# Patient Record
Sex: Female | Born: 1970 | Race: White | Hispanic: No | Marital: Single | Smoking: Current some day smoker
Health system: Southern US, Community
[De-identification: ages and names within clinical notes are randomized; demographics above are authoritative.]

## PROBLEM LIST (undated history)

## (undated) DIAGNOSIS — F32A Depression, unspecified: Secondary | ICD-10-CM

## (undated) DIAGNOSIS — K5792 Diverticulitis of intestine, part unspecified, without perforation or abscess without bleeding: Secondary | ICD-10-CM

## (undated) DIAGNOSIS — S46009A Unspecified injury of muscle(s) and tendon(s) of the rotator cuff of unspecified shoulder, initial encounter: Secondary | ICD-10-CM

## (undated) DIAGNOSIS — N200 Calculus of kidney: Secondary | ICD-10-CM

## (undated) DIAGNOSIS — E079 Disorder of thyroid, unspecified: Secondary | ICD-10-CM

## (undated) DIAGNOSIS — G43909 Migraine, unspecified, not intractable, without status migrainosus: Secondary | ICD-10-CM

## (undated) DIAGNOSIS — F319 Bipolar disorder, unspecified: Secondary | ICD-10-CM

## (undated) DIAGNOSIS — M1990 Unspecified osteoarthritis, unspecified site: Secondary | ICD-10-CM

## (undated) DIAGNOSIS — K219 Gastro-esophageal reflux disease without esophagitis: Secondary | ICD-10-CM

## (undated) DIAGNOSIS — F419 Anxiety disorder, unspecified: Secondary | ICD-10-CM

## (undated) DIAGNOSIS — G4730 Sleep apnea, unspecified: Secondary | ICD-10-CM

## (undated) DIAGNOSIS — M7710 Lateral epicondylitis, unspecified elbow: Secondary | ICD-10-CM

## (undated) DIAGNOSIS — I10 Essential (primary) hypertension: Secondary | ICD-10-CM

## (undated) HISTORY — DX: Diverticulitis of intestine, part unspecified, without perforation or abscess without bleeding: K57.92

## (undated) HISTORY — PX: SLEEVE GASTROPLASTY: SHX1101

## (undated) HISTORY — DX: Calculus of kidney: N20.0

## (undated) HISTORY — DX: Gastro-esophageal reflux disease without esophagitis: K21.9

## (undated) HISTORY — DX: Migraine, unspecified, not intractable, without status migrainosus: G43.909

## (undated) HISTORY — DX: Sleep apnea, unspecified: G47.30

## (undated) HISTORY — DX: Lateral epicondylitis, unspecified elbow: M77.10

## (undated) HISTORY — PX: ABDOMINAL HYSTERECTOMY: SHX81

## (undated) HISTORY — DX: Unspecified injury of muscle(s) and tendon(s) of the rotator cuff of unspecified shoulder, initial encounter: S46.009A

## (undated) HISTORY — PX: COLON SURGERY: SHX602

## (undated) HISTORY — PX: LITHOTRIPSY: SUR834

## (undated) HISTORY — DX: Disorder of thyroid, unspecified: E07.9

## (undated) HISTORY — DX: Unspecified osteoarthritis, unspecified site: M19.90

---

## 2016-01-09 DIAGNOSIS — F319 Bipolar disorder, unspecified: Secondary | ICD-10-CM | POA: Insufficient documentation

## 2016-03-28 DIAGNOSIS — Z9049 Acquired absence of other specified parts of digestive tract: Secondary | ICD-10-CM | POA: Insufficient documentation

## 2016-03-28 HISTORY — DX: Acquired absence of other specified parts of digestive tract: Z90.49

## 2016-05-02 DIAGNOSIS — Z860101 Personal history of adenomatous and serrated colon polyps: Secondary | ICD-10-CM

## 2016-05-02 DIAGNOSIS — Z86010 Personal history of colonic polyps: Secondary | ICD-10-CM

## 2016-05-02 HISTORY — DX: Personal history of adenomatous and serrated colon polyps: Z86.0101

## 2016-05-02 HISTORY — DX: Personal history of colonic polyps: Z86.010

## 2016-05-24 DIAGNOSIS — G4733 Obstructive sleep apnea (adult) (pediatric): Secondary | ICD-10-CM | POA: Insufficient documentation

## 2017-02-02 ENCOUNTER — Encounter: Payer: Self-pay | Admitting: Family Medicine

## 2017-02-02 ENCOUNTER — Ambulatory Visit (INDEPENDENT_AMBULATORY_CARE_PROVIDER_SITE_OTHER): Payer: Medicaid Other | Admitting: Family Medicine

## 2017-02-02 DIAGNOSIS — F319 Bipolar disorder, unspecified: Secondary | ICD-10-CM | POA: Diagnosis not present

## 2017-02-02 DIAGNOSIS — E66811 Obesity, class 1: Secondary | ICD-10-CM

## 2017-02-02 DIAGNOSIS — E669 Obesity, unspecified: Secondary | ICD-10-CM

## 2017-02-02 DIAGNOSIS — G43909 Migraine, unspecified, not intractable, without status migrainosus: Secondary | ICD-10-CM | POA: Insufficient documentation

## 2017-02-02 DIAGNOSIS — K219 Gastro-esophageal reflux disease without esophagitis: Secondary | ICD-10-CM | POA: Insufficient documentation

## 2017-02-02 DIAGNOSIS — G43009 Migraine without aura, not intractable, without status migrainosus: Secondary | ICD-10-CM | POA: Diagnosis not present

## 2017-02-02 DIAGNOSIS — F3341 Major depressive disorder, recurrent, in partial remission: Secondary | ICD-10-CM | POA: Insufficient documentation

## 2017-02-02 DIAGNOSIS — G4700 Insomnia, unspecified: Secondary | ICD-10-CM | POA: Insufficient documentation

## 2017-02-02 DIAGNOSIS — E039 Hypothyroidism, unspecified: Secondary | ICD-10-CM | POA: Diagnosis not present

## 2017-02-02 DIAGNOSIS — F324 Major depressive disorder, single episode, in partial remission: Secondary | ICD-10-CM | POA: Insufficient documentation

## 2017-02-02 HISTORY — DX: Obesity, unspecified: E66.9

## 2017-02-02 HISTORY — DX: Obesity, class 1: E66.811

## 2017-02-02 HISTORY — DX: Hypothyroidism, unspecified: E03.9

## 2017-02-02 NOTE — Progress Notes (Signed)
BP 125/89   Pulse 78   Temp 98.9 F (37.2 C) (Oral)   Ht 5' 7.5" (1.715 m)   Wt 218 lb (98.9 kg)   BMI 33.64 kg/m    Subjective:    Patient ID: Pam Chen, female    DOB: 09/24/1970, 46 y.o.   MRN: [id number]  HPI: Pam Chen is a 46 y.o. female presenting on 02/02/2017 for Establish Care   HPI Hypothyroidism  Patient is coming in to establish with our office Patient is coming in for thyroid check today as well. They deny any issues with hair changes or heat or cold problems or diarrhea or constipation. They deny any chest pain or palpitations. They are currently on levothyroxine 75 micrograms   GERD Patient is currently on omeprazole 20 twice a day and says it controls pretty well.  She denies any major symptoms or abdominal pain or belching or burping. She denies any blood in her stool or lightheadedness or dizziness.   Migraines without aura Patient is coming in to establish care with Dr. office and has a history of migraines and currently takes Topamax 50 and uses Maxalt for breakthrough. She says for the most part that works but occasionally it does cause her a lot more issues and she needs a shot of Toradol. She just recently got one yesterday and is recovering from that and seems to be doing well.  Bipolar and depression Patient has bipolar depression and [facility] [place] and is currently on Wellbutrin and seems to be doing well on it. She does feel little more depressed and stressed right now with a recent move to here and dealing with some family changes. She denies any suicidal ideations or thoughts of hurting herself but does have thoughts of death. Depression screen PHQ 2/9 02/02/2017  Decreased Interest 2  Down, Depressed, Hopeless 2  PHQ - 2 Score 4  Altered sleeping 2  Tired, decreased energy 2  Change in appetite 2  Feeling bad or failure about yourself  3  Trouble concentrating 1  Moving slowly or fidgety/restless 1  Suicidal  thoughts 2  PHQ-9 Score 17  Difficult doing work/chores Somewhat difficult    Relevant past medical, surgical, family and social history reviewed and updated as indicated. Interim medical history since our last visit reviewed. Allergies and medications reviewed and updated.  Review of Systems  Constitutional: Negative for chills and fever.  HENT: Negative for congestion, ear discharge and ear pain.   Eyes: Negative for redness and visual disturbance.  Respiratory: Negative for chest tightness and shortness of breath.   Cardiovascular: Negative for chest pain and leg swelling.  Gastrointestinal: Negative for abdominal pain and nausea.  Genitourinary: Negative for difficulty urinating and dysuria.  Musculoskeletal: Negative for back pain and gait problem.  Skin: Negative for rash.  Neurological: Positive for headaches. Negative for light-headedness.  Psychiatric/Behavioral: Positive for decreased concentration, dysphoric mood and sleep disturbance. Negative for agitation, behavioral problems, self-injury and suicidal ideas. The patient is nervous/anxious.   All other systems reviewed and are negative.   Per HPI unless specifically indicated above  Social History   Social History  . Marital status: Single    Spouse name: N/A  . Number of children: N/A  . Years of education: N/A   Occupational History  . Not on file.   Social History Main Topics  . Smoking status: Current Every Day Smoker    Packs/day: 0.25    Types: Cigarettes  .  Smokeless tobacco: Never Used  . Alcohol use No  . Drug use: No  . Sexual activity: Not on file   Other Topics Concern  . Not on file   Social History Narrative  . No narrative on file    Past Surgical History:  Procedure Laterality Date  . ABDOMINAL HYSTERECTOMY    . COLON SURGERY     diverticulitis  . LITHOTRIPSY    . SLEEVE GASTROPLASTY      Family History  Problem Relation Age of Onset  . Hypertension Father   . Sleep apnea  Father   . Cancer Maternal Grandmother        breast  . Alzheimer's disease Maternal Grandmother   . Diabetes Maternal Grandfather   . Cancer Paternal Grandmother        ovarian  . Hypertension Paternal Grandfather   . Heart disease Paternal Grandfather   . Diabetes Paternal Grandfather   . Hyperlipidemia Brother     Allergies as of 02/02/2017      Reactions   Aspirin Other (See Comments)   Upset tummy.  Can take ibuprofen.   Oxycodone-acetaminophen Anxiety   Can take vicodin      Medication List       Accurate as of 02/02/17  2:46 PM. Always use your most recent med list.          buPROPion 300 MG 24 hr tablet Commonly known as:  WELLBUTRIN XL Take 300 mg by mouth daily.   cyclobenzaprine 10 MG tablet Commonly known as:  FLEXERIL Take 10 mg by mouth 2 (two) times daily.   HM VITAMIN D3 4000 units Caps Generic drug:  Cholecalciferol Take 4,000 Units by mouth daily.   HYDROcodone-acetaminophen 5-325 MG tablet Commonly known as:  NORCO/VICODIN Take 1 tablet by mouth every 8 (eight) hours as needed for moderate pain.   levothyroxine 75 MCG tablet Commonly known as:  SYNTHROID, LEVOTHROID Take 75 mcg by mouth daily.   multivitamin with minerals Tabs tablet Take 1 tablet by mouth daily.   omeprazole 20 MG capsule Commonly known as:  PRILOSEC Take 20 mg by mouth 2 (two) times daily before a meal.   promethazine 12.5 MG tablet Commonly known as:  PHENERGAN Take 12.5 mg by mouth every 6 (six) hours as needed for nausea or vomiting.   rizatriptan 10 MG tablet Commonly known as:  MAXALT Take 10 mg by mouth as needed for migraine. May repeat in 2 hours if needed   topiramate 50 MG tablet Commonly known as:  TOPAMAX Take 100 mg by mouth daily.          Objective:    BP 125/89   Pulse 78   Temp 98.9 F (37.2 C) (Oral)   Ht 5' 7.5" (1.715 m)   Wt 218 lb (98.9 kg)   BMI 33.64 kg/m   Wt Readings from Last 3 Encounters:  02/02/17 218 lb (98.9 kg)      Physical Exam  Constitutional: She is oriented to person, place, and time. She appears well-developed and well-nourished. No distress.  Eyes: Conjunctivae are normal.  Neck: Neck supple. No thyromegaly present.  Cardiovascular: Normal rate, regular rhythm, normal heart sounds and intact distal pulses.   No murmur heard. Pulmonary/Chest: Effort normal and breath sounds normal. No respiratory distress. She has no wheezes. She has no rales.  Abdominal: Soft. Bowel sounds are normal. She exhibits no distension. There is no tenderness. There is no rebound.  Musculoskeletal: Normal range of motion. She exhibits  no edema.  Lymphadenopathy:    She has no cervical adenopathy.  Neurological: She is alert and oriented to person, place, and time. Coordination normal.  Skin: Skin is warm and dry. No rash noted. She is not diaphoretic.  Psychiatric: Her behavior is normal. Her mood appears anxious. She exhibits a depressed mood. She expresses no suicidal ideation. She expresses no suicidal plans.  Nursing note and vitals reviewed.   No results found for this or any previous visit.    Assessment & Plan:   Problem List Items Addressed This Visit      Cardiovascular and Mediastinum   Migraine headache without aura   Relevant Medications   topiramate (TOPAMAX) 50 MG tablet   buPROPion (WELLBUTRIN XL) 300 MG 24 hr tablet   rizatriptan (MAXALT) 10 MG tablet   HYDROcodone-acetaminophen (NORCO/VICODIN) 5-325 MG tablet   cyclobenzaprine (FLEXERIL) 10 MG tablet     Digestive   GERD (gastroesophageal reflux disease)   Relevant Medications   omeprazole (PRILOSEC) 20 MG capsule     Endocrine   Hypothyroid   Relevant Medications   levothyroxine (SYNTHROID, LEVOTHROID) 75 MCG tablet     Other   Obesity (BMI 30.0-34.9)   Bipolar 1 disorder, depressed (HCC)   Relevant Medications   buPROPion (WELLBUTRIN XL) 300 MG 24 hr tablet       Follow up plan: Return in about 4 weeks (around 03/02/2017), or  if symptoms worsen or fail to improve, for thyroid.  Arville Care, MD [facility] 02/02/2017, 2:46 PM

## 2017-02-06 ENCOUNTER — Telehealth: Payer: Self-pay | Admitting: Family Medicine

## 2017-02-06 NOTE — Telephone Encounter (Signed)
Patient aware [facility] [place]

## 2017-02-08 ENCOUNTER — Other Ambulatory Visit: Payer: Self-pay | Admitting: Family Medicine

## 2017-02-08 MED ORDER — RIZATRIPTAN BENZOATE 10 MG PO TABS: 10.0000 mg | ORAL_TABLET | ORAL | 2 refills | Status: DC | PRN

## 2017-02-08 MED ORDER — PROMETHAZINE HCL 12.5 MG PO TABS: 12.5000 mg | ORAL_TABLET | Freq: Four times a day (QID) | ORAL | 2 refills | Status: DC | PRN

## 2017-02-08 NOTE — Telephone Encounter (Signed)
Pt notified of RXs 

## 2017-02-08 NOTE — Telephone Encounter (Signed)
Please review and advise.

## 2017-03-02 ENCOUNTER — Encounter: Payer: Self-pay | Admitting: Family Medicine

## 2017-03-02 ENCOUNTER — Telehealth: Payer: Self-pay | Admitting: Family Medicine

## 2017-03-02 ENCOUNTER — Ambulatory Visit (INDEPENDENT_AMBULATORY_CARE_PROVIDER_SITE_OTHER): Payer: Medicaid Other | Admitting: Family Medicine

## 2017-03-02 VITALS — BP 128/87 | HR 88 | Temp 98.0°F | Ht 67.5 in | Wt 223.0 lb

## 2017-03-02 DIAGNOSIS — F1120 Opioid dependence, uncomplicated: Secondary | ICD-10-CM | POA: Diagnosis not present

## 2017-03-02 DIAGNOSIS — Z79899 Other long term (current) drug therapy: Secondary | ICD-10-CM | POA: Diagnosis not present

## 2017-03-02 DIAGNOSIS — E039 Hypothyroidism, unspecified: Secondary | ICD-10-CM | POA: Diagnosis not present

## 2017-03-02 DIAGNOSIS — Z23 Encounter for immunization: Secondary | ICD-10-CM

## 2017-03-02 DIAGNOSIS — Z13220 Encounter for screening for lipoid disorders: Secondary | ICD-10-CM | POA: Diagnosis not present

## 2017-03-02 MED ORDER — HYDROCODONE-ACETAMINOPHEN 5-325 MG PO TABS: 1.0000 | ORAL_TABLET | Freq: Every day | ORAL | 0 refills | Status: DC | PRN

## 2017-03-02 MED ORDER — CYCLOBENZAPRINE HCL 10 MG PO TABS
10.0000 mg | ORAL_TABLET | Freq: Two times a day (BID) | ORAL | 5 refills | Status: DC
Start: 2017-03-02 — End: 2017-10-02

## 2017-03-02 NOTE — Progress Notes (Signed)
[place] Controlled Substance Abuse database reviewed- Yes If yes- were their any concerning findings : no Depression screen [facility] 2/9 03/02/2017 02/02/2017  Decreased Interest 1 2  Down, Depressed, Hopeless 2 2  PHQ - 2 Score 3 4  Altered sleeping 2 2  Tired, decreased energy 2 2  Change in appetite 1 2  Feeling bad or failure about yourself  2 3  Trouble concentrating 0 1  Moving slowly or fidgety/restless 0 1  Suicidal thoughts 1 2  PHQ-9 Score 11 17  Difficult doing work/chores Somewhat difficult Somewhat difficult    No flowsheet data found.   [employer] drug screen performed- Yes  SOAPP  0= never  1= seldom  2=sometimes  3= often  4= very often  How often do you have mood swings? 4 How often do you smoke a cigarette within an hour after waling up? 1 How often have you taken medication other than the way that it was prescribed?1 How often have you used illegal drugs in the past 5 years? 0 How often, in your lifetime, have you had legal problems or been arrested? 0  Score 6  Alcohol Audit - How often during the last year have found that you: 0-Never   1- Less than monthly   2- Monthly     3-Weekly     4-daily or almost daily  - found that you were not able to stop drinking once you started- 0 -failed to do what was normally expected of you because of drinking- 0 -needed a first drink in the morning- 0 -had a feeling of guilt or remorse after drinking- 0 -are/were unable to remember what happened the night before because of your drinking- 0  0- NO   2- yes but not in last year  4- yes during last year -Have you or someone else been injured because of your drinking- 0 - Has anyone been concerned about your drinking or suggested you cut down- 0        TOTAL- 0  ( 0-7- alcohol education, 8-15- simple advice, 16-19 simple advice plus counseling, 20-40 referral for evaluation and treatment 0   [facility] [place]  Pain assessment: Cause of pain- lower  back, degenerative Pain location- low back Pain on scale of 1-10- 2 Frequency- every other day What increases pain-movement and stress What makes pain Better-medication and stretching Effects on ADL - sometimes but not all of the time  Prior treatments tried and failed- therapy, chiropracter Current treatments- hydrocodone Morphine mg equivalent- 5  Pain management agreement reviewed and signed- Yes

## 2017-03-02 NOTE — Telephone Encounter (Signed)
Please advise 

## 2017-03-02 NOTE — Telephone Encounter (Signed)
Notified of RX

## 2017-03-03 LAB — CBC WITH DIFFERENTIAL/PLATELET
BASOS ABS: 0 10*3/uL (ref 0.0–0.2)
Basos: 0 %
EOS (ABSOLUTE): 0.1 10*3/uL (ref 0.0–0.4)
EOS: 1 %
HEMATOCRIT: 41.6 % (ref 34.0–46.6)
Hemoglobin: 13.2 g/dL (ref 11.1–15.9)
IMMATURE GRANS (ABS): 0 10*3/uL (ref 0.0–0.1)
IMMATURE GRANULOCYTES: 0 %
LYMPHS: 26 %
Lymphocytes Absolute: 2 10*3/uL (ref 0.7–3.1)
MCH: 28.8 pg (ref 26.6–33.0)
MCHC: 31.7 g/dL (ref 31.5–35.7)
MCV: 91 fL (ref 79–97)
Monocytes Absolute: 0.5 10*3/uL (ref 0.1–0.9)
Monocytes: 7 %
NEUTROS PCT: 66 %
Neutrophils Absolute: 5.3 10*3/uL (ref 1.4–7.0)
Platelets: 258 10*3/uL (ref 150–379)
RBC: 4.59 x10E6/uL (ref 3.77–5.28)
RDW: 13.5 % (ref 12.3–15.4)
WBC: 8 10*3/uL (ref 3.4–10.8)

## 2017-03-03 LAB — CMP14+EGFR
A/G RATIO: 1.5 (ref 1.2–2.2)
ALT: 13 IU/L (ref 0–32)
AST: 15 IU/L (ref 0–40)
Albumin: 4 g/dL (ref 3.5–5.5)
Alkaline Phosphatase: 80 IU/L (ref 39–117)
BILIRUBIN TOTAL: 0.2 mg/dL (ref 0.0–1.2)
BUN/Creatinine Ratio: 18 (ref 9–23)
BUN: 15 mg/dL (ref 6–24)
CHLORIDE: 101 mmol/L (ref 96–106)
CO2: 29 mmol/L (ref 20–29)
Calcium: 9.8 mg/dL (ref 8.7–10.2)
Creatinine, Ser: 0.85 mg/dL (ref 0.57–1.00)
GFR calc non Af Amer: 84 mL/min/{1.73_m2} (ref >59)
GFR, EST AFRICAN AMERICAN: 96 mL/min/{1.73_m2} (ref >59)
GLUCOSE: 74 mg/dL (ref 65–99)
Globulin, Total: 2.6 g/dL (ref 1.5–4.5)
POTASSIUM: 4.9 mmol/L (ref 3.5–5.2)
SODIUM: 139 mmol/L (ref 134–144)
TOTAL PROTEIN: 6.6 g/dL (ref 6.0–8.5)

## 2017-03-03 LAB — LIPID PANEL
Chol/HDL Ratio: 5.4 ratio — ABNORMAL HIGH (ref 0.0–4.4)
Cholesterol, Total: 228 mg/dL — ABNORMAL HIGH (ref 100–199)
HDL: 42 mg/dL (ref >39)
LDL Calculated: 143 mg/dL — ABNORMAL HIGH (ref 0–99)
Triglycerides: 215 mg/dL — ABNORMAL HIGH (ref 0–149)
VLDL Cholesterol Cal: 43 mg/dL — ABNORMAL HIGH (ref 5–40)

## 2017-03-03 LAB — TSH: TSH: 2.62 u[IU]/mL (ref 0.450–4.500)

## 2017-03-07 ENCOUNTER — Encounter: Payer: Self-pay | Admitting: Family

## 2017-03-07 ENCOUNTER — Ambulatory Visit (INDEPENDENT_AMBULATORY_CARE_PROVIDER_SITE_OTHER): Payer: Medicaid Other | Admitting: Family

## 2017-03-07 VITALS — BP 122/84 | HR 77 | Temp 98.6°F | Ht 67.5 in | Wt 222.2 lb

## 2017-03-07 DIAGNOSIS — E039 Hypothyroidism, unspecified: Secondary | ICD-10-CM

## 2017-03-07 DIAGNOSIS — F17200 Nicotine dependence, unspecified, uncomplicated: Secondary | ICD-10-CM

## 2017-03-07 DIAGNOSIS — Z01419 Encounter for gynecological examination (general) (routine) without abnormal findings: Secondary | ICD-10-CM

## 2017-03-07 DIAGNOSIS — Z0000 Encounter for general adult medical examination without abnormal findings: Secondary | ICD-10-CM

## 2017-03-07 DIAGNOSIS — E669 Obesity, unspecified: Secondary | ICD-10-CM

## 2017-03-07 DIAGNOSIS — K219 Gastro-esophageal reflux disease without esophagitis: Secondary | ICD-10-CM

## 2017-03-07 HISTORY — DX: Nicotine dependence, unspecified, uncomplicated: F17.200

## 2017-03-07 MED ORDER — LEVOTHYROXINE SODIUM 75 MCG PO TABS: 75.0000 ug | ORAL_TABLET | Freq: Every day | ORAL | 2 refills | Status: DC

## 2017-03-07 MED ORDER — OMEPRAZOLE 20 MG PO CPDR: 20.0000 mg | DELAYED_RELEASE_CAPSULE | Freq: Two times a day (BID) | ORAL | 1 refills | Status: DC

## 2017-03-07 NOTE — Progress Notes (Signed)
Subjective:    Patient ID: Pam Chen, female    DOB: May 13, 1971, 46 y.o.   MRN: [id number]  Pt presents to the office today for CPE with PAP. Pt is followed by Dr. Louanne Skye for her chronic problems and had lab work on drawn on 03/02/17.  Gynecologic Exam  The patient's pertinent negatives include no genital itching, genital odor, missed menses or vaginal discharge. The patient is experiencing no pain. Associated symptoms include constipation and frequency. Pertinent negatives include no back pain, diarrhea, flank pain, urgency or vomiting. She has tried nothing for the symptoms.  Thyroid Problem  Presents for follow-up visit. Symptoms include constipation. Patient reports no anxiety, diarrhea, fatigue or hoarse voice. The symptoms have been stable.  Gastroesophageal Reflux  She complains of heartburn. She reports no belching, no coughing or no hoarse voice. This is a chronic problem. The current episode started more than 1 year ago. The problem occurs occasionally. The problem has been waxing and waning. The heartburn duration is several minutes. The heartburn does not wake her from sleep. Pertinent negatives include no fatigue. She has tried a PPI for the symptoms. The treatment provided moderate relief.      Review of Systems  Constitutional: Negative for fatigue.  HENT: Negative for hoarse voice.   Respiratory: Negative for cough.   Gastrointestinal: Positive for constipation and heartburn. Negative for diarrhea and vomiting.  Genitourinary: Positive for frequency. Negative for flank pain, missed menses, urgency and vaginal discharge.  Musculoskeletal: Negative for back pain.  Psychiatric/Behavioral: The patient is not nervous/anxious.   All other systems reviewed and are negative.      Objective:   Physical Exam  Constitutional: She is oriented to person, place, and time. She appears well-developed and well-nourished. No distress.  HENT:  Head: Normocephalic and atraumatic.    Right Ear: External ear normal.  Left Ear: External ear normal.  Nose: Nose normal.  Mouth/Throat: Oropharynx is clear and moist.  Eyes: Pupils are equal, round, and reactive to light.  Neck: Normal range of motion. Neck supple. No thyromegaly present.  Cardiovascular: Normal rate, regular rhythm, normal heart sounds and intact distal pulses.   No murmur heard. Pulmonary/Chest: Effort normal and breath sounds normal. No respiratory distress. She has no wheezes. Right breast exhibits no inverted nipple, no mass, no nipple discharge, no skin change and no tenderness. Left breast exhibits no inverted nipple, no mass, no nipple discharge, no skin change and no tenderness. Breasts are symmetrical.  Abdominal: Soft. Bowel sounds are normal. She exhibits no distension. There is no tenderness.  Genitourinary: Vagina normal.  Genitourinary Comments: Bimanual exam- no adnexal masses or tenderness, ovaries nonpalpable   Cervix not present- No discharge  Musculoskeletal: Normal range of motion. She exhibits no edema or tenderness.  Neurological: She is alert and oriented to person, place, and time.  Skin: Skin is warm and dry.  Psychiatric: She has a normal mood and affect. Her behavior is normal. Judgment and thought content normal.  Vitals reviewed.     Temp 98.6 F (37 C) (Oral)   Ht 5' 7.5" (1.715 m)   Wt 222 lb 3.2 oz (100.8 kg)   BMI 34.29 kg/m      Assessment & Plan:  1. Annual physical exam - Pap IG, CT/NG w/ reflex HPV when ASC-U  2. Gastroesophageal reflux disease without esophagitis - omeprazole (PRILOSEC) 20 MG capsule; Take 1 capsule (20 mg total) by mouth 2 (two) times daily before a meal.  Dispense: 90 capsule; Refill:  1  3. Acquired hypothyroidism - levothyroxine (SYNTHROID, LEVOTHROID) 75 MCG tablet; Take 1 tablet (75 mcg total) by mouth daily.  Dispense: 90 tablet; Refill: 2  4. Obesity (BMI 30.0-34.9)  5. Current smoker Smoking cessation discussed  6. Encounter  for gynecological examination without abnormal finding - Pap IG, CT/NG w/ reflex HPV when ASC-U   Continue all meds Labs pending Health Maintenance reviewed- Mammogram scheduled  Diet and exercise encouraged RTO as needed and keep follow up with PCP  Jannifer Rodney, FNP

## 2017-03-07 NOTE — Patient Instructions (Signed)

## 2017-03-08 LAB — TOXASSURE SELECT 13 (MW), URINE

## 2017-03-09 LAB — PAP IG, CT-NG, RFX HPV ASCU
CHLAMYDIA, NUC. ACID AMP: NEGATIVE
GONOCOCCUS BY NUCLEIC ACID AMP: NEGATIVE
PAP SMEAR COMMENT: 0

## 2017-03-15 ENCOUNTER — Telehealth: Payer: Self-pay | Admitting: Family Medicine

## 2017-03-15 NOTE — Telephone Encounter (Signed)
What is the name of the medication? rizatriptan (MAXALT) 10 MG tablet topiramate (TOPAMAX) 50 MG tablet promethazine (PHENERGAN) 12.5 MG tablet  Have you contacted your pharmacy to request a refill? no  [facility] [place]   Patient notified that their request is being sent to the clinical staff for review and that they should receive a call once it is complete. If they do not receive a call within 24 hours they can check with their pharmacy or our office.

## 2017-03-19 MED ORDER — PROMETHAZINE HCL 12.5 MG PO TABS
12.5000 mg | ORAL_TABLET | Freq: Four times a day (QID) | ORAL | 2 refills | Status: DC | PRN
Start: 2017-03-19 — End: 2017-07-13

## 2017-03-19 MED ORDER — RIZATRIPTAN BENZOATE 10 MG PO TABS: 10.0000 mg | ORAL_TABLET | ORAL | 2 refills | Status: DC | PRN

## 2017-03-19 MED ORDER — TOPIRAMATE 50 MG PO TABS: 100.0000 mg | ORAL_TABLET | Freq: Every day | ORAL | 2 refills | Status: DC

## 2017-03-19 NOTE — Telephone Encounter (Signed)
Go ahead and send refills for 3 months

## 2017-03-19 NOTE — Telephone Encounter (Signed)
Done

## 2017-05-01 ENCOUNTER — Encounter: Payer: Self-pay | Admitting: Family Medicine

## 2017-05-01 ENCOUNTER — Telehealth: Payer: Self-pay | Admitting: Family Medicine

## 2017-05-01 ENCOUNTER — Ambulatory Visit: Payer: Medicaid Other | Admitting: Family Medicine

## 2017-05-01 VITALS — BP 130/85 | HR 99 | Temp 98.2°F | Ht 67.5 in | Wt 226.0 lb

## 2017-05-01 DIAGNOSIS — E785 Hyperlipidemia, unspecified: Secondary | ICD-10-CM | POA: Insufficient documentation

## 2017-05-01 DIAGNOSIS — N3281 Overactive bladder: Secondary | ICD-10-CM | POA: Diagnosis not present

## 2017-05-01 NOTE — Telephone Encounter (Signed)
[facility] called to make sure you aware that this patient was prescribed Diazepam 5 mg, one tablet tid, #90, with no refills on 04/23/17 by Dr. [facility]re[facility].

## 2017-05-01 NOTE — Progress Notes (Signed)
   BP 130/85   Pulse 99   Temp 98.2 F (36.8 C) (Oral)   Ht 5' 7.5" (1.715 m)   Wt 226 lb (102.5 kg)   BMI 34.87 kg/m    Subjective:    Patient ID: Pam Chen, female    DOB: 01/31/1971, [age] y.o.   MRN: [id number]  HPI: Pam Chen is a 46 y.o. female presenting on 05/01/2017 for Medication Refill (Hydrocodone)   HPI Bladder incontinence and frequency issues Patient has tried medications previously but has been quite some time and she cannot recall any recently that she has tried.  This is been an issue that she is been fighting with for years.  She denies any burning or pain with urination or vaginal itching or discharge.  She denies any odor or hematuria.  She just has some leakage and frequency.  She also complains of some urgency as well.  Relevant past medical, surgical, family and social history reviewed and updated as indicated. Interim medical history since our last visit reviewed. Allergies and medications reviewed and updated.  Review of Systems  Constitutional: Negative for chills and fever.  Eyes: Negative for visual disturbance.  Respiratory: Negative for chest tightness and shortness of breath.   Cardiovascular: Negative for chest pain and leg swelling.  Gastrointestinal: Negative for abdominal pain.  Genitourinary: Positive for frequency and urgency. Negative for decreased urine volume, difficulty urinating, dysuria, flank pain, hematuria, vaginal bleeding, vaginal discharge and vaginal pain.  Musculoskeletal: Negative for back pain and gait problem.  Skin: Negative for rash.  Neurological: Negative for light-headedness and headaches.  Psychiatric/Behavioral: Negative for agitation and behavioral problems.  All other systems reviewed and are negative.   Per HPI unless specifically indicated above    Objective:    BP 130/85   Pulse 99   Temp 98.2 F (36.8 C) (Oral)   Ht 5' 7.5" (1.715 m)   Wt 226 lb (102.5 kg)   BMI 34.87 kg/m   Wt Readings from Last 3  Encounters:  05/01/17 226 lb (102.5 kg)  03/07/17 222 lb 3.2 oz (100.8 kg)  03/02/17 223 lb (101.2 kg)    Physical Exam  Constitutional: She is oriented to person, place, and time. She appears well-developed and well-nourished. No distress.  Eyes: Conjunctivae are normal.  Cardiovascular: Normal rate, regular rhythm, normal heart sounds and intact distal pulses.  No murmur heard. Pulmonary/Chest: Effort normal and breath sounds normal. No respiratory distress. She has no wheezes. She has no rales.  Abdominal: Soft. Bowel sounds are normal. She exhibits no distension. There is no tenderness. There is no rebound and no guarding.  Musculoskeletal: Normal range of motion.  Neurological: She is alert and oriented to person, place, and time. Coordination normal.  Skin: Skin is warm and dry. No rash noted. She is not diaphoretic.  Psychiatric: She has a normal mood and affect. Her behavior is normal.  Nursing note and vitals reviewed.       Assessment & Plan:   Problem List Items Addressed This Visit    None    Visit Diagnoses    Overactive bladder    -  Primary   Gave samples for Myrbetriq       Follow up plan: Return if symptoms worsen or fail to improve.  Counseling provided for all of the vaccine components No orders of the defined types were placed in this encounter.   Arville CareJoshua [name], MD [facility] 05/01/2017, 9:28 AM

## 2017-05-02 ENCOUNTER — Telehealth: Payer: Self-pay | Admitting: Family Medicine

## 2017-05-02 ENCOUNTER — Other Ambulatory Visit: Payer: Self-pay | Admitting: Family Medicine

## 2017-05-02 NOTE — Telephone Encounter (Signed)
I called and talked to the pharmacy and it should be authorized now for her

## 2017-05-02 NOTE — Telephone Encounter (Signed)
I called the pharmacy and clarified and they will release 1 of the held prescription that they have there for her.

## 2017-05-02 NOTE — Telephone Encounter (Signed)
Pt has called and states she is a lot of pain and is needing dr Museum/gallery exhibitions officerdettinger to approve the medication

## 2017-05-02 NOTE — Telephone Encounter (Signed)
Okay thanks we will discuss at her next visit the risks of taking both, I think we darty discussed this once.

## 2017-05-16 ENCOUNTER — Other Ambulatory Visit: Payer: Self-pay | Admitting: Family Medicine

## 2017-05-31 ENCOUNTER — Telehealth: Payer: Self-pay | Admitting: Family Medicine

## 2017-05-31 NOTE — Telephone Encounter (Signed)
Pt is out of her hydrocodone Did take a Maxalt this morning which did not help, will repeat If this has not helped she has been instructed to call back to make an appt d/t to her pain contract and her last appt was 3 months ago

## 2017-06-07 ENCOUNTER — Other Ambulatory Visit: Payer: Self-pay | Admitting: Family

## 2017-06-07 DIAGNOSIS — K219 Gastro-esophageal reflux disease without esophagitis: Secondary | ICD-10-CM

## 2017-06-12 ENCOUNTER — Ambulatory Visit: Payer: Medicaid Other | Admitting: Family

## 2017-06-12 ENCOUNTER — Encounter: Payer: Self-pay | Admitting: Family

## 2017-06-12 VITALS — BP 139/87 | HR 84 | Temp 97.3°F | Ht 67.5 in | Wt 211.0 lb

## 2017-06-12 DIAGNOSIS — F17200 Nicotine dependence, unspecified, uncomplicated: Secondary | ICD-10-CM

## 2017-06-12 DIAGNOSIS — G43109 Migraine with aura, not intractable, without status migrainosus: Secondary | ICD-10-CM

## 2017-06-12 MED ORDER — KETOROLAC TROMETHAMINE 60 MG/2ML IM SOLN
60.0000 mg | Freq: Once | INTRAMUSCULAR | Status: AC
  Administered 2017-06-12: 60 mg via INTRAMUSCULAR

## 2017-06-12 MED ORDER — PROMETHAZINE HCL 25 MG/ML IJ SOLN
12.5000 mg | Freq: Once | INTRAMUSCULAR | Status: AC
  Administered 2017-06-12: 12.5 mg via INTRAVENOUS

## 2017-06-12 NOTE — Patient Instructions (Signed)
Migraine Headache A migraine headache is an intense, throbbing pain on one side or both sides of the head. Migraines may also cause other symptoms, such as nausea, vomiting, and sensitivity to light and noise. What are the causes? Doing or taking certain things may also trigger migraines, such as:  Alcohol.  Smoking.  Medicines, such as: ? Medicine used to treat chest pain (nitroglycerine). ? Birth control pills. ? Estrogen pills. ? Certain blood pressure medicines.  Aged cheeses, chocolate, or caffeine.  Foods or drinks that contain nitrates, glutamate, aspartame, or tyramine.  Physical activity.  Other things that may trigger a migraine include:  Menstruation.  Pregnancy.  Hunger.  Stress, lack of sleep, too much sleep, or fatigue.  Weather changes.  What increases the risk? The following factors may make you more likely to experience migraine headaches:  Age. Risk increases with age.  Family history of migraine headaches.  Being Caucasian.  Depression and anxiety.  Obesity.  Being a woman.  Having a hole in the heart (patent foramen ovale) or other heart problems.  What are the signs or symptoms? The main symptom of this condition is pulsating or throbbing pain. Pain may:  Happen in any area of the head, such as on one side or both sides.  Interfere with daily activities.  Get worse with physical activity.  Get worse with exposure to bright lights or loud noises.  Other symptoms may include:  Nausea.  Vomiting.  Dizziness.  General sensitivity to bright lights, loud noises, or smells.  Before you get a migraine, you may get warning signs that a migraine is developing (aura). An aura may include:  Seeing flashing lights or having blind spots.  Seeing bright spots, halos, or zigzag lines.  Having tunnel vision or blurred vision.  Having numbness or a tingling feeling.  Having trouble talking.  Having muscle weakness.  How is this  diagnosed? A migraine headache can be diagnosed based on:  Your symptoms.  A physical exam.  Tests, such as CT scan or MRI of the head. These imaging tests can help rule out other causes of headaches.  Taking fluid from the spine (lumbar puncture) and analyzing it (cerebrospinal fluid analysis, or CSF analysis).  How is this treated? A migraine headache is usually treated with medicines that:  Relieve pain.  Relieve nausea.  Prevent migraines from coming back.  Treatment may also include:  Acupuncture.  Lifestyle changes like avoiding foods that trigger migraines.  Follow these instructions at home: Medicines  Take over-the-counter and prescription medicines only as told by your health care provider.  Do not drive or use heavy machinery while taking prescription pain medicine.  To prevent or treat constipation while you are taking prescription pain medicine, your health care provider may recommend that you: ? Drink enough fluid to keep your urine clear or pale yellow. ? Take over-the-counter or prescription medicines. ? Eat foods that are high in fiber, such as fresh fruits and vegetables, whole grains, and beans. ? Limit foods that are high in fat and processed sugars, such as fried and sweet foods. Lifestyle  Avoid alcohol use.  Do not use any products that contain nicotine or tobacco, such as cigarettes and e-cigarettes. If you need help quitting, ask your health care provider.  Get at least 8 hours of sleep every night.  Limit your stress. General instructions   Keep a journal to find out what may trigger your migraine headaches. For example, write down: ? What you eat and   drink. ? How much sleep you get. ? Any change to your diet or medicines.  If you have a migraine: ? Avoid things that make your symptoms worse, such as bright lights. ? It may help to lie down in a dark, quiet room. ? Do not drive or use heavy machinery. ? Ask your health care provider  what activities are safe for you while you are experiencing symptoms.  Keep all follow-up visits as told by your health care provider. This is important. Contact a health care provider if:  You develop symptoms that are different or more severe than your usual migraine symptoms. Get help right away if:  Your migraine becomes severe.  You have a fever.  You have a stiff neck.  You have vision loss.  Your muscles feel weak or like you cannot control them.  You start to lose your balance often.  You develop trouble walking.  You faint. This information is not intended to replace advice given to you by your health care provider. Make sure you discuss any questions you have with your health care provider. Document Released: 05/15/2005 Document Revised: 12/03/2015 Document Reviewed: 11/01/2015 Elsevier Interactive Patient Education  2017 [employer]   

## 2017-06-12 NOTE — Addendum Note (Signed)
Addended by: Jannifer RodneyHAWKS, Chirstina Haan A on: 06/12/2017 12:06 PM   Modules accepted: Orders

## 2017-06-12 NOTE — Progress Notes (Addendum)
   Subjective:    Patient ID: Pam Chen, female    DOB: 05/23/1971, 47 y.o.   MRN: [id number]  Migraine   This is a chronic problem. The current episode started yesterday. The problem occurs constantly (headaches daily and migraine 2-3 a week). The pain is located in the frontal region. The pain does not radiate. The pain quality is similar to prior headaches. The quality of the pain is described as aching. The pain is at a severity of 7/10. The pain is moderate. Associated symptoms include nausea, phonophobia, photophobia and vomiting. The symptoms are aggravated by emotional stress. She has tried beta blockers, acetaminophen, NSAIDs, oral narcotics and Excedrin for the symptoms. Her past medical history is significant for migraine headaches.      Review of Systems  Eyes: Positive for photophobia.  Gastrointestinal: Positive for nausea and vomiting.  All other systems reviewed and are negative.      Objective:   Physical Exam  Constitutional: She is oriented to person, place, and time. She appears well-developed and well-nourished. No distress.  HENT:  Head: Normocephalic.  Eyes: Pupils are equal, round, and reactive to light.  Cardiovascular: Normal rate, regular rhythm, normal heart sounds and intact distal pulses.  No murmur heard. Pulmonary/Chest: Effort normal and breath sounds normal. No respiratory distress. She has no wheezes.  Abdominal: Soft. Bowel sounds are normal. She exhibits no distension. There is no tenderness.  Musculoskeletal: Normal range of motion. She exhibits no edema or tenderness.  Neurological: She is alert and oriented to person, place, and time.  Skin: Skin is warm and dry.  Psychiatric: She has a normal mood and affect. Her behavior is normal. Judgment and thought content normal.  Vitals reviewed.     BP 139/87   Pulse 84   Temp (!) 97.3 F (36.3 C) (Oral)   Ht 5' 7.5" (1.715 m)   Wt 211 lb (95.7 kg)   BMI 32.56 kg/m      Assessment & Plan:   1. Migraine with aura and without status migrainosus, not intractable Continue Topamax Avoid stress Encourage 8 hours of sleep Will do referral to Neurologists  - Ambulatory referral to Neurology - ketorolac (TORADOL) injection 60 mg - promethazine (PHENERGAN) injection 12.5 mg   2. Current smoker Smoking cessation discused     Jannifer Rodneyhristy Siria Calandro, FNP

## 2017-06-14 ENCOUNTER — Other Ambulatory Visit: Payer: Self-pay | Admitting: Family Medicine

## 2017-06-22 ENCOUNTER — Ambulatory Visit: Payer: Medicaid Other | Admitting: Family Medicine

## 2017-06-22 ENCOUNTER — Encounter: Payer: Self-pay | Admitting: Family Medicine

## 2017-06-22 VITALS — BP 133/82 | HR 84 | Temp 98.2°F | Ht 67.5 in | Wt 213.0 lb

## 2017-06-22 DIAGNOSIS — M545 Low back pain: Secondary | ICD-10-CM

## 2017-06-22 DIAGNOSIS — J40 Bronchitis, not specified as acute or chronic: Secondary | ICD-10-CM

## 2017-06-22 DIAGNOSIS — G8929 Other chronic pain: Secondary | ICD-10-CM

## 2017-06-22 MED ORDER — HYDROCODONE-ACETAMINOPHEN 5-325 MG PO TABS: 1.0000 | ORAL_TABLET | Freq: Every day | ORAL | 0 refills | Status: DC | PRN

## 2017-06-22 MED ORDER — PREDNISONE 20 MG PO TABS: ORAL_TABLET | ORAL | 0 refills | Status: DC

## 2017-06-26 NOTE — Progress Notes (Signed)
BP 133/82   Pulse 84   Temp 98.2 F (36.8 C) (Oral)   Ht 5' 7.5" (1.715 m)   Wt 213 lb (96.6 kg)   BMI 32.87 kg/m    Subjective:    Patient ID: Pam Chen, female    DOB: 03/13/1971, 47 y.o.   MRN: [id number]  HPI: Pam Chen is a 47 y.o. female presenting on 06/22/2017 for Medication Refill (Hydrocodone) and Cough, sinus and chest congestion (began last weekend)   HPI Cough and chest congestion Patient is coming in with complaints of sinus congestion and cough and chest congestion that began about 5 days ago.  She denies any fevers or chills or shortness of breath or wheezing but does have that chest congestion.  Patient has been using over-the-counter Tylenol Sinus and cold medicine and Alka-Seltzer without much success.  She does frequently fight allergies especially this time year and has been on medication for that.  Patient is coming for refill for low back pain She uses hydrocodone as needed and is coming in for refill, she uses it about once to twice a day and it has been working well for her.  The pain is bilateral lumbar pain and she denies any radicular symptoms or shooting pain anywhere else.  She rates it as a 5 out of 10.  Relevant past medical, surgical, family and social history reviewed and updated as indicated. Interim medical history since our last visit reviewed. Allergies and medications reviewed and updated.  Review of Systems  Constitutional: Negative for chills and fever.  HENT: Positive for congestion, postnasal drip, rhinorrhea, sinus pressure and sore throat. Negative for ear discharge, ear pain and sneezing.   Respiratory: Positive for cough. Negative for chest tightness, shortness of breath and wheezing.   Cardiovascular: Negative for chest pain and leg swelling.  Musculoskeletal: Positive for back pain. Negative for gait problem.  Skin: Negative for rash.  Neurological: Negative for light-headedness and headaches.  Psychiatric/Behavioral: Negative  for agitation and behavioral problems.  All other systems reviewed and are negative.   Per HPI unless specifically indicated above   Allergies as of 06/22/2017      Reactions   Aspirin Other (See Comments)   Upset tummy.  Can take ibuprofen.   Oxycodone-acetaminophen Anxiety   Can take vicodin   Tramadol Hcl Nausea Only      Medication List        Accurate as of 06/22/17 11:59 PM. Always use your most recent med list.          buPROPion 300 MG 24 hr tablet Commonly known as:  WELLBUTRIN XL Take 300 mg by mouth daily.   cyclobenzaprine 10 MG tablet Commonly known as:  FLEXERIL Take 1 tablet (10 mg total) by mouth 2 (two) times daily.   diazepam 5 MG tablet Commonly known as:  VALIUM Take 5 mg by mouth 3 (three) times daily.   HM VITAMIN D3 4000 units Caps Generic drug:  Cholecalciferol Take 4,000 Units by mouth daily.   HYDROcodone-acetaminophen 5-325 MG tablet Commonly known as:  NORCO/VICODIN Take 1 tablet by mouth daily as needed for moderate pain. Do not refill until 60 days from prescription date   HYDROcodone-acetaminophen 5-325 MG tablet Commonly known as:  NORCO/VICODIN Take 1 tablet by mouth daily as needed for moderate pain. Do not refill until 30 days from prescription date   HYDROcodone-acetaminophen 5-325 MG tablet Commonly known as:  NORCO/VICODIN Take 1 tablet by mouth daily as needed for moderate pain.  levothyroxine 75 MCG tablet Commonly known as:  SYNTHROID, LEVOTHROID Take 1 tablet (75 mcg total) by mouth daily.   multivitamin with minerals Tabs tablet Take 1 tablet by mouth daily.   omeprazole 20 MG capsule Commonly known as:  PRILOSEC TAKE 1 CAPSULE (20 MG TOTAL) BY MOUTH 2 (TWO) TIMES DAILY BEFORE A MEAL.   predniSONE 20 MG tablet Commonly known as:  DELTASONE 2 po at same time daily for 5 days   promethazine 12.5 MG tablet Commonly known as:  PHENERGAN Take 1 tablet (12.5 mg total) by mouth every 6 (six) hours as needed for  nausea or vomiting.   rizatriptan 10 MG tablet Commonly known as:  MAXALT TAKE 1 TABLET (10 MG TOTAL) BY MOUTH AS NEEDED FOR MIGRAINE. MAY REPEAT IN 2 HOURS IF NEEDED   topiramate 50 MG tablet Commonly known as:  TOPAMAX TAKE 2 TABLETS BY MOUTH EVERY DAY          Objective:    BP 133/82   Pulse 84   Temp 98.2 F (36.8 C) (Oral)   Ht 5' 7.5" (1.715 m)   Wt 213 lb (96.6 kg)   BMI 32.87 kg/m   Wt Readings from Last 3 Encounters:  06/22/17 213 lb (96.6 kg)  06/12/17 211 lb (95.7 kg)  05/01/17 226 lb (102.5 kg)    Physical Exam  Constitutional: She is oriented to person, place, and time. She appears well-developed and well-nourished. No distress.  HENT:  Right Ear: Tympanic membrane, external ear and ear canal normal.  Left Ear: Tympanic membrane, external ear and ear canal normal.  Nose: Mucosal edema and rhinorrhea present. No epistaxis. Right sinus exhibits no maxillary sinus tenderness and no frontal sinus tenderness. Left sinus exhibits no maxillary sinus tenderness and no frontal sinus tenderness.  Mouth/Throat: Uvula is midline and mucous membranes are normal. Posterior oropharyngeal edema and posterior oropharyngeal erythema present. No oropharyngeal exudate or tonsillar abscesses.  Eyes: Conjunctivae are normal.  Neck: Neck supple. No thyromegaly present.  Cardiovascular: Normal rate, regular rhythm, normal heart sounds and intact distal pulses.  No murmur heard. Pulmonary/Chest: Effort normal and breath sounds normal. No respiratory distress. She has no wheezes. She has no rales.  Musculoskeletal: Normal range of motion. She exhibits no edema.       Lumbar back: She exhibits tenderness (Lumbar tenderness in a bandlike area, negative straight leg raise bilaterally). She exhibits normal range of motion and no bony tenderness.  Lymphadenopathy:    She has no cervical adenopathy.  Neurological: She is alert and oriented to person, place, and time. Coordination normal.    Skin: Skin is warm and dry. No rash noted. She is not diaphoretic.  Psychiatric: She has a normal mood and affect. Her behavior is normal.  Vitals reviewed.       Assessment & Plan:   Problem List Items Addressed This Visit    None    Visit Diagnoses    Bronchitis    -  Primary   Chronic bilateral low back pain without sciatica       Relevant Medications   HYDROcodone-acetaminophen (NORCO/VICODIN) 5-325 MG tablet   HYDROcodone-acetaminophen (NORCO/VICODIN) 5-325 MG tablet   HYDROcodone-acetaminophen (NORCO/VICODIN) 5-325 MG tablet   predniSONE (DELTASONE) 20 MG tablet       Follow up plan: Return in about 3 months (around 09/20/2017), or if symptoms worsen or fail to improve, for Recheck migraines.  Counseling provided for all of the vaccine components No orders of the defined types were  placed in this encounter.  Arville Care, MD [facility] 06/22/2017, 3:34 PM

## 2017-06-28 ENCOUNTER — Telehealth: Payer: Self-pay | Admitting: Family Medicine

## 2017-06-28 NOTE — Telephone Encounter (Signed)
Pt aware appt made

## 2017-06-28 NOTE — Telephone Encounter (Signed)
If patient is still having a lot of issues then she may need to be seen again and we can see what we can help with.

## 2017-06-29 ENCOUNTER — Encounter: Payer: Self-pay | Admitting: Family Medicine

## 2017-06-29 ENCOUNTER — Ambulatory Visit: Payer: Medicaid Other | Admitting: Family Medicine

## 2017-06-29 VITALS — BP 130/89 | HR 88 | Temp 98.2°F | Ht 67.5 in | Wt 207.0 lb

## 2017-06-29 DIAGNOSIS — R51 Headache: Secondary | ICD-10-CM | POA: Diagnosis not present

## 2017-06-29 DIAGNOSIS — H538 Other visual disturbances: Secondary | ICD-10-CM | POA: Diagnosis not present

## 2017-06-29 DIAGNOSIS — R519 Headache, unspecified: Secondary | ICD-10-CM

## 2017-06-29 DIAGNOSIS — G43009 Migraine without aura, not intractable, without status migrainosus: Secondary | ICD-10-CM

## 2017-06-29 DIAGNOSIS — G4459 Other complicated headache syndrome: Secondary | ICD-10-CM

## 2017-06-29 DIAGNOSIS — J410 Simple chronic bronchitis: Secondary | ICD-10-CM | POA: Diagnosis not present

## 2017-06-29 MED ORDER — HYDROCODONE-HOMATROPINE 5-1.5 MG/5ML PO SYRP: 5.0000 mL | ORAL_SOLUTION | Freq: Four times a day (QID) | ORAL | 0 refills | Status: DC | PRN

## 2017-06-29 MED ORDER — AZITHROMYCIN 250 MG PO TABS: ORAL_TABLET | ORAL | 0 refills | Status: DC

## 2017-06-29 MED ORDER — PROPRANOLOL HCL ER 120 MG PO CP24
120.0000 mg | ORAL_CAPSULE | Freq: Every day | ORAL | 3 refills | Status: DC
Start: 2017-06-29 — End: 2018-01-30

## 2017-06-29 NOTE — Progress Notes (Signed)
BP 130/89   Pulse 88   Temp 98.2 F (36.8 C) (Oral)   Ht 5' 7.5" (1.715 m)   Wt 207 lb (93.9 kg)   BMI 31.94 kg/m    Subjective:    Patient ID: Pam Chen, female    DOB: Mar 12, 1971, 47 y.o.   MRN: [id number]  HPI: Tameah Mihalko is a 47 y.o. female presenting on 06/29/2017 for Migraines (cannot see neurologist until 08/07/17, having more frequently, would like to have CT scan to have that out of the way beforehand; insurance will only pay for 9 Maxalt per month, would like to have Toradol on hand if needed) and Cough   HPI Cough & Congestion: Patient presents to clinic with productive cough and congestion x 2-3 weeks. She was last seen in clinic on 06/22/17 for bronchitis and was treated with Prednisone 20mg  tablets x 5 days which improved her symptoms, but got worse after treatment completion. She has also been taking Mucinex which has loosened up her phlegm making it easier to cough up. Patient has a 20+ years smoking history where she smoked anywhere from 0.5-1.5 packs a day. She quit a few times in between that time period and has recently quit on 05/29/17. She still admits to vaping frequently. Patient is also experiencing nausea, vomiting, diarrhea, sweating, SOB, and wheezing. She denies fever and chills.   Migraine: Patient presents to the clinic with chronic migraines that have become more persistent and frequent. Over the past few weeks patient has been complaining of a HA (not her typical migraines) every day which is typically relieved by Excedrin. Patient is also experiencing a migraine at least every week which is relieved by Maxalt, but she is having problems getting more tablets covered by insurance. Patient is currently taking Topamax 100 mg po daily for preventative measures, but has not seemed to help. Patient states that the character of her migraines have not changed, but they seem to "hit harder" now and more frequent. Patient has not noticed any triggers because she has been  cautious to avoid them. She does admit to having normal stress at work. Patient describes the headaches in the frontal region above both eyes, but sometimes starts directly behind just one eye. Patient admits to blurry vision and nausea with her migraines. Patient denies balance issues or other neurological changes associated with her migraines.   Relevant past medical, surgical, family and social history reviewed and updated as indicated. Interim medical history since our last visit reviewed. Allergies and medications reviewed and updated.  Review of Systems  Constitutional: Positive for diaphoresis (patient states she is normally hot natured). Negative for chills, fatigue and fever.  HENT: Positive for congestion. Negative for ear discharge, ear pain, postnasal drip, rhinorrhea, sinus pressure, sinus pain, sore throat and trouble swallowing.   Eyes: Positive for visual disturbance (during migraines). Negative for photophobia, pain, discharge and itching.  Respiratory: Positive for cough (productive constant cough), shortness of breath (particularly at night) and wheezing. Negative for chest tightness.   Cardiovascular: Negative for chest pain.  Gastrointestinal: Positive for diarrhea, nausea and vomiting. Negative for abdominal pain and blood in stool.  Neurological: Positive for headaches (regular HAs everyday; migraines weekly). Negative for dizziness, speech difficulty, weakness and light-headedness.    Per HPI unless specifically indicated above   Allergies as of 06/29/2017      Reactions   Aspirin Other (See Comments)   Upset tummy.  Can take ibuprofen.   Oxycodone-acetaminophen Anxiety   Can take vicodin  Tramadol Hcl Nausea Only      Medication List        Accurate as of 06/29/17  9:54 AM. Always use your most recent med list.          azithromycin 250 MG tablet Commonly known as:  ZITHROMAX Take 2 the first day and then one each day after.   buPROPion 300 MG 24 hr  tablet Commonly known as:  WELLBUTRIN XL Take 300 mg by mouth daily.   cyclobenzaprine 10 MG tablet Commonly known as:  FLEXERIL Take 1 tablet (10 mg total) by mouth 2 (two) times daily.   diazepam 5 MG tablet Commonly known as:  VALIUM Take 5 mg by mouth 3 (three) times daily.   HM VITAMIN D3 4000 units Caps Generic drug:  Cholecalciferol Take 4,000 Units by mouth daily.   HYDROcodone-acetaminophen 5-325 MG tablet Commonly known as:  NORCO/VICODIN Take 1 tablet by mouth daily as needed for moderate pain. Do not refill until 60 days from prescription date   HYDROcodone-acetaminophen 5-325 MG tablet Commonly known as:  NORCO/VICODIN Take 1 tablet by mouth daily as needed for moderate pain. Do not refill until 30 days from prescription date   HYDROcodone-acetaminophen 5-325 MG tablet Commonly known as:  NORCO/VICODIN Take 1 tablet by mouth daily as needed for moderate pain.   HYDROcodone-homatropine 5-1.5 MG/5ML syrup Commonly known as:  HYCODAN Take 5 mLs by mouth every 6 (six) hours as needed for cough.   levothyroxine 75 MCG tablet Commonly known as:  SYNTHROID, LEVOTHROID Take 1 tablet (75 mcg total) by mouth daily.   multivitamin with minerals Tabs tablet Take 1 tablet by mouth daily.   omeprazole 20 MG capsule Commonly known as:  PRILOSEC TAKE 1 CAPSULE (20 MG TOTAL) BY MOUTH 2 (TWO) TIMES DAILY BEFORE A MEAL.   promethazine 12.5 MG tablet Commonly known as:  PHENERGAN Take 1 tablet (12.5 mg total) by mouth every 6 (six) hours as needed for nausea or vomiting.   propranolol ER 120 MG 24 hr capsule Commonly known as:  INDERAL LA Take 1 capsule (120 mg total) by mouth daily.   rizatriptan 10 MG tablet Commonly known as:  MAXALT TAKE 1 TABLET (10 MG TOTAL) BY MOUTH AS NEEDED FOR MIGRAINE. MAY REPEAT IN 2 HOURS IF NEEDED   topiramate 50 MG tablet Commonly known as:  TOPAMAX TAKE 2 TABLETS BY MOUTH EVERY DAY          Objective:    BP 130/89   Pulse 88    Temp 98.2 F (36.8 C) (Oral)   Ht 5' 7.5" (1.715 m)   Wt 207 lb (93.9 kg)   BMI 31.94 kg/m   Wt Readings from Last 3 Encounters:  06/29/17 207 lb (93.9 kg)  06/22/17 213 lb (96.6 kg)  06/12/17 211 lb (95.7 kg)    Physical Exam  Constitutional: She is oriented to person, place, and time. She appears well-developed and well-nourished. No distress.  HENT:  Head: Normocephalic.  Right Ear: External ear normal.  Left Ear: External ear normal.  Nose: Nose normal.  Mouth/Throat: Oropharynx is clear and moist. No oropharyngeal exudate.  Eyes: Conjunctivae and EOM are normal. Pupils are equal, round, and reactive to light. Right eye exhibits no discharge. Left eye exhibits no discharge.  Neck: No tracheal deviation present. No thyromegaly present.  Cardiovascular: Normal rate, regular rhythm and normal heart sounds. Exam reveals no gallop and no friction rub.  No murmur heard. Pulmonary/Chest: Effort normal and breath sounds  normal. No stridor. No respiratory distress. She has no wheezes. She has no rales.  Lymphadenopathy:    She has no cervical adenopathy.  Neurological: She is alert and oriented to person, place, and time. No cranial nerve deficit. Coordination normal.  Skin: She is not diaphoretic.  Psychiatric: She has a normal mood and affect. Her behavior is normal. Judgment and thought content normal.        Assessment & Plan:   Problem List Items Addressed This Visit      Cardiovascular and Mediastinum   Migraine   Relevant Medications   propranolol ER (INDERAL LA) 120 MG 24 hr capsule   Other Relevant Orders   MR Brain Wo Contrast    Other Visit Diagnoses    Persistent headaches    -  Primary   Relevant Medications   propranolol ER (INDERAL LA) 120 MG 24 hr capsule   Other Relevant Orders   MR Brain Wo Contrast   Blurred vision, bilateral       Relevant Orders   MR Brain Wo Contrast   Other complicated headache syndrome       Relevant Medications    propranolol ER (INDERAL LA) 120 MG 24 hr capsule   Other Relevant Orders   MR Brain Wo Contrast   Smokers' cough (HCC)       Patient has a persistent cough after quitting smoking, gave samples for Brio   Relevant Medications   azithromycin (ZITHROMAX) 250 MG tablet   HYDROcodone-homatropine (HYCODAN) 5-1.5 MG/5ML syrup    Smoker's Cough: Patient presents to the clinic with a productive cough x 2-3 weeks which began shortly after quitting smoking on 05/29/17. Patient was prescribed Prednisone x 5 days on 06/22/17 for bronchitis which alleviated her symptoms, but flared right back up after treatment ended. Physical exam was insignificant with no abnormal findings. Patient was given 2 BREO samples to help with breathing and was instructed to take 1 puff daily. Patient was also prescribed Azithromycin 250mg  tablets x 5 days and Hycodan 5-1.5MG /5ML syrup to help with productive cough. Patient has been instructed to call the clinic if her symptoms do not improve after treatment or if they get worse.  Migraine:  Patient presents to clinic with chronic migraines that have increased in frequency. She is prescribed Topamax 100mg  tablets po daily as preventative and Maxalt 10mg  as needed for migraines. Patient states that the Topamax is not working because she is still having about 1 migraine a week with regular headaches daily. Patient is scheduled to see a Neurologist for her migraines, but can't get in until 08/07/17. Insurance will only cover 9 Maxalt tablets monthly which hasn't been enough for her frequent migraines. Patient has been prescribed Propranolol 120 mg tablets to take in place of the Topamax for preventative measures. Patient has been instructed to take medication at night due to drowsiness. Patient is also being referred for a MRI without contrast, so she will have it when she goes to Neurology. Patient has been instructed to return in 3 months to recheck migraine medication, or return earlier if  symptoms do not improve or get worse.  Follow up plan: Return in about 3 months (around 09/26/2017), or if symptoms worsen or fail to improve, for Recheck cough and headaches.  Counseling provided for all of the vaccine components Orders Placed This Encounter  Procedures  . MR Brain Wo Contrast   Patient was seen and examined with Rayfield Citizenaroline cheeks PA student, agree with assessment and plan above.  Patient's cough is likely because she quit smoking and is dealing with a residual but because of the persistence of symptoms we will try azithromycin. Arville Care, MD [facility] 07/02/2017, 8:09 AM

## 2017-07-04 ENCOUNTER — Ambulatory Visit (HOSPITAL_COMMUNITY)
Admission: RE | Admit: 2017-07-04 | Discharge: 2017-07-04 | Disposition: A | Discharge status: Home / Self Care | Payer: Medicaid Other | Source: Ambulatory Visit | Attending: Family Medicine | Admitting: Family Medicine

## 2017-07-04 DIAGNOSIS — J320 Chronic maxillary sinusitis: Secondary | ICD-10-CM | POA: Insufficient documentation

## 2017-07-04 DIAGNOSIS — G4459 Other complicated headache syndrome: Secondary | ICD-10-CM | POA: Insufficient documentation

## 2017-07-04 DIAGNOSIS — G43009 Migraine without aura, not intractable, without status migrainosus: Secondary | ICD-10-CM | POA: Diagnosis present

## 2017-07-04 DIAGNOSIS — R51 Headache: Secondary | ICD-10-CM

## 2017-07-04 DIAGNOSIS — R519 Headache, unspecified: Secondary | ICD-10-CM

## 2017-07-04 DIAGNOSIS — H538 Other visual disturbances: Secondary | ICD-10-CM

## 2017-07-05 ENCOUNTER — Other Ambulatory Visit: Payer: Self-pay | Admitting: *Deleted

## 2017-07-05 DIAGNOSIS — J349 Unspecified disorder of nose and nasal sinuses: Secondary | ICD-10-CM

## 2017-07-13 ENCOUNTER — Other Ambulatory Visit: Payer: Self-pay | Admitting: Family Medicine

## 2017-07-13 ENCOUNTER — Telehealth: Payer: Self-pay | Admitting: Family Medicine

## 2017-07-24 ENCOUNTER — Ambulatory Visit: Payer: Medicaid Other | Admitting: Family Medicine

## 2017-07-24 ENCOUNTER — Encounter: Payer: Self-pay | Admitting: Family Medicine

## 2017-07-24 VITALS — BP 125/89 | HR 75 | Temp 98.0°F | Ht 67.5 in | Wt 208.4 lb

## 2017-07-24 DIAGNOSIS — G43809 Other migraine, not intractable, without status migrainosus: Secondary | ICD-10-CM

## 2017-07-24 MED ORDER — RIZATRIPTAN BENZOATE 10 MG PO TABS: 10.0000 mg | ORAL_TABLET | ORAL | 2 refills | Status: DC | PRN

## 2017-07-24 MED ORDER — KETOROLAC TROMETHAMINE 60 MG/2ML IM SOLN
60.0000 mg | Freq: Once | INTRAMUSCULAR | Status: AC
  Administered 2017-07-24: 60 mg via INTRAMUSCULAR

## 2017-07-24 MED ORDER — BETAMETHASONE SOD PHOS & ACET 6 (3-3) MG/ML IJ SUSP
6.0000 mg | Freq: Once | INTRAMUSCULAR | Status: AC
  Administered 2017-07-24: 6 mg via INTRAMUSCULAR

## 2017-07-24 MED ORDER — LEVOFLOXACIN 500 MG PO TABS: 500.0000 mg | ORAL_TABLET | Freq: Every day | ORAL | 0 refills | Status: DC

## 2017-07-24 NOTE — Progress Notes (Signed)
Subjective:  Patient ID: Pam Chen, female    DOB: 1970-09-21  Age: 47 y.o. MRN: [id number]  CC: Migraine   HPI Pam Chen presents for migraine pain. Onset was 2/22 at 3 AM. Continues in spite of using 8 Maxalt over the last 4 days. Pain has been intermittent. Off and on for 4 days. Described as a pounding sensation over the left temple currently. Rates pain as 7/10. No focal changes such as visual changes.  Depression screen [facility] 2/9 07/27/2017 07/24/2017 06/29/2017  Decreased Interest 0 0 0  Down, Depressed, Hopeless 0 0 1  PHQ - 2 Score 0 0 1  Altered sleeping 0 - -  Tired, decreased energy - - -  Change in appetite - - -  Feeling bad or failure about yourself  - - -  Trouble concentrating - - -  Moving slowly or fidgety/restless - - -  Suicidal thoughts - - -  PHQ-9 Score 0 - -  Difficult doing work/chores - - -    History Pam Chen has a past medical history of Arthritis, Diverticulitis, GERD (gastroesophageal reflux disease), Kidney stones, Migraine, Rotator cuff injury, Sleep apnea, Tennis elbow, and Thyroid disease.   Pam Chen has a past surgical history that includes Abdominal hysterectomy; Lithotripsy; Colon surgery; and Sleeve Gastroplasty.   Pam Chen family history includes Alzheimer's disease in Pam Chen maternal grandmother; Cancer in Pam Chen maternal grandmother and paternal grandmother; Diabetes in Pam Chen maternal grandfather and paternal grandfather; Heart disease in Pam Chen paternal grandfather; Hyperlipidemia in Pam Chen brother; Hypertension in Pam Chen father and paternal grandfather; Sleep apnea in Pam Chen father.Pam Chen reports that Pam Chen quit smoking about 2 months ago. Pam Chen smoking use included cigarettes. Pam Chen has never used smokeless tobacco. Pam Chen reports that Pam Chen does not drink alcohol or use drugs.    ROS Review of Systems  Constitutional: Negative for activity change, appetite change and fever.  HENT: Negative for congestion, rhinorrhea and sore throat.   Eyes: Negative for visual disturbance.    Respiratory: Negative for cough and shortness of breath.   Cardiovascular: Negative for chest pain and palpitations.  Gastrointestinal: Negative for abdominal pain, diarrhea and nausea.  Genitourinary: Negative for dysuria.  Musculoskeletal: Negative for arthralgias and myalgias.  Neurological: Positive for light-headedness and headaches. Negative for seizures, speech difficulty and numbness.    Objective:  BP 125/89   Pulse 75   Temp 98 F (36.7 C) (Oral)   Ht 5' 7.5" (1.715 m)   Wt 208 lb 6 oz (94.5 kg)   BMI 32.15 kg/m   BP Readings from Last 3 Encounters:  07/27/17 122/82  07/24/17 125/89  06/29/17 130/89    Wt Readings from Last 3 Encounters:  07/27/17 208 lb (94.3 kg)  07/24/17 208 lb 6 oz (94.5 kg)  06/29/17 207 lb (93.9 kg)     Physical Exam  Constitutional: Pam Chen is oriented to person, place, and time. Pam Chen appears well-developed and well-nourished. No distress.  HENT:  Head: Normocephalic and atraumatic.  Right Ear: External ear normal.  Left Ear: External ear normal.  Nose: Nose normal.  Mouth/Throat: Oropharynx is clear and moist.  Eyes: Conjunctivae and EOM are normal. Pupils are equal, round, and reactive to light.  Neck: Normal range of motion. Neck supple. No thyromegaly present.  Cardiovascular: Normal rate, regular rhythm and normal heart sounds.  No murmur heard. Pulmonary/Chest: Effort normal and breath sounds normal. No respiratory distress. Pam Chen has no wheezes. Pam Chen has no rales.  Abdominal: Soft. Bowel sounds are normal. Pam Chen exhibits no distension. There is  no tenderness.  Lymphadenopathy:    Pam Chen has no cervical adenopathy.  Neurological: Pam Chen is alert and oriented to person, place, and time. Pam Chen has normal reflexes.  Skin: Skin is warm and dry.  Psychiatric: Pam Chen has a normal mood and affect. Pam Chen behavior is normal. Judgment and thought content normal.      Assessment & Plan:   Pam Chen was seen today for migraine.  Diagnoses and all orders  for this visit:  Other migraine without status migrainosus, not intractable -     betamethasone acetate-betamethasone sodium phosphate (CELESTONE) injection 6 mg -     ketorolac (TORADOL) injection 60 mg  Other orders -     levofloxacin (LEVAQUIN) 500 MG tablet; Take 1 tablet (500 mg total) by mouth daily. -     rizatriptan (MAXALT) 10 MG tablet; Take 1 tablet (10 mg total) by mouth as needed for migraine. May repeat in 2 hours if needed       I have discontinued [name]'s topiramate, azithromycin, and HYDROcodone-homatropine. I am also having Pam Chen start on levofloxacin. Additionally, I am having Pam Chen maintain Pam Chen buPROPion, Cholecalciferol, multivitamin with minerals, cyclobenzaprine, levothyroxine, omeprazole, diazepam, HYDROcodone-acetaminophen, HYDROcodone-acetaminophen, HYDROcodone-acetaminophen, propranolol ER, promethazine, and rizatriptan. We administered betamethasone acetate-betamethasone sodium phosphate and ketorolac.  Allergies as of 07/24/2017      Reactions   Aspirin Other (See Comments)   Upset tummy.  Can take ibuprofen.   Oxycodone-acetaminophen Anxiety   Can take vicodin   Tramadol Hcl Nausea Only      Medication List        Accurate as of 07/24/17 11:59 PM. Always use your most recent med list.          buPROPion 300 MG 24 hr tablet Commonly known as:  WELLBUTRIN XL Take 300 mg by mouth daily.   cyclobenzaprine 10 MG tablet Commonly known as:  FLEXERIL Take 1 tablet (10 mg total) by mouth 2 (two) times daily.   diazepam 5 MG tablet Commonly known as:  VALIUM Take 5 mg by mouth 3 (three) times daily.   HM VITAMIN D3 4000 units Caps Generic drug:  Cholecalciferol Take 4,000 Units by mouth daily.   HYDROcodone-acetaminophen 5-325 MG tablet Commonly known as:  NORCO/VICODIN Take 1 tablet by mouth daily as needed for moderate pain. Do not refill until 60 days from prescription date   HYDROcodone-acetaminophen 5-325 MG tablet Commonly known as:   NORCO/VICODIN Take 1 tablet by mouth daily as needed for moderate pain. Do not refill until 30 days from prescription date   HYDROcodone-acetaminophen 5-325 MG tablet Commonly known as:  NORCO/VICODIN Take 1 tablet by mouth daily as needed for moderate pain.   levofloxacin 500 MG tablet Commonly known as:  LEVAQUIN Take 1 tablet (500 mg total) by mouth daily.   levothyroxine 75 MCG tablet Commonly known as:  SYNTHROID, LEVOTHROID Take 1 tablet (75 mcg total) by mouth daily.   multivitamin with minerals Tabs tablet Take 1 tablet by mouth daily.   omeprazole 20 MG capsule Commonly known as:  PRILOSEC TAKE 1 CAPSULE (20 MG TOTAL) BY MOUTH 2 (TWO) TIMES DAILY BEFORE A MEAL.   promethazine 12.5 MG tablet Commonly known as:  PHENERGAN TAKE 1 TABLET (12.5 MG TOTAL) BY MOUTH EVERY 6 (SIX) HOURS AS NEEDED FOR NAUSEA OR VOMITING.   propranolol ER 120 MG 24 hr capsule Commonly known as:  INDERAL LA Take 1 capsule (120 mg total) by mouth daily.   rizatriptan 10 MG tablet Commonly known as:  MAXALT Take  1 tablet (10 mg total) by mouth as needed for migraine. May repeat in 2 hours if needed        Follow-up: Return in about 2 weeks (around 08/07/2017), or if symptoms worsen or fail to improve.  Mechele ClaudeWarren Myking Sar, M.D.

## 2017-07-27 ENCOUNTER — Encounter: Payer: Self-pay | Admitting: Family Medicine

## 2017-07-27 ENCOUNTER — Ambulatory Visit: Payer: Medicaid Other | Admitting: Family Medicine

## 2017-07-27 VITALS — BP 122/82 | HR 73 | Temp 98.2°F | Ht 67.5 in | Wt 208.0 lb

## 2017-07-27 DIAGNOSIS — G43009 Migraine without aura, not intractable, without status migrainosus: Secondary | ICD-10-CM | POA: Diagnosis not present

## 2017-07-27 MED ORDER — KETOROLAC TROMETHAMINE 60 MG/2ML IM SOLN
60.0000 mg | Freq: Once | INTRAMUSCULAR | Status: AC
  Administered 2017-07-27: 60 mg via INTRAMUSCULAR

## 2017-07-27 MED ORDER — ONDANSETRON 4 MG PO TBDP: 4.0000 mg | ORAL_TABLET | Freq: Three times a day (TID) | ORAL | 0 refills | Status: DC | PRN

## 2017-07-27 MED ORDER — METHYLPREDNISOLONE ACETATE 80 MG/ML IJ SUSP
80.0000 mg | Freq: Once | INTRAMUSCULAR | Status: AC
  Administered 2017-07-27: 80 mg via INTRAMUSCULAR

## 2017-07-27 NOTE — Progress Notes (Signed)
BP 122/82   Pulse 73   Temp 98.2 F (36.8 C) (Oral)   Ht 5' 7.5" (1.715 m)   Wt 208 lb (94.3 kg)   BMI 32.10 kg/m    Subjective:    Patient ID: Pam Chen, female    DOB: 1971/01/25, 47 y.o.   MRN: [id number]  HPI: Pam Chen is a 47 y.o. female presenting on 07/27/2017 for Headache (x 1 week; neurology appointment on 08/07/17)   HPI Headache Patient has had persistent and recurrent migraines that we have tried different treatments and prevention treatments for.  She says her migraines are about like they usually are which is frontal going more on the right side than the left with photophobia and phonophobia but she denies any aura with this migraine.  She says it started about a week ago and then it was treated here in the office and went away but then came back over the past couple days.  She denies any focal numbness or weakness.  Relevant past medical, surgical, family and social history reviewed and updated as indicated. Interim medical history since our last visit reviewed. Allergies and medications reviewed and updated.  Review of Systems  Constitutional: Negative for chills and fever.  Eyes: Negative for visual disturbance.  Respiratory: Negative for chest tightness and shortness of breath.   Cardiovascular: Negative for chest pain and leg swelling.  Musculoskeletal: Negative for back pain and gait problem.  Skin: Negative for rash.  Neurological: Positive for headaches. Negative for light-headedness.  Psychiatric/Behavioral: Negative for agitation and behavioral problems.  All other systems reviewed and are negative.   Per HPI unless specifically indicated above   Allergies as of 07/27/2017      Reactions   Aspirin Other (See Comments)   Upset tummy.  Can take ibuprofen.   Oxycodone-acetaminophen Anxiety   Can take vicodin   Tramadol Hcl Nausea Only      Medication List        Accurate as of 07/27/17  2:08 PM. Always use your most recent med list.          buPROPion 300 MG 24 hr tablet Commonly known as:  WELLBUTRIN XL Take 300 mg by mouth daily.   cyclobenzaprine 10 MG tablet Commonly known as:  FLEXERIL Take 1 tablet (10 mg total) by mouth 2 (two) times daily.   diazepam 5 MG tablet Commonly known as:  VALIUM Take 5 mg by mouth 3 (three) times daily.   HM VITAMIN D3 4000 units Caps Generic drug:  Cholecalciferol Take 4,000 Units by mouth daily.   HYDROcodone-acetaminophen 5-325 MG tablet Commonly known as:  NORCO/VICODIN Take 1 tablet by mouth daily as needed for moderate pain. Do not refill until 60 days from prescription date   HYDROcodone-acetaminophen 5-325 MG tablet Commonly known as:  NORCO/VICODIN Take 1 tablet by mouth daily as needed for moderate pain. Do not refill until 30 days from prescription date   HYDROcodone-acetaminophen 5-325 MG tablet Commonly known as:  NORCO/VICODIN Take 1 tablet by mouth daily as needed for moderate pain.   levofloxacin 500 MG tablet Commonly known as:  LEVAQUIN Take 1 tablet (500 mg total) by mouth daily.   levothyroxine 75 MCG tablet Commonly known as:  SYNTHROID, LEVOTHROID Take 1 tablet (75 mcg total) by mouth daily.   multivitamin with minerals Tabs tablet Take 1 tablet by mouth daily.   omeprazole 20 MG capsule Commonly known as:  PRILOSEC TAKE 1 CAPSULE (20 MG TOTAL) BY MOUTH 2 (TWO) TIMES  DAILY BEFORE A MEAL.   ondansetron 4 MG disintegrating tablet Commonly known as:  ZOFRAN ODT Take 1 tablet (4 mg total) by mouth every 8 (eight) hours as needed for nausea or vomiting.   promethazine 12.5 MG tablet Commonly known as:  PHENERGAN TAKE 1 TABLET (12.5 MG TOTAL) BY MOUTH EVERY 6 (SIX) HOURS AS NEEDED FOR NAUSEA OR VOMITING.   propranolol ER 120 MG 24 hr capsule Commonly known as:  INDERAL LA Take 1 capsule (120 mg total) by mouth daily.   rizatriptan 10 MG tablet Commonly known as:  MAXALT Take 1 tablet (10 mg total) by mouth as needed for migraine. May repeat in 2  hours if needed          Objective:    BP 122/82   Pulse 73   Temp 98.2 F (36.8 C) (Oral)   Ht 5' 7.5" (1.715 m)   Wt 208 lb (94.3 kg)   BMI 32.10 kg/m   Wt Readings from Last 3 Encounters:  07/27/17 208 lb (94.3 kg)  07/24/17 208 lb 6 oz (94.5 kg)  06/29/17 207 lb (93.9 kg)    Physical Exam  Constitutional: She is oriented to person, place, and time. She appears well-developed and well-nourished. No distress.  Eyes: Conjunctivae are normal.  Neck: Neck supple. No thyromegaly present.  Cardiovascular: Normal rate, regular rhythm, normal heart sounds and intact distal pulses.  No murmur heard. Pulmonary/Chest: Effort normal and breath sounds normal. No respiratory distress. She has no wheezes.  Musculoskeletal: Normal range of motion.  Lymphadenopathy:    She has no cervical adenopathy.  Neurological: She is alert and oriented to person, place, and time. No cranial nerve deficit. She exhibits normal muscle tone. Coordination normal.  Skin: Skin is warm and dry. No rash noted. She is not diaphoretic.  Psychiatric: She has a normal mood and affect. Her behavior is normal.  Nursing note and vitals reviewed.       Assessment & Plan:   Problem List Items Addressed This Visit      Cardiovascular and Mediastinum   Migraine - Primary   Relevant Medications   ondansetron (ZOFRAN ODT) 4 MG disintegrating tablet   ketorolac (TORADOL) injection 60 mg (Completed)   methylPREDNISolone acetate (DEPO-MEDROL) injection 80 mg (Completed)       Follow up plan: Return if symptoms worsen or fail to improve.  Counseling provided for all of the vaccine components No orders of the defined types were placed in this encounter.   Arville CareJoshua [name], MD [facility] 07/27/2017, 2:08 PM

## 2017-08-02 ENCOUNTER — Encounter: Payer: Self-pay | Admitting: Family Medicine

## 2017-08-03 ENCOUNTER — Emergency Department (HOSPITAL_COMMUNITY)
Admission: EM | Admit: 2017-08-03 | Discharge: 2017-08-03 | Disposition: A | Discharge status: Home / Self Care | Payer: Medicaid Other | Attending: Emergency Medicine | Admitting: Emergency Medicine

## 2017-08-03 ENCOUNTER — Encounter (HOSPITAL_COMMUNITY): Payer: Self-pay | Admitting: Cardiology

## 2017-08-03 DIAGNOSIS — Z79899 Other long term (current) drug therapy: Secondary | ICD-10-CM | POA: Insufficient documentation

## 2017-08-03 DIAGNOSIS — F17210 Nicotine dependence, cigarettes, uncomplicated: Secondary | ICD-10-CM | POA: Insufficient documentation

## 2017-08-03 DIAGNOSIS — R51 Headache: Secondary | ICD-10-CM | POA: Insufficient documentation

## 2017-08-03 DIAGNOSIS — E039 Hypothyroidism, unspecified: Secondary | ICD-10-CM | POA: Insufficient documentation

## 2017-08-03 DIAGNOSIS — R519 Headache, unspecified: Secondary | ICD-10-CM

## 2017-08-03 MED ORDER — DEXAMETHASONE SODIUM PHOSPHATE 4 MG/ML IJ SOLN
10.0000 mg | Freq: Once | INTRAMUSCULAR | Status: AC
  Administered 2017-08-03: 10 mg via INTRAVENOUS
  Filled 2017-08-03: qty 3

## 2017-08-03 MED ORDER — KETOROLAC TROMETHAMINE 30 MG/ML IJ SOLN
15.0000 mg | Freq: Once | INTRAMUSCULAR | Status: AC
  Administered 2017-08-03: 15 mg via INTRAVENOUS
  Filled 2017-08-03: qty 1

## 2017-08-03 MED ORDER — SODIUM CHLORIDE 0.9 % IV BOLUS (SEPSIS)
1000.0000 mL | Freq: Once | INTRAVENOUS | Status: AC
  Administered 2017-08-03: 1000 mL via INTRAVENOUS

## 2017-08-03 MED ORDER — MAGNESIUM SULFATE 2 GM/50ML IV SOLN
2.0000 g | Freq: Once | INTRAVENOUS | Status: AC
  Administered 2017-08-03: 2 g via INTRAVENOUS
  Filled 2017-08-03: qty 50

## 2017-08-03 MED ORDER — HALOPERIDOL LACTATE 5 MG/ML IJ SOLN
2.5000 mg | Freq: Once | INTRAMUSCULAR | Status: AC
  Administered 2017-08-03: 2.5 mg via INTRAVENOUS
  Filled 2017-08-03: qty 1

## 2017-08-03 NOTE — ED Triage Notes (Signed)
Migraine times 2 weeks.  Has seen PCP for same.  Has appointment with neurologist.

## 2017-08-07 ENCOUNTER — Encounter: Payer: Self-pay | Admitting: Neurology

## 2017-08-07 ENCOUNTER — Ambulatory Visit: Payer: Medicaid Other | Admitting: Neurology

## 2017-08-07 DIAGNOSIS — G43009 Migraine without aura, not intractable, without status migrainosus: Secondary | ICD-10-CM

## 2017-08-07 MED ORDER — TOPIRAMATE 100 MG PO TABS: 100.0000 mg | ORAL_TABLET | Freq: Two times a day (BID) | ORAL | 11 refills | Status: DC

## 2017-08-07 MED ORDER — ONDANSETRON 4 MG PO TBDP: 4.0000 mg | ORAL_TABLET | Freq: Three times a day (TID) | ORAL | 11 refills | Status: DC | PRN

## 2017-08-07 NOTE — Progress Notes (Signed)
PATIENT: Pam Chen DOB: 03/14/1971  Chief Complaint  Patient presents with  . Migraine    Reports an increase in her migraines over the last two months.  She was previously on Topamax 50mg , one tab BID but was changed to Inderal LA 120mg , one capsule daily one month ago.  Says her migraines improved for a short time.  Maxalt works well but her monthly supply has not lasted the with the increase.  She is under added family stress right now in addition to trying to stop smoking.  She had a recent MRI brain showing sinus issues and has been referred to ENT (not seen yet).  . Sleep Apnea    She has not been using her CPAP machince recently due to an ill-fitted face mask.  Marland Kitchen. PCP    [name], Elige RadonJoshua A, MD     HISTORICAL  Pam Chen 47 year old female, seen in refer by her primary care doctor [name], Ivin BootyJoshua for evaluation of migraine headache, sleep apnea, initial evaluation was on August 07, 2017.  Reviewed and summarized a referral note, she has history of hypothyroidism, on supplement,  She reported a history of migraine headaches since teenager, gradually getting worse, her typical migraines are left lateralized severe pounding headache with associated light noise sensitivity, nauseous, lasting for a few hours, responding to Maxalt, but she also has rebound headache few hours later.  For headache, gradually getting worse over the years, have 2-3 typical migraine headaches in a week, mild to moderate ones in between, 1 month, she would use up all 9 tablets of Maxalt, and frequent over-the-counter Excedrin Migraine, double strength Tylenol use 4-5 times each week,  She also has to use Maxalt twice in the day for rebound headaches few hours later,  In March 2019, she had to severe prolonged migraine headache, lasting for 2 weeks, has to go to emergency room on March 1, and March 8, eventually improved with emergency room IV treatment, Decadron 10 mg, Haldol 2.5 mg, Toradol 30 mg,  magnesium 2 g once,  She was able to tolerate Topamax 50 mg 2 tablets every night previously for migraine prevention, which helped her some, was recently switched to Inderal LA 120 mg every night, which also provides some help.  She also has history of obstructive sleep apnea, using CPAP machine, she had 130 pound weight loss over the past 1 year,  REVIEW OF SYSTEMS: Full 14 system review of systems performed and notable only for fatigue, blurred vision, snoring, increased thirst, runny nose, memory loss, headaches, dizziness, insomnia, depression, anxiety, not enough sleep, decreased energy, racing thoughts.  ALLERGIES: Allergies  Allergen Reactions  . Aspirin Other (See Comments)    Upset tummy.  Can take ibuprofen.  . Oxycodone-Acetaminophen Anxiety    Can take vicodin  . Tramadol Hcl Nausea Only    HOME MEDICATIONS: Current Outpatient Medications  Medication Sig Dispense Refill  . buPROPion (WELLBUTRIN XL) 300 MG 24 hr tablet Take 300 mg by mouth daily.    . Cholecalciferol (HM VITAMIN D3) 4000 units CAPS Take 4,000 Units by mouth daily.    . cyclobenzaprine (FLEXERIL) 10 MG tablet Take 1 tablet (10 mg total) by mouth 2 (two) times daily. 60 tablet 5  . diazepam (VALIUM) 5 MG tablet Take 5 mg by mouth daily as needed for anxiety.   0  . HYDROcodone-acetaminophen (NORCO/VICODIN) 5-325 MG tablet Take 1 tablet by mouth daily as needed for moderate pain. Do not refill until 60 days from prescription date  20 tablet 0  . levofloxacin (LEVAQUIN) 500 MG tablet Take 1 tablet (500 mg total) by mouth daily. 14 tablet 0  . levothyroxine (SYNTHROID, LEVOTHROID) 75 MCG tablet Take 1 tablet (75 mcg total) by mouth daily. 90 tablet 2  . Multiple Vitamin (MULTIVITAMIN WITH MINERALS) TABS tablet Take 1 tablet by mouth daily.    Marland Kitchen omeprazole (PRILOSEC) 20 MG capsule TAKE 1 CAPSULE (20 MG TOTAL) BY MOUTH 2 (TWO) TIMES DAILY BEFORE A MEAL. (Patient taking differently: Take 20 mg by mouth 2 (two) times  daily as needed. ) 180 capsule 0  . ondansetron (ZOFRAN ODT) 4 MG disintegrating tablet Take 1 tablet (4 mg total) by mouth every 8 (eight) hours as needed for nausea or vomiting. 20 tablet 0  . promethazine (PHENERGAN) 12.5 MG tablet TAKE 1 TABLET (12.5 MG TOTAL) BY MOUTH EVERY 6 (SIX) HOURS AS NEEDED FOR NAUSEA OR VOMITING. (Patient not taking: Reported on 08/03/2017) 30 tablet 2  . propranolol ER (INDERAL LA) 120 MG 24 hr capsule Take 1 capsule (120 mg total) by mouth daily. 30 capsule 3  . rizatriptan (MAXALT) 10 MG tablet Take 1 tablet (10 mg total) by mouth as needed for migraine. May repeat in 2 hours if needed 10 tablet 2   No current facility-administered medications for this visit.     PAST MEDICAL HISTORY: Past Medical History:  Diagnosis Date  . Arthritis   . Diverticulitis   . GERD (gastroesophageal reflux disease)   . Kidney stones   . Migraine   . Rotator cuff injury   . Sleep apnea    CPAP  . Tennis elbow   . Thyroid disease     PAST SURGICAL HISTORY: Past Surgical History:  Procedure Laterality Date  . ABDOMINAL HYSTERECTOMY    . COLON SURGERY     diverticulitis  . LITHOTRIPSY    . SLEEVE GASTROPLASTY      FAMILY HISTORY: Family History  Problem Relation Age of Onset  . Hypertension Father   . Sleep apnea Father   . Cancer Maternal Grandmother        breast  . Alzheimer's disease Maternal Grandmother   . Diabetes Maternal Grandfather   . Cancer Paternal Grandmother        ovarian  . Hypertension Paternal Grandfather   . Heart disease Paternal Grandfather   . Diabetes Paternal Grandfather   . Hyperlipidemia Brother     SOCIAL HISTORY:  Social History   Socioeconomic History  . Marital status: Single    Spouse name: Not on file  . Number of children: Not on file  . Years of education: Not on file  . Highest education level: Not on file  Social Needs  . Financial resource strain: Not on file  . Food insecurity - worry: Not on file  . Food  insecurity - inability: Not on file  . Transportation needs - medical: Not on file  . Transportation needs - non-medical: Not on file  Occupational History  . Not on file  Tobacco Use  . Smoking status: Former Smoker    Types: Cigarettes    Last attempt to quit: 05/29/2017    Years since quitting: 0.1  . Smokeless tobacco: Never Used  Substance and Sexual Activity  . Alcohol use: No  . Drug use: No  . Sexual activity: Not on file  Other Topics Concern  . Not on file  Social History Narrative  . Not on file     PHYSICAL EXAM  Vitals:   08/07/17 0818  BP: (!) 150/96  Pulse: 85  Weight: 207 lb 8 oz (94.1 kg)  Height: 5' 7.5" (1.715 m)    Not recorded      Body mass index is 32.02 kg/m.  PHYSICAL EXAMNIATION:  Gen: NAD, conversant, well nourised, obese, well groomed                     Cardiovascular: Regular rate rhythm, no peripheral edema, warm, nontender. Eyes: Conjunctivae clear without exudates or hemorrhage Neck: Supple, no carotid bruits. Pulmonary: Clear to auscultation bilaterally   NEUROLOGICAL EXAM:  MENTAL STATUS: Speech:    Speech is normal; fluent and spontaneous with normal comprehension.  Cognition:     Orientation to time, place and person     Normal recent and remote memory     Normal Attention span and concentration     Normal Language, naming, repeating,spontaneous speech     Fund of knowledge   CRANIAL NERVES: CN II: Visual fields are full to confrontation. Fundoscopic exam is normal with sharp discs and no vascular changes. Pupils are round equal and briskly reactive to light. CN III, IV, VI: extraocular movement are normal. No ptosis. CN V: Facial sensation is intact to pinprick in all 3 divisions bilaterally. Corneal responses are intact.  CN VII: Face is symmetric with normal eye closure and smile. CN VIII: Hearing is normal to rubbing fingers CN IX, X: Palate elevates symmetrically. Phonation is normal. CN XI: Head turning and  shoulder shrug are intact CN XII: Tongue is midline with normal movements and no atrophy.  MOTOR: There is no pronator drift of out-stretched arms. Muscle bulk and tone are normal. Muscle strength is normal.  REFLEXES: Reflexes are 2+ and symmetric at the biceps, triceps, knees, and ankles. Plantar responses are flexor.  SENSORY: Intact to light touch, pinprick, positional sensation and vibratory sensation are intact in fingers and toes.  COORDINATION: Rapid alternating movements and fine finger movements are intact. There is no dysmetria on finger-to-nose and heel-knee-shin.    GAIT/STANCE: Posture is normal. Gait is steady with normal steps, base, arm swing, and turning. Heel and toe walking are normal. Tandem gait is normal.  Romberg is absent.   DIAGNOSTIC DATA (LABS, IMAGING, TESTING) - I reviewed patient records, labs, notes, testing and imaging myself where available.   ASSESSMENT AND PLAN  Destenie Figuero is a 47 y.o. female    Chronic migraine headaches  With a component of medicine rebound headache,  Continue preventive medication Topamax 100 twice a day, add on Inderal LA 120 mg every night as migraine prevention  Maxalt as needed, may add on Zofran, flexeril, Tylenol, for abortive treatment  Levert Feinstein, M.D. Ph.D.  [facility] [address] [place], [place] [address] Ph: [phone] Fax: [phone]  CC: Referring Provider

## 2017-08-07 NOTE — Patient Instructions (Signed)
You may take  Maxalt Zofran Flexeril Tylenol  Benadryl  As needed during intense migraine.  Try to avoid frequent over counter medication use

## 2017-08-09 NOTE — ED Provider Notes (Signed)
[facility] EMERGENCY DEPARTMENT Provider Note   CSN: [id number] Arrival date & time: 08/03/17  0807     History   Chief Complaint Chief Complaint  Patient presents with  . Migraine    HPI Pam Chen is a 47 y.o. female.  HPI   47 year old female with headache.  She has a past history of what she calls migraine headaches.  She states that this her headache started about 2 weeks ago.  Persistent since then.  Has waxed and waned but has not completely gone away.  She has been taking over-the-counter pain medication with only mild improvement.  Associated photophobia and some mild nausea.  No neck pain or neck stiffness.  No fevers.  Denies any trauma.  Past Medical History:  Diagnosis Date  . Arthritis   . Diverticulitis   . GERD (gastroesophageal reflux disease)   . Kidney stones   . Migraine   . Rotator cuff injury   . Sleep apnea    CPAP  . Tennis elbow   . Thyroid disease     Patient Active Problem List   Diagnosis Date Noted  . Hyperlipidemia LDL goal <130 05/01/2017  . Current smoker 03/07/2017  . Obesity (BMI 30.0-34.9) 02/02/2017  . Hypothyroid 02/02/2017  . GERD (gastroesophageal reflux disease) 02/02/2017  . Migraine 02/02/2017  . Bipolar 1 disorder, depressed (HCC) 02/02/2017    Past Surgical History:  Procedure Laterality Date  . ABDOMINAL HYSTERECTOMY    . COLON SURGERY     diverticulitis  . LITHOTRIPSY    . SLEEVE GASTROPLASTY      OB History    No data available       Home Medications    Prior to Admission medications   Medication Sig Start Date End Date Taking? Authorizing Provider  buPROPion (WELLBUTRIN XL) 300 MG 24 hr tablet Take 300 mg by mouth daily.   Yes [provider]  Cholecalciferol (HM VITAMIN D3) 4000 units CAPS Take 4,000 Units by mouth daily.   Yes [provider]  cyclobenzaprine (FLEXERIL) 10 MG tablet Take 1 tablet (10 mg total) by mouth 2 (two) times daily. 03/02/17  Yes [name], [name], MD    diazepam (VALIUM) 5 MG tablet Take 5 mg by mouth daily as needed for anxiety.  05/21/17  Yes [provider]  HYDROcodone-acetaminophen (NORCO/VICODIN) 5-325 MG tablet Take 1 tablet by mouth daily as needed for moderate pain. Do not refill until 60 days from prescription date 06/22/17  Yes [name], [name], MD  levofloxacin (LEVAQUIN) 500 MG tablet Take 1 tablet (500 mg total) by mouth daily. 07/24/17  Yes Stacks, Broadus John, MD  levothyroxine (SYNTHROID, LEVOTHROID) 75 MCG tablet Take 1 tablet (75 mcg total) by mouth daily. 03/07/17  Yes Hawks, Christy A, FNP  Multiple Vitamin (MULTIVITAMIN WITH MINERALS) TABS tablet Take 1 tablet by mouth daily.   Yes [provider]  omeprazole (PRILOSEC) 20 MG capsule TAKE 1 CAPSULE (20 MG TOTAL) BY MOUTH 2 (TWO) TIMES DAILY BEFORE A MEAL. Patient taking differently: Take 20 mg by mouth 2 (two) times daily as needed.  06/08/17  Yes Hawks, Christy A, FNP  propranolol ER (INDERAL LA) 120 MG 24 hr capsule Take 1 capsule (120 mg total) by mouth daily. 06/29/17  Yes [name], [name], MD  rizatriptan (MAXALT) 10 MG tablet Take 1 tablet (10 mg total) by mouth as needed for migraine. May repeat in 2 hours if needed 07/24/17  Yes Mechele Claude, MD  ondansetron (ZOFRAN ODT) 4  MG disintegrating tablet Take 1 tablet (4 mg total) by mouth every 8 (eight) hours as needed for nausea or vomiting. 08/07/17   Levert Feinstein, MD  promethazine (PHENERGAN) 12.5 MG tablet TAKE 1 TABLET (12.5 MG TOTAL) BY MOUTH EVERY 6 (SIX) HOURS AS NEEDED FOR NAUSEA OR VOMITING. 07/13/17   [name], [name], MD  topiramate (TOPAMAX) 100 MG tablet Take 1 tablet (100 mg total) by mouth 2 (two) times daily. 08/07/17   Levert Feinstein, MD    Family History Family History  Problem Relation Age of Onset  . Hypertension Father   . Sleep apnea Father   . Cancer Maternal Grandmother        breast  . Alzheimer's disease Maternal Grandmother   . Diabetes Maternal Grandfather   . Cancer Paternal  Grandmother        ovarian  . Hypertension Paternal Grandfather   . Heart disease Paternal Grandfather   . Diabetes Paternal Grandfather   . Hyperlipidemia Brother     Social History Social History   Tobacco Use  . Smoking status: Current Every Day Smoker    Types: Cigarettes    Last attempt to quit: 05/29/2017    Years since quitting: 0.1  . Smokeless tobacco: Never Used  . Tobacco comment: down to 3-4 cigarettes per day - trying to stop  Substance Use Topics  . Alcohol use: No  . Drug use: No     Allergies   Aspirin; Oxycodone-acetaminophen; and Tramadol hcl   Review of Systems Review of Systems All systems reviewed and negative, other than as noted in HPI.   Physical Exam Updated Vital Signs BP 110/89 (BP Location: Right Arm)   Pulse 70   Resp 19   Ht 5' 7.5" (1.715 m)   Wt 94.3 kg (208 lb)   SpO2 99%   BMI 32.10 kg/m   Physical Exam  Constitutional: She is oriented to person, place, and time. She appears well-developed and well-nourished. No distress.  HENT:  Head: Normocephalic and atraumatic.  Eyes: Conjunctivae and EOM are normal. Pupils are equal, round, and reactive to light. Right eye exhibits no discharge. Left eye exhibits no discharge.  Neck: Neck supple.  No nuchal rigidity  Cardiovascular: Normal rate, regular rhythm and normal heart sounds. Exam reveals no gallop and no friction rub.  No murmur heard. Pulmonary/Chest: Effort normal and breath sounds normal. No respiratory distress.  Abdominal: Soft. She exhibits no distension. There is no tenderness.  Musculoskeletal: She exhibits no edema or tenderness.  Neurological: She is alert and oriented to person, place, and time. No cranial nerve deficit. She exhibits normal muscle tone. Coordination normal.  Skin: Skin is warm and dry.  Psychiatric: She has a normal mood and affect. Her behavior is normal. Thought content normal.  Nursing note and vitals reviewed.    ED Treatments / Results   Labs (all labs ordered are listed, but only abnormal results are displayed) Labs Reviewed - No data to display  EKG  EKG Interpretation None       Radiology No results found.  Procedures Procedures (including critical care time)  Medications Ordered in ED Medications  sodium chloride 0.9 % bolus 1,000 mL (0 mLs Intravenous Stopped 08/03/17 1100)  magnesium sulfate IVPB 2 g 50 mL (0 g Intravenous Stopped 08/03/17 1023)  ketorolac (TORADOL) 30 MG/ML injection 15 mg (15 mg Intravenous Given 08/03/17 0919)  haloperidol lactate (HALDOL) injection 2.5 mg (2.5 mg Intravenous Given 08/03/17 0921)  dexamethasone (DECADRON) injection 10 mg (  10 mg Intravenous Given 08/03/17 1014)     Initial Impression / Assessment and Plan / ED Course  I have reviewed the triage vital signs and the nursing notes.  Pertinent labs & imaging results that were available during my care of the patient were reviewed by me and considered in my medical decision making (see chart for details).     46yF with headache. Suspect primary HA. Consider emergent secondary causes such as bleed, infectious or mass but doubt. There is no history of trauma. Pt has a nonfocal neurological exam. Afebrile and neck supple. No use of blood thinning medication. Consider ocular etiology such as acute angle closure glaucoma but doubt. Pt denies acute change in visual acuity and eye exam unremarkable. Doubt temporal arteritis given age, no temporal tenderness and temporal artery pulsations palpable. Doubt CO poisoning. No contacts with similar symptoms. Doubt venous thrombosis. Doubt carotid or vertebral arteries dissection. Symptoms improved with meds. Feel that can be safely discharged, but strict return precautions discussed. Outpt fu.   Final Clinical Impressions(s) / ED Diagnoses   Final diagnoses:  Bad headache    ED Discharge Orders    None       Raeford Razor, MD 08/09/17 1510

## 2017-08-24 ENCOUNTER — Telehealth: Payer: Self-pay | Admitting: Family Medicine

## 2017-08-24 ENCOUNTER — Other Ambulatory Visit: Payer: Self-pay | Admitting: Family Medicine

## 2017-08-24 NOTE — Telephone Encounter (Signed)
Patient advised to contact pharmacy she should have rx at pharmacy.

## 2017-09-04 ENCOUNTER — Other Ambulatory Visit: Payer: Self-pay | Admitting: Family

## 2017-09-04 DIAGNOSIS — K219 Gastro-esophageal reflux disease without esophagitis: Secondary | ICD-10-CM

## 2017-09-17 ENCOUNTER — Telehealth: Payer: Self-pay | Admitting: Family Medicine

## 2017-09-17 MED ORDER — MIRABEGRON ER 25 MG PO TB24: 25.0000 mg | ORAL_TABLET | Freq: Every day | ORAL | 2 refills | Status: DC

## 2017-09-17 NOTE — Telephone Encounter (Signed)
I sent Myrbetriq for the patient

## 2017-09-18 NOTE — Telephone Encounter (Signed)
Left detailed message stating that Rx for Myrbetriq sent to pharmacy per DPR and to call back with any questions or concerns

## 2017-09-25 ENCOUNTER — Telehealth: Payer: Self-pay

## 2017-09-25 NOTE — Telephone Encounter (Signed)
Medicaid non preferred Myrbetriq preferred are Oxybutynin ER Toviaz tab., and Vesicare tablet

## 2017-09-26 MED ORDER — SOLIFENACIN SUCCINATE 5 MG PO TABS: 5.0000 mg | ORAL_TABLET | Freq: Every day | ORAL | 3 refills | Status: DC

## 2017-09-26 NOTE — Telephone Encounter (Signed)
Patient aware.

## 2017-09-26 NOTE — Telephone Encounter (Signed)
Please let patient know that the Myrbetriq was not covered by her insurance, so I sent in another 1 called Vesicare, it works very similar but may have side effects of dry mouth and constipation, let us know if these become a major issue and sometimes we can find the insurance company on getting Myrbetriq if those do become a big issue

## 2017-09-26 NOTE — Addendum Note (Signed)
Addended by: Arville Care on: 09/26/2017 07:51 AM   Modules accepted: Orders

## 2017-09-26 NOTE — Telephone Encounter (Signed)
lmtcb

## 2017-10-02 ENCOUNTER — Other Ambulatory Visit: Payer: Self-pay | Admitting: Family Medicine

## 2017-10-03 NOTE — Telephone Encounter (Signed)
Last seen 07/27/17  Dr [name] 

## 2017-10-17 ENCOUNTER — Ambulatory Visit: Payer: Medicaid Other | Admitting: Family Medicine

## 2017-10-19 ENCOUNTER — Encounter: Payer: Self-pay | Admitting: Family Medicine

## 2017-10-23 ENCOUNTER — Ambulatory Visit: Payer: Medicaid Other | Admitting: Family Medicine

## 2017-10-25 ENCOUNTER — Ambulatory Visit: Payer: Medicaid Other | Admitting: Family Medicine

## 2017-10-25 ENCOUNTER — Encounter: Payer: Self-pay | Admitting: Family Medicine

## 2017-10-25 VITALS — BP 116/75 | HR 70 | Temp 97.7°F | Ht 67.5 in | Wt 206.0 lb

## 2017-10-25 DIAGNOSIS — G43009 Migraine without aura, not intractable, without status migrainosus: Secondary | ICD-10-CM

## 2017-10-25 DIAGNOSIS — E785 Hyperlipidemia, unspecified: Secondary | ICD-10-CM

## 2017-10-25 DIAGNOSIS — Z720 Tobacco use: Secondary | ICD-10-CM | POA: Diagnosis not present

## 2017-10-25 DIAGNOSIS — K219 Gastro-esophageal reflux disease without esophagitis: Secondary | ICD-10-CM | POA: Diagnosis not present

## 2017-10-25 DIAGNOSIS — E039 Hypothyroidism, unspecified: Secondary | ICD-10-CM

## 2017-10-25 DIAGNOSIS — E669 Obesity, unspecified: Secondary | ICD-10-CM | POA: Diagnosis not present

## 2017-10-25 MED ORDER — VARENICLINE TARTRATE 1 MG PO TABS: 1.0000 mg | ORAL_TABLET | Freq: Two times a day (BID) | ORAL | 1 refills | Status: DC

## 2017-10-25 MED ORDER — VARENICLINE TARTRATE 0.5 MG X 11 & 1 MG X 42 PO MISC: ORAL | 0 refills | Status: DC

## 2017-10-25 MED ORDER — HYDROCODONE-ACETAMINOPHEN 5-325 MG PO TABS: 1.0000 | ORAL_TABLET | Freq: Every day | ORAL | 0 refills | Status: DC | PRN

## 2017-10-25 NOTE — Progress Notes (Addendum)
BP 116/75   Pulse 70   Temp 97.7 F (36.5 C) (Oral)   Ht 5' 7.5" (1.715 m)   Wt 206 lb (93.4 kg)   BMI 31.79 kg/m    Subjective:    Patient ID: Pam Chen, female    DOB: 05-03-1971, 46 y.o.   MRN: [id number]  HPI: Pam Chen is a 47 y.o. female presenting on 10/25/2017 for Follow up migraines (migraines much better, did see neurologist) and Medication refills   HPI Hypothyroidism recheck Patient is coming in for thyroid recheck today as well. They deny any issues with hair changes or heat or cold problems or diarrhea or constipation. They deny any chest pain or palpitations. They are currently on levothyroxine 81mcrograms   Hyperlipidemia Patient is coming in for recheck of his hyperlipidemia. The patient is currently taking no medication because she is been intolerant of statins before, we are monitoring and she is trying diet and exercise. They deny any issues with myalgias or history of liver damage from it. They deny any focal numbness or weakness or chest pain.   GERD Patient is currently on omeprazole.  She denies any major symptoms or abdominal pain or belching or burping. She denies any blood in her stool or lightheadedness or dizziness.   Migraine recheck Patient says her migraines been doing well and she is having them very infrequently now and when she does have them they are very mild and controlled.  She did see the neurologist and feels like things are going very well in that department.  Patient is ready to quit smoking wants to try Chantix, will send that in for her.  Relevant past medical, surgical, family and social history reviewed and updated as indicated. Interim medical history since our last visit reviewed. Allergies and medications reviewed and updated.  Review of Systems  Constitutional: Negative for chills and fever.  HENT: Positive for congestion. Negative for ear discharge and ear pain.   Eyes: Negative for redness and visual disturbance.    Respiratory: Negative for chest tightness and shortness of breath.   Cardiovascular: Negative for chest pain and leg swelling.  Genitourinary: Negative for difficulty urinating and dysuria.  Musculoskeletal: Negative for back pain and gait problem.  Skin: Negative for rash.  Neurological: Positive for headaches. Negative for light-headedness.  Psychiatric/Behavioral: Negative for agitation, behavioral problems and sleep disturbance. The patient is not nervous/anxious.   All other systems reviewed and are negative.   Per HPI unless specifically indicated above   Allergies as of 10/25/2017      Reactions   Aspirin Other (See Comments)   Upset tummy.  Can take ibuprofen.   Oxycodone-acetaminophen Anxiety   Can take vicodin   Tramadol Hcl Nausea Only      Medication List        Accurate as of 10/25/17  8:34 AM. Always use your most recent med list.          buPROPion 300 MG 24 hr tablet Commonly known as:  WELLBUTRIN XL Take 300 mg by mouth daily.   cyclobenzaprine 10 MG tablet Commonly known as:  FLEXERIL TAKE 1 TABLET BY MOUTH TWICE A DAY   diazepam 5 MG tablet Commonly known as:  VALIUM Take 5 mg by mouth daily as needed for anxiety.   HM VITAMIN D3 4000 units Caps Generic drug:  Cholecalciferol Take 4,000 Units by mouth daily.   HYDROcodone-acetaminophen 5-325 MG tablet Commonly known as:  NORCO/VICODIN Take 1 tablet by mouth daily as needed  for moderate pain. Do not refill until 60 days from prescription date   levothyroxine 75 MCG tablet Commonly known as:  SYNTHROID, LEVOTHROID Take 1 tablet (75 mcg total) by mouth daily.   multivitamin with minerals Tabs tablet Take 1 tablet by mouth daily.   omeprazole 20 MG capsule Commonly known as:  PRILOSEC TAKE 1 CAPSULE (20 MG TOTAL) BY MOUTH 2 (TWO) TIMES DAILY BEFORE A MEAL.   ondansetron 4 MG disintegrating tablet Commonly known as:  ZOFRAN ODT Take 1 tablet (4 mg total) by mouth every 8 (eight) hours as  needed for nausea or vomiting.   promethazine 12.5 MG tablet Commonly known as:  PHENERGAN TAKE 1 TABLET (12.5 MG TOTAL) BY MOUTH EVERY 6 (SIX) HOURS AS NEEDED FOR NAUSEA OR VOMITING.   propranolol ER 120 MG 24 hr capsule Commonly known as:  INDERAL LA Take 1 capsule (120 mg total) by mouth daily.   rizatriptan 10 MG tablet Commonly known as:  MAXALT Take 1 tablet (10 mg total) by mouth as needed for migraine. May repeat in 2 hours if needed   solifenacin 5 MG tablet Commonly known as:  VESICARE Take 1 tablet (5 mg total) by mouth daily.   topiramate 100 MG tablet Commonly known as:  TOPAMAX Take 1 tablet (100 mg total) by mouth 2 (two) times daily.   varenicline 0.5 MG X 11 & 1 MG X 42 tablet Commonly known as:  CHANTIX STARTING MONTH PAK Take one 0.5 mg tablet by mouth daily for 3 days, then one 0.5 mg tablet twice daily for 4 days, then one 1 mg tablet twice daily.   varenicline 1 MG tablet Commonly known as:  CHANTIX CONTINUING MONTH PAK Take 1 tablet (1 mg total) by mouth 2 (two) times daily.          Objective:    BP 116/75   Pulse 70   Temp 97.7 F (36.5 C) (Oral)   Ht 5' 7.5" (1.715 m)   Wt 206 lb (93.4 kg)   BMI 31.79 kg/m   Wt Readings from Last 3 Encounters:  10/25/17 206 lb (93.4 kg)  08/07/17 207 lb 8 oz (94.1 kg)  08/03/17 208 lb (94.3 kg)    Physical Exam  Constitutional: She is oriented to person, place, and time. She appears well-developed and well-nourished. No distress.  Eyes: Conjunctivae are normal.  Cardiovascular: Normal rate, regular rhythm, normal heart sounds and intact distal pulses.  No murmur heard. Pulmonary/Chest: Effort normal and breath sounds normal. No respiratory distress. She has no wheezes.  Musculoskeletal: Normal range of motion. She exhibits no edema or tenderness.  Neurological: She is alert and oriented to person, place, and time. Coordination normal.  Skin: Skin is warm and dry. No rash noted. She is not diaphoretic.   Psychiatric: She has a normal mood and affect. Her behavior is normal.  Nursing note and vitals reviewed.       Assessment & Plan:   Problem List Items Addressed This Visit      Cardiovascular and Mediastinum   Migraine - Primary   Relevant Medications   HYDROcodone-acetaminophen (NORCO/VICODIN) 5-325 MG tablet     Digestive   GERD (gastroesophageal reflux disease)     Endocrine   Hypothyroid   Relevant Orders   CMP14+EGFR   TSH     Other   Obesity (BMI 30.0-34.9)   Hyperlipidemia LDL goal <130   Relevant Orders   Lipid panel    Other Visit Diagnoses  Tobacco abuse       Relevant Medications   varenicline (CHANTIX STARTING MONTH PAK) 0.5 MG X 11 & 1 MG X 42 tablet   varenicline (CHANTIX CONTINUING MONTH PAK) 1 MG tablet       Follow up plan: Return in about 6 months (around 04/27/2018), or if symptoms worsen or fail to improve, for Thyroid recheck.  Counseling provided for all of the vaccine components Orders Placed This Encounter  Procedures  . CMP14+EGFR  . Lipid panel  . TSH    Caryl Pina, MD [facility] 10/25/2017, 8:34 AM

## 2017-10-26 ENCOUNTER — Telehealth: Payer: Self-pay

## 2017-10-26 LAB — LIPID PANEL
CHOLESTEROL TOTAL: 170 mg/dL (ref 100–199)
Chol/HDL Ratio: 4.5 ratio — ABNORMAL HIGH (ref 0.0–4.4)
HDL: 38 mg/dL — ABNORMAL LOW (ref >39)
LDL Calculated: 113 mg/dL — ABNORMAL HIGH (ref 0–99)
Triglycerides: 94 mg/dL (ref 0–149)
VLDL Cholesterol Cal: 19 mg/dL (ref 5–40)

## 2017-10-26 LAB — CMP14+EGFR
ALBUMIN: 3.5 g/dL (ref 3.5–5.5)
ALK PHOS: 55 IU/L (ref 39–117)
ALT: 7 IU/L (ref 0–32)
AST: 10 IU/L (ref 0–40)
Albumin/Globulin Ratio: 1.7 (ref 1.2–2.2)
BUN / CREAT RATIO: 12 (ref 9–23)
BUN: 12 mg/dL (ref 6–24)
Bilirubin Total: 0.2 mg/dL (ref 0.0–1.2)
CALCIUM: 8.9 mg/dL (ref 8.7–10.2)
CO2: 22 mmol/L (ref 20–29)
CREATININE: 1 mg/dL (ref 0.57–1.00)
Chloride: 106 mmol/L (ref 96–106)
GFR calc Af Amer: 79 mL/min/{1.73_m2} (ref >59)
GFR calc non Af Amer: 69 mL/min/{1.73_m2} (ref >59)
GLUCOSE: 84 mg/dL (ref 65–99)
Globulin, Total: 2.1 g/dL (ref 1.5–4.5)
Potassium: 3.9 mmol/L (ref 3.5–5.2)
Sodium: 142 mmol/L (ref 134–144)
Total Protein: 5.6 g/dL — ABNORMAL LOW (ref 6.0–8.5)

## 2017-10-26 LAB — TSH: TSH: 2.06 u[IU]/mL (ref 0.450–4.500)

## 2017-10-26 NOTE — Telephone Encounter (Signed)
Patient called to let you know that [facility] only got the Hydrocodone prescription for July.  I checked her chart and it does look like a prescription was written for 10/25/17 with instructions not to fill for 60 days.  Can you please send the other 2 prescriptions for her.  She is aware you are out of the office until Monday.

## 2017-10-29 MED ORDER — HYDROCODONE-ACETAMINOPHEN 5-325 MG PO TABS: 1.0000 | ORAL_TABLET | Freq: Every day | ORAL | 0 refills | Status: DC | PRN

## 2017-10-29 NOTE — Telephone Encounter (Signed)
Patient aware that rxs sent to pharmacy.  

## 2017-10-29 NOTE — Telephone Encounter (Signed)
It appears this happened because somebody else deleted the other prescriptions off and I did not realize that I was giving her 3 separate prescriptions because they were deleted Arville CareJoshua [name], MD [facility] 10/29/2017, 7:47 AM

## 2017-10-31 ENCOUNTER — Other Ambulatory Visit: Payer: Self-pay | Admitting: Family Medicine

## 2017-11-08 ENCOUNTER — Telehealth: Payer: Self-pay | Admitting: *Deleted

## 2017-11-08 ENCOUNTER — Encounter: Payer: Self-pay | Admitting: *Deleted

## 2017-11-08 ENCOUNTER — Ambulatory Visit: Payer: Medicaid Other | Admitting: Neurology

## 2017-11-08 NOTE — Telephone Encounter (Signed)
Pt called stating she got home late and forgot to set an alarm for the appt this morning. Stating all medication have been working fine and she feels better. Pt requesting a call back to discuss if she will need to f/u or not

## 2017-11-08 NOTE — Telephone Encounter (Signed)
Spoke to patient - states her migraines are stable.  Her PCP is currently providing refills on all her medications other than Topamax.  She is going to ask him to take over the management of it as well.

## 2017-11-08 NOTE — Telephone Encounter (Signed)
No showed follow up appointment. 

## 2017-11-09 ENCOUNTER — Encounter: Payer: Self-pay | Admitting: Neurology

## 2017-11-24 ENCOUNTER — Other Ambulatory Visit: Payer: Self-pay | Admitting: Family Medicine

## 2017-11-24 DIAGNOSIS — Z720 Tobacco use: Secondary | ICD-10-CM

## 2017-11-26 ENCOUNTER — Other Ambulatory Visit: Payer: Self-pay | Admitting: Family Medicine

## 2017-11-26 DIAGNOSIS — Z720 Tobacco use: Secondary | ICD-10-CM

## 2017-12-02 ENCOUNTER — Other Ambulatory Visit: Payer: Self-pay | Admitting: Family Medicine

## 2017-12-02 DIAGNOSIS — K219 Gastro-esophageal reflux disease without esophagitis: Secondary | ICD-10-CM

## 2018-01-03 ENCOUNTER — Encounter: Payer: Self-pay | Admitting: Family Medicine

## 2018-01-03 ENCOUNTER — Ambulatory Visit: Payer: Medicaid Other | Admitting: Family Medicine

## 2018-01-03 ENCOUNTER — Telehealth: Payer: Self-pay

## 2018-01-03 VITALS — BP 118/76 | HR 69 | Temp 97.5°F | Ht 67.5 in | Wt 209.4 lb

## 2018-01-03 DIAGNOSIS — M542 Cervicalgia: Secondary | ICD-10-CM

## 2018-01-03 MED ORDER — MELOXICAM 15 MG PO TABS: 15.0000 mg | ORAL_TABLET | Freq: Every day | ORAL | 2 refills | Status: DC

## 2018-01-03 MED ORDER — DICLOFENAC SODIUM 75 MG PO TBEC: 75.0000 mg | DELAYED_RELEASE_TABLET | Freq: Two times a day (BID) | ORAL | 0 refills | Status: DC

## 2018-01-03 MED ORDER — PREDNISONE 10 MG PO TABS: ORAL_TABLET | ORAL | 0 refills | Status: DC

## 2018-01-03 NOTE — Telephone Encounter (Signed)
Please let patient know that apparently diclofenac was not covered by her insurance and I have sent meloxicam which is a once a day alternative.

## 2018-01-03 NOTE — Telephone Encounter (Signed)
Medicaid non preferred Diclofenac tablet  Preferred are Ibuprofen tab., indomethacin cap., ketorolac tab., meloxicam tab., naproxen tab., and sulindac tab.

## 2018-01-03 NOTE — Telephone Encounter (Signed)
Pt aware.

## 2018-01-06 ENCOUNTER — Encounter: Payer: Self-pay | Admitting: Family Medicine

## 2018-01-06 NOTE — Progress Notes (Signed)
Chief Complaint  Patient presents with  . Neck Pain    HPI  Patient presents today for several days of increasing pain in the posterior neck. Dull ache. 7/10 severity. Up to 9/10 when at work. Hurts to turn head either way.   PMH: Smoking status noted ROS: Per HPI  Objective: BP 118/76   Pulse 69   Temp (!) 97.5 F (36.4 C) (Oral)   Ht 5' 7.5" (1.715 m)   Wt 209 lb 6 oz (95 kg)   BMI 32.31 kg/m  Gen: NAD, alert, cooperative with exam HEENT: NCAT, EOMI, PERRL CV: RRR, good S1/S2, no murmur Resp: CTABL, no wheezes, non-labored Ext: No edema, warm. Tender spasm noted at the posterior cervical musculature. Tight at the superior trapezius bilat. Neuro: Alert and oriented, No gross deficits  Assessment and plan:  1. Neck pain     Meds ordered this encounter  Medications  . predniSONE (DELTASONE) 10 MG tablet    Sig: Take 5 PO x 3 days, then 4 PO x 3 days, then 3 PO x 3 days, then 2 PO x 3 days then 1 PO x 3 days.    Dispense:  45 tablet    Refill:  0  . DISCONTD: diclofenac (VOLTAREN) 75 MG EC tablet    Sig: Take 1 tablet (75 mg total) by mouth 2 (two) times daily.    Dispense:  60 tablet    Refill:  0    Orders Placed This Encounter  Procedures  . Ambulatory referral to Physical Therapy    Referral Priority:   Routine    Referral Type:   Physical Medicine    Referral Reason:   Specialty Services Required    Requested Specialty:   Physical Therapy    Number of Visits Requested:   1    Follow up as needed.  Mechele ClaudeWarren Drew Herman, MD

## 2018-01-08 ENCOUNTER — Other Ambulatory Visit: Payer: Self-pay | Admitting: Family Medicine

## 2018-01-15 ENCOUNTER — Other Ambulatory Visit: Payer: Self-pay

## 2018-01-15 ENCOUNTER — Ambulatory Visit: Payer: Medicaid Other | Attending: Family Medicine | Admitting: Physical Therapy

## 2018-01-15 DIAGNOSIS — R293 Abnormal posture: Secondary | ICD-10-CM | POA: Insufficient documentation

## 2018-01-15 DIAGNOSIS — M542 Cervicalgia: Secondary | ICD-10-CM | POA: Insufficient documentation

## 2018-01-15 NOTE — Therapy (Signed)
[facility] [address] [place], [place], [address] Phone: [phone]   Fax:  [phone]  Physical Therapy Evaluation  Patient Details  Name: Pam Chen MRN: [id number] Date of Birth: 09-28-1970 Referring Provider: Mechele Claude MD   Encounter Date: 01/15/2018  PT End of Session - 01/15/18 1336    Visit Number  1    Number of Visits  12    Date for PT Re-Evaluation  03/05/18    PT Start Time  0103    PT Stop Time  0133    PT Time Calculation (min)  30 min    Activity Tolerance  Patient tolerated treatment well    Behavior During Therapy  [facility] for tasks assessed/performed       Past Medical History:  Diagnosis Date  . Arthritis   . Diverticulitis   . GERD (gastroesophageal reflux disease)   . Kidney stones   . Migraine   . Rotator cuff injury   . Sleep apnea    CPAP  . Tennis elbow   . Thyroid disease     Past Surgical History:  Procedure Laterality Date  . ABDOMINAL HYSTERECTOMY    . COLON SURGERY     diverticulitis  . LITHOTRIPSY    . SLEEVE GASTROPLASTY      There were no vitals filed for this visit.   Subjective Assessment - 01/15/18 1340    Subjective  The patient presents to OPPT with c/o neck pain over the last few weeks.  Her pain is rated at a 6/10 and goes between her shoulder blades.  Pain can go to high levels with work-related activites.  As of late she has also noticed increased low back pain (she has had chronic low back pain for 4-5 years) which shefeels might be related to her job.Heat and ice decrease pain while bending, lifting and standing on concrete floors increases pain.    Pertinent History  Arhritis, tennis elbow and RTC injury.    Patient Stated Goals  Reduce neck pain.    Currently in Pain?  Yes    Pain Score  5     Pain Location  Neck    Pain Orientation  Right;Left    Pain Descriptors / Indicators  Aching;Sharp    Pain Type  Acute pain    Pain Onset  1 to 4 weeks ago    Pain  Frequency  Constant    Aggravating Factors   See above.    Pain Relieving Factors  See above.         [facility] PT Assessment - 01/15/18 0001      Assessment   Medical Diagnosis  Neck pain.    Referring Provider  Mechele Claude MD    Onset Date/Surgical Date  --   2-3 weeks.     Precautions   Precautions  None      Restrictions   Weight Bearing Restrictions  No      Balance Screen   Has the patient fallen in the past 6 months  No    Has the patient had a decrease in activity level because of a fear of falling?   No    Is the patient reluctant to leave their home because of a fear of falling?   No      Home Public house manager residence      Prior Function   Level of Independence  Independent      Posture/Postural Control  Posture/Postural Control  Postural limitations    Postural Limitations  Rounded Shoulders;Forward head      ROM / Strength   AROM / PROM / Strength  AROM;Strength      AROM   Overall AROM Comments  Right active cervical rotation= 70 degrees and left= 65 degrees.      Strength   Overall Strength Comments  Normal bilateral UE strength.      Palpation   Palpation comment  Tender to palpation over right UT.  Tender also with palpation at C7 spinous process and diffusely over scapular musculature (between shoulder blades).      Special Tests   Other special tests  Bilateral UE DTR's= 1+ to 2+/4+.      Ambulation/Gait   Gait Comments  WNL.                Objective measurements completed on examination: See above findings.                PT Short Term Goals - 01/15/18 1417      PT SHORT TERM GOAL #1   Title  Independent with an initial HEP.    Baseline  No knowledge of appropriate ther ex.    Time  2    Period  Weeks    Status  New      PT SHORT TERM GOAL #2   Title  Left active cervical rotation to 70 degrees.    Baseline  Currently 65 degrees.    Time  2    Period  Weeks    Status  New         PT Long Term Goals - 01/15/18 1418      PT LONG TERM GOAL #1   Title  Independent with an advanced HEP.    Baseline  No knowledge of appropriate ther ex.    Time  6    Period  Weeks    Status  New      PT LONG TERM GOAL #2   Title  Bilateral active cervical rotation to 80 degrees to turn her head more easily while driving.    Baseline  Left= 65 degrees and right= 70 degrees.    Time  6    Period  Weeks    Status  New      PT LONG TERM GOAL #3   Title  Perform work related activites with pain not > 2-3/10.    Baseline  Pain rises to 6 or greater wiht work-related activites.    Time  6    Period  Weeks    Status  New             Plan - 01/15/18 1413    Clinical Impression Statement  The patient presents to the clinic today with c/o neck pain and pain between her shoulder blades.  She was found to have a loss of cervical range of motion and has a trigger point in her right UT and diffuse scapular muscle pain. Patient will benefit from skilled physical therapy intervention to address deficits.     History and Personal Factors relevant to plan of care:  Arthritis.  Chronic low back pain.    Clinical Presentation  Stable    Clinical Decision Making  Low    Rehab Potential  Excellent    PT Frequency  2x / week    PT Duration  6 weeks    PT Treatment/Interventions  ADLs/Self Care Home Management;Cryotherapy;Electrical Stimulation;Ultrasound;Traction;Therapeutic activities;Therapeutic  exercise;Passive range of motion;Patient/family education;Manual techniques;Dry needling    PT Next Visit Plan  STW/M to right cervical and scapular musculature; chin tucks and scapular retraction; combo e'stim/U/S; scapular strengthening.    Consulted and Agree with Plan of Care  Patient       Patient will benefit from skilled therapeutic intervention in order to improve the following deficits and impairments:  Decreased activity tolerance, Postural dysfunction, Pain, Decreased range of  motion  Visit Diagnosis: Cervicalgia - Plan: PT plan of care cert/re-cert  Abnormal posture - Plan: PT plan of care cert/re-cert     Problem List Patient Active Problem List   Diagnosis Date Noted  . Hyperlipidemia LDL goal <130 05/01/2017  . Current smoker 03/07/2017  . Obesity (BMI 30.0-34.9) 02/02/2017  . Hypothyroid 02/02/2017  . GERD (gastroesophageal reflux disease) 02/02/2017  . Migraine 02/02/2017  . Bipolar 1 disorder, depressed (HCC) 02/02/2017    Shalamar Crays, [place] MPT 01/15/2018, 2:23 PM  [facility] [address] [place], [place], [address] Phone: [phone]   Fax:  [phone]  Name: Pam BeadyJodie Chen MRN: [id number] Date of Birth: 04/15/1971

## 2018-01-17 ENCOUNTER — Ambulatory Visit: Payer: Medicaid Other | Admitting: Family Medicine

## 2018-01-17 ENCOUNTER — Encounter: Payer: Self-pay | Admitting: Family Medicine

## 2018-01-17 VITALS — BP 127/89 | HR 94 | Temp 97.6°F | Ht 67.5 in | Wt 212.0 lb

## 2018-01-17 DIAGNOSIS — H6122 Impacted cerumen, left ear: Secondary | ICD-10-CM

## 2018-01-17 DIAGNOSIS — K219 Gastro-esophageal reflux disease without esophagitis: Secondary | ICD-10-CM | POA: Diagnosis not present

## 2018-01-17 DIAGNOSIS — G43009 Migraine without aura, not intractable, without status migrainosus: Secondary | ICD-10-CM

## 2018-01-17 MED ORDER — HYDROCODONE-ACETAMINOPHEN 5-325 MG PO TABS: 1.0000 | ORAL_TABLET | Freq: Every day | ORAL | 0 refills | Status: DC | PRN

## 2018-01-17 MED ORDER — GALCANEZUMAB-GNLM 120 MG/ML ~~LOC~~ SOAJ: 120.0000 mg | SUBCUTANEOUS | 3 refills | Status: DC

## 2018-01-17 NOTE — Progress Notes (Signed)
BP 127/89   Pulse 94   Temp 97.6 F (36.4 C)   Ht 5' 7.5" (1.715 m)   Wt 212 lb (96.2 kg)   BMI 32.71 kg/m    Subjective:    Patient ID: Pam Chen, female    DOB: 05/13/1971, 47 y.o.   MRN: [id number]  HPI: Pam Chen is a 47 y.o. female presenting on 01/17/2018 for Follow-up   HPI Migraine headaches Patient is coming in with complaints of migraines.  She has been having these going on over the past week, although she does admit that she started smoking again and that may be causing some of the issues.  Her migraines are the same places that they always are in the front and the right temple.  She says that the Maxalt is starting to work and reduce this current episode down but she still has been having them almost weekly.  She says is been improved from previous but is still not where she would like it to be.  Patient wears earplugs for work and she feels like her left ear is been plugged up for almost a week and she wants to get it washed out.  She says she cannot hear out of the ear very well.  She denies any fevers or chills or pain from it.  Nausea and epigastric abdominal discomfort Patient has been having nausea and epigastric abdominal discomfort that is been going on over the past month.  She says she did start smoking back up and her gastroenterologist who did her surgery recommended that she not do it as it might cause issues with her stomach.  She is getting concerned because she feels like it is there most of the time now and she just feels nauseous and has this upper abdominal pain and that she is not improving.  She denies any fevers or chills or vomiting or diarrhea or blood in her stool but just has this nausea and epigastric abdominal discomfort and is concerned about a possible ulcer.  She is going to go to her gastroenterologist today  Relevant past medical, surgical, family and social history reviewed and updated as indicated. Interim medical history since our last  visit reviewed. Allergies and medications reviewed and updated.  Review of Systems  Constitutional: Negative for chills and fever.  HENT: Positive for hearing loss (Left ear). Negative for congestion, ear discharge and ear pain.   Eyes: Negative for redness and visual disturbance.  Respiratory: Negative for chest tightness and shortness of breath.   Cardiovascular: Negative for chest pain and leg swelling.  Gastrointestinal: Positive for abdominal pain and nausea. Negative for anal bleeding, blood in stool, constipation, diarrhea and vomiting.  Musculoskeletal: Negative for back pain and gait problem.  Skin: Negative for rash.  Neurological: Positive for headaches. Negative for dizziness, light-headedness and numbness.  Psychiatric/Behavioral: Negative for agitation and behavioral problems.  All other systems reviewed and are negative.   Per HPI unless specifically indicated above   Allergies as of 01/17/2018      Reactions   Aspirin Other (See Comments)   Upset tummy.  Can take ibuprofen.   Oxycodone-acetaminophen Anxiety   Can take vicodin   Tramadol Hcl Nausea Only      Medication List        Accurate as of 01/17/18  2:08 PM. Always use your most recent med list.          buPROPion 300 MG 24 hr tablet Commonly known as:  WELLBUTRIN XL  Take 300 mg by mouth daily.   cyclobenzaprine 10 MG tablet Commonly known as:  FLEXERIL TAKE 1 TABLET BY MOUTH TWICE A DAY   diazepam 5 MG tablet Commonly known as:  VALIUM Take 5 mg by mouth daily as needed for anxiety.   Galcanezumab-gnlm 120 MG/ML Soaj Inject 120 mg into the skin every 30 (thirty) days. Give 240 mg first time and then 120 every 30 days   HM VITAMIN D3 4000 units Caps Generic drug:  Cholecalciferol Take 4,000 Units by mouth daily.   HYDROcodone-acetaminophen 5-325 MG tablet Commonly known as:  NORCO/VICODIN Take 1 tablet by mouth daily as needed for moderate pain.   HYDROcodone-acetaminophen 5-325 MG  tablet Commonly known as:  NORCO/VICODIN Take 1 tablet by mouth daily as needed for moderate pain. Do not refill until 60 days from prescription date   HYDROcodone-acetaminophen 5-325 MG tablet Commonly known as:  NORCO/VICODIN Take 1 tablet by mouth daily as needed for moderate pain. Do not refill until 30 days from prescription date   levothyroxine 75 MCG tablet Commonly known as:  SYNTHROID, LEVOTHROID Take 1 tablet (75 mcg total) by mouth daily.   meloxicam 15 MG tablet Commonly known as:  MOBIC Take 1 tablet (15 mg total) by mouth daily.   multivitamin with minerals Tabs tablet Take 1 tablet by mouth daily.   ondansetron 4 MG disintegrating tablet Commonly known as:  ZOFRAN-ODT Take 1 tablet (4 mg total) by mouth every 8 (eight) hours as needed for nausea or vomiting.   predniSONE 10 MG tablet Commonly known as:  DELTASONE Take 5 PO x 3 days, then 4 PO x 3 days, then 3 PO x 3 days, then 2 PO x 3 days then 1 PO x 3 days.   propranolol ER 120 MG 24 hr capsule Commonly known as:  INDERAL LA Take 1 capsule (120 mg total) by mouth daily.   rizatriptan 10 MG tablet Commonly known as:  MAXALT TAKE 1 TABLET (10 MG TOTAL) BY MOUTH AS NEEDED FOR MIGRAINE. MAY REPEAT IN 2 HOURS IF NEEDED   solifenacin 5 MG tablet Commonly known as:  VESICARE Take 1 tablet (5 mg total) by mouth daily.   topiramate 100 MG tablet Commonly known as:  TOPAMAX Take 1 tablet (100 mg total) by mouth 2 (two) times daily.   varenicline 0.5 MG X 11 & 1 MG X 42 tablet Commonly known as:  CHANTIX PAK TAKE AS DIRECTED INSIDE PACKAGE          Objective:    BP 127/89   Pulse 94   Temp 97.6 F (36.4 C)   Ht 5' 7.5" (1.715 m)   Wt 212 lb (96.2 kg)   BMI 32.71 kg/m   Wt Readings from Last 3 Encounters:  01/17/18 212 lb (96.2 kg)  01/03/18 209 lb 6 oz (95 kg)  10/25/17 206 lb (93.4 kg)    Physical Exam  Constitutional: She is oriented to person, place, and time. She appears well-developed and  well-nourished. No distress.  Eyes: Pupils are equal, round, and reactive to light. Conjunctivae are normal.  Cardiovascular: Normal rate, regular rhythm, normal heart sounds and intact distal pulses.  No murmur heard. Pulmonary/Chest: Effort normal and breath sounds normal. No respiratory distress. She has no wheezes.  Abdominal: Soft. Bowel sounds are normal. She exhibits no distension and no mass. There is tenderness (Epigastric abdominal discomfort). There is no rebound and no guarding.  Neurological: She is alert and oriented to person, place, and  time. No sensory deficit. She exhibits normal muscle tone. Coordination normal.  Skin: Skin is warm and dry. No rash noted. She is not diaphoretic.  Psychiatric: She has a normal mood and affect. Her behavior is normal.  Nursing note and vitals reviewed.       Assessment & Plan:   Problem List Items Addressed This Visit      Cardiovascular and Mediastinum   Migraine - Primary   Relevant Medications   HYDROcodone-acetaminophen (NORCO) 5-325 MG tablet   HYDROcodone-acetaminophen (NORCO/VICODIN) 5-325 MG tablet   HYDROcodone-acetaminophen (NORCO/VICODIN) 5-325 MG tablet   Galcanezumab-gnlm (EMGALITY) 120 MG/ML SOAJ     Digestive   GERD (gastroesophageal reflux disease)    Other Visit Diagnoses    Impacted cerumen of left ear       Patient wears earplugs at work and feels like she has cerumen in her ear from that      Will try to send patient Emgality for migraine prevention as she is already on Topamax and propranolol together.  Also recommended for her to give her neurologist to call back up as well.  Refilled hydrocodone which she uses for back pain and abdominal pain  Follow up plan: Return in about 3 months (around 04/19/2018), or if symptoms worsen or fail to improve, for Migraine and GERD follow-up.  Counseling provided for all of the vaccine components No orders of the defined types were placed in this  encounter.   Arville Care, MD [facility] 01/17/2018, 2:08 PM

## 2018-01-19 ENCOUNTER — Other Ambulatory Visit: Payer: Self-pay | Admitting: Family Medicine

## 2018-01-19 DIAGNOSIS — Z720 Tobacco use: Secondary | ICD-10-CM

## 2018-01-21 NOTE — Telephone Encounter (Signed)
Last seen 01/17/18

## 2018-01-23 ENCOUNTER — Telehealth: Payer: Self-pay | Admitting: Family Medicine

## 2018-01-24 NOTE — Telephone Encounter (Signed)
Faxed to Medicaid yesterday for approval

## 2018-01-25 ENCOUNTER — Encounter: Payer: Self-pay | Admitting: Physical Therapy

## 2018-01-25 ENCOUNTER — Ambulatory Visit: Payer: Medicaid Other | Admitting: Physical Therapy

## 2018-01-25 DIAGNOSIS — M542 Cervicalgia: Secondary | ICD-10-CM

## 2018-01-25 DIAGNOSIS — R293 Abnormal posture: Secondary | ICD-10-CM

## 2018-01-25 NOTE — Therapy (Signed)
[facility] [address] [place], [place], [address] Phone: [phone]   Fax:  [phone]  Physical Therapy Treatment  Patient Details  Name: Pam Chen MRN: [id number] Date of Birth: Mar 10, 1971 Referring Provider: Mechele Claude MD   Encounter Date: 01/25/2018  PT End of Session - 01/25/18 1308    Visit Number  2    Number of Visits  12    Date for PT Re-Evaluation  03/05/18    PT Start Time  0947    PT Stop Time  1039    PT Time Calculation (min)  52 min    Activity Tolerance  Patient tolerated treatment well    Behavior During Therapy  [facility] for tasks assessed/performed       Past Medical History:  Diagnosis Date  . Arthritis   . Diverticulitis   . GERD (gastroesophageal reflux disease)   . Kidney stones   . Migraine   . Rotator cuff injury   . Sleep apnea    CPAP  . Tennis elbow   . Thyroid disease     Past Surgical History:  Procedure Laterality Date  . ABDOMINAL HYSTERECTOMY    . COLON SURGERY     diverticulitis  . LITHOTRIPSY    . SLEEVE GASTROPLASTY      There were no vitals filed for this visit.  Subjective Assessment - 01/25/18 1309    Subjective  I've been working a lot and had trouble sleeping.  In a lot of pain today.    Patient Stated Goals  Reduce neck pain.    Currently in Pain?  Yes    Pain Score  7     Pain Location  Neck    Pain Orientation  Right;Left    Pain Descriptors / Indicators  Aching;Sharp    Pain Type  Acute pain    Pain Onset  1 to 4 weeks ago                       [facility] Adult PT Treatment/Exercise - 01/25/18 0001      Modalities   Modalities  Electrical Stimulation;Moist Heat;Ultrasound      Moist Heat Therapy   Number Minutes Moist Heat  20 Minutes    Moist Heat Location  Cervical      Electrical Stimulation   Electrical Stimulation Location  Bilateral cervical/upper scapular region.    Electrical Stimulation Action  IFC    Electrical Stimulation  Parameters  80-150 Hz x 20 minutes.    Electrical Stimulation Goals  Tone;Pain      Ultrasound   Ultrasound Location  Bilateral UT's/upper scapular region.    Ultrasound Parameters  1.50 W/CM2 x 12 minutes.    Ultrasound Goals  Pain      Manual Therapy   Manual Therapy  Soft tissue mobilization    Soft tissue mobilization  STW/M x 12 minutes to bilateral UT's and upper scapular region to reduce tone.               PT Short Term Goals - 01/15/18 1417      PT SHORT TERM GOAL #1   Title  Independent with an initial HEP.    Baseline  No knowledge of appropriate ther ex.    Time  2    Period  Weeks    Status  New      PT SHORT TERM GOAL #2   Title  Left active cervical rotation to 70 degrees.  Baseline  Currently 65 degrees.    Time  2    Period  Weeks    Status  New        PT Long Term Goals - 01/15/18 1418      PT LONG TERM GOAL #1   Title  Independent with an advanced HEP.    Baseline  No knowledge of appropriate ther ex.    Time  6    Period  Weeks    Status  New      PT LONG TERM GOAL #2   Title  Bilateral active cervical rotation to 80 degrees to turn her head more easily while driving.    Baseline  Left= 65 degrees and right= 70 degrees.    Time  6    Period  Weeks    Status  New      PT LONG TERM GOAL #3   Title  Perform work related activites with pain not > 2-3/10.    Baseline  Pain rises to 6 or greater wiht work-related activites.    Time  6    Period  Weeks    Status  New            Plan - 01/25/18 1317    Clinical Impression Statement  The patient did well today.  She had increased pain due to a hectic work schedule and an inability to sleep.  She had a great deal of tone today over her UT's.  She responded well to treatment today.    PT Treatment/Interventions  ADLs/Self Care Home Management;Cryotherapy;Electrical Stimulation;Ultrasound;Traction;Therapeutic activities;Therapeutic exercise;Passive range of motion;Patient/family  education;Manual techniques;Dry needling    PT Next Visit Plan  STW/M to right cervical and scapular musculature; chin tucks and scapular retraction; combo e'stim/U/S; scapular strengthening.    Consulted and Agree with Plan of Care  Patient       Patient will benefit from skilled therapeutic intervention in order to improve the following deficits and impairments:  Decreased activity tolerance, Postural dysfunction, Pain, Decreased range of motion  Visit Diagnosis: Cervicalgia  Abnormal posture     Problem List Patient Active Problem List   Diagnosis Date Noted  . Hyperlipidemia LDL goal <130 05/01/2017  . Current smoker 03/07/2017  . Obesity (BMI 30.0-34.9) 02/02/2017  . Hypothyroid 02/02/2017  . GERD (gastroesophageal reflux disease) 02/02/2017  . Migraine 02/02/2017  . Bipolar 1 disorder, depressed (HCC) 02/02/2017    Shacola Schussler, [place] MPT 01/25/2018, 1:20 PM  [facility] [address] [place], [place], [address] Phone: [phone]   Fax:  [phone]  Name: Pam Chen MRN: [id number] Date of Birth: 01/14/1971

## 2018-01-29 ENCOUNTER — Encounter: Payer: Medicaid Other | Admitting: Physical Therapy

## 2018-01-30 ENCOUNTER — Encounter: Payer: Self-pay | Admitting: Physical Therapy

## 2018-01-30 ENCOUNTER — Ambulatory Visit: Payer: Medicaid Other | Attending: Family Medicine | Admitting: Physical Therapy

## 2018-01-30 ENCOUNTER — Other Ambulatory Visit: Payer: Self-pay | Admitting: Family Medicine

## 2018-01-30 DIAGNOSIS — M542 Cervicalgia: Secondary | ICD-10-CM | POA: Insufficient documentation

## 2018-01-30 DIAGNOSIS — R293 Abnormal posture: Secondary | ICD-10-CM | POA: Diagnosis present

## 2018-01-30 DIAGNOSIS — G43009 Migraine without aura, not intractable, without status migrainosus: Secondary | ICD-10-CM

## 2018-01-30 NOTE — Therapy (Signed)
[facility] [address] [place], [place], [address] Phone: [phone]   Fax:  [phone]  Physical Therapy Treatment  Patient Details  Name: Pam Chen MRN: [id number] Date of Birth: August 15, 1970 Referring Provider: Mechele Claude MD   Encounter Date: 01/30/2018  PT End of Session - 01/30/18 1303    Visit Number  3    Number of Visits  12    Date for PT Re-Evaluation  03/05/18    PT Start Time  1306    PT Stop Time  1356    PT Time Calculation (min)  50 min    Activity Tolerance  Patient tolerated treatment well    Behavior During Therapy  [facility] for tasks assessed/performed       Past Medical History:  Diagnosis Date  . Arthritis   . Diverticulitis   . GERD (gastroesophageal reflux disease)   . Kidney stones   . Migraine   . Rotator cuff injury   . Sleep apnea    CPAP  . Tennis elbow   . Thyroid disease     Past Surgical History:  Procedure Laterality Date  . ABDOMINAL HYSTERECTOMY    . COLON SURGERY     diverticulitis  . LITHOTRIPSY    . SLEEVE GASTROPLASTY      There were no vitals filed for this visit.  Subjective Assessment - 01/30/18 1303    Subjective  Reports that her neck isn't hurting today but her low back is. Reports that she had to take some pain medication to alleviate LBP. Patient to return to work tonight after time off. Reports increased stress over the last few days with the passing of her grandfather yesterday.    Pertinent History  Arhritis, tennis elbow and RTC injury.    Patient Stated Goals  Reduce neck pain.    Currently in Pain?  No/denies         [facility] PT Assessment - 01/30/18 0001      Assessment   Medical Diagnosis  Neck pain.      Precautions   Precautions  None      Restrictions   Weight Bearing Restrictions  No      ROM / Strength   AROM / PROM / Strength  AROM      AROM   Overall AROM   Deficits    AROM Assessment Site  Cervical    Cervical - Right Rotation  60     Cervical - Left Rotation  55                   [facility] Adult PT Treatment/Exercise - 01/30/18 0001      Exercises   Exercises  Neck      Neck Exercises: Machines for Strengthening   UBE (Upper Arm Bike)  120 RPM x6 min    forward/backward with VCs postural technique     Neck Exercises: Standing   Neck Retraction  10 reps;5 secs    Upper Extremity D2  Flexion;15 reps    Other Standing Exercises  W back with chin tuck x15 reps      Modalities   Modalities  Electrical Stimulation;Moist Heat      Moist Heat Therapy   Number Minutes Moist Heat  15 Minutes    Moist Heat Location  Cervical      Electrical Stimulation   Electrical Stimulation Location  Bilateral cervical/upper scapular region.    Industrial/product designer  Parameters  80-150 hz x15 min    Electrical Stimulation Goals  Tone;Pain      Manual Therapy   Manual Therapy  Soft tissue mobilization    Soft tissue mobilization  STW to B UT/              PT Education - 01/30/18 1354    Education Details  HEP- chin tuck, UT stretch    Person(s) Educated  Patient    Methods  Explanation;Demonstration;Handout    Comprehension  Verbalized understanding       PT Short Term Goals - 01/30/18 1411      PT SHORT TERM GOAL #1   Title  Independent with an initial HEP.    Baseline  No knowledge of appropriate ther ex.    Time  2    Period  Weeks    Status  On-going      PT SHORT TERM GOAL #2   Title  Left active cervical rotation to 70 degrees.    Baseline  Currently 65 degrees.    Time  2    Period  Weeks    Status  On-going        PT Long Term Goals - 01/30/18 1411      PT LONG TERM GOAL #1   Title  Independent with an advanced HEP.    Baseline  No knowledge of appropriate ther ex.    Time  6    Period  Weeks    Status  On-going      PT LONG TERM GOAL #2   Title  Bilateral active cervical rotation to 80 degrees to turn her head more easily while driving.     Baseline  Left= 65 degrees and right= 70 degrees.    Time  6    Period  Weeks    Status  On-going      PT LONG TERM GOAL #3   Title  Perform work related activites with pain not > 2-3/10.    Baseline  Pain rises to 6 or greater wiht work-related activites.    Time  6    Period  Weeks    Status  On-going            Plan - 01/30/18 1355    Clinical Impression Statement  Patient tolerated today's treatment well with no complaints of any cervical pain throughout treatment. Patient encouraged throughout treatment for correct posture via VCs and demo. Tactile cues such as towel to emphasize cervical retraction also used in today's treatment. Improved overall B cervical retraction noted and listed in today's note. Increased muscle tightness palpated in B UT region today. Normal modalities response noted following removal of the modalities.    Rehab Potential  Excellent    PT Frequency  2x / week    PT Duration  6 weeks    PT Treatment/Interventions  ADLs/Self Care Home Management;Cryotherapy;Electrical Stimulation;Ultrasound;Traction;Therapeutic activities;Therapeutic exercise;Passive range of motion;Patient/family education;Manual techniques;Dry needling    PT Next Visit Plan  Progress cervical and postural strengthening with modalities and STW PRN per MPT POC.    Consulted and Agree with Plan of Care  Patient       Patient will benefit from skilled therapeutic intervention in order to improve the following deficits and impairments:  Decreased activity tolerance, Postural dysfunction, Pain, Decreased range of motion  Visit Diagnosis: Cervicalgia  Abnormal posture     Problem List Patient Active Problem List   Diagnosis Date Noted  . Hyperlipidemia LDL goal <  130 05/01/2017  . Current smoker 03/07/2017  . Obesity (BMI 30.0-34.9) 02/02/2017  . Hypothyroid 02/02/2017  . GERD (gastroesophageal reflux disease) 02/02/2017  . Migraine 02/02/2017  . Bipolar 1 disorder, depressed  (HCC) 02/02/2017    Marvell Fuller, PTA 01/30/2018, 2:12 PM  [facility] [address] [place], [place], [address] Phone: [phone]   Fax:  [phone]  Name: Pam Chen MRN: [id number] Date of Birth: 1971/04/07

## 2018-01-31 ENCOUNTER — Ambulatory Visit: Payer: Medicaid Other | Admitting: Physical Therapy

## 2018-01-31 ENCOUNTER — Encounter: Payer: Self-pay | Admitting: Physical Therapy

## 2018-01-31 DIAGNOSIS — M542 Cervicalgia: Secondary | ICD-10-CM

## 2018-01-31 DIAGNOSIS — R293 Abnormal posture: Secondary | ICD-10-CM

## 2018-01-31 NOTE — Therapy (Addendum)
[facility] [address] [place], [place], [address] Phone: [phone]   Fax:  [phone]  Physical Therapy Treatment  Patient Details  Name: Pam Chen MRN: [id number] Date of Birth: 31-Jan-1971 Referring Provider: Mechele Claude MD   Encounter Date: 01/31/2018  PT End of Session - 01/31/18 0825    Visit Number  4    Number of Visits  12    Date for PT Re-Evaluation  03/05/18    PT Start Time  0822    PT Stop Time  0908    PT Time Calculation (min)  46 min    Activity Tolerance  Patient tolerated treatment well    Behavior During Therapy  [facility] for tasks assessed/performed       Past Medical History:  Diagnosis Date  . Arthritis   . Diverticulitis   . GERD (gastroesophageal reflux disease)   . Kidney stones   . Migraine   . Rotator cuff injury   . Sleep apnea    CPAP  . Tennis elbow   . Thyroid disease     Past Surgical History:  Procedure Laterality Date  . ABDOMINAL HYSTERECTOMY    . COLON SURGERY     diverticulitis  . LITHOTRIPSY    . SLEEVE GASTROPLASTY      There were no vitals filed for this visit.  Subjective Assessment - 01/31/18 0824    Subjective  Reports that she went back to work last night so everything is tight.    Pertinent History  Arhritis, tennis elbow and RTC injury.    Patient Stated Goals  Reduce neck pain.    Currently in Pain?  Yes    Pain Score  4     Pain Location  Neck    Pain Orientation  Right;Left    Pain Descriptors / Indicators  Discomfort    Pain Type  Acute pain    Pain Onset  1 to 4 weeks ago         [facility] PT Assessment - 01/31/18 0001      Assessment   Medical Diagnosis  Neck pain.      Precautions   Precautions  None      Restrictions   Weight Bearing Restrictions  No                   [facility] Adult PT Treatment/Exercise - 01/31/18 0001      Exercises   Exercises  Neck      Neck Exercises: Machines for Strengthening   UBE (Upper Arm Bike)  90 RM  x6 min      Modalities   Modalities  Electrical Stimulation;Moist Heat;Ultrasound      Moist Heat Therapy   Number Minutes Moist Heat  15 Minutes    Moist Heat Location  Cervical      Electrical Stimulation   Electrical Stimulation Location  Bilateral cervical/upper scapular region.    Electrical Stimulation Action  IFC    Electrical Stimulation Parameters  80-150 hz x15 min    Electrical Stimulation Goals  Tone;Pain      Ultrasound   Ultrasound Location  B UT    Ultrasound Parameters  1.5 w/cm2, 100%, 1 mhzx10 min    Ultrasound Goals  Pain      Manual Therapy   Manual Therapy  Soft tissue mobilization    Soft tissue mobilization  STW to B UT/ levator scapula to reduce muscle tightness  PT Short Term Goals - 01/31/18 1015      PT SHORT TERM GOAL #1   Title  Independent with an initial HEP.    Baseline  No knowledge of appropriate ther ex.    Time  2    Period  Weeks    Status  Achieved      PT SHORT TERM GOAL #2   Title  Left active cervical rotation to 70 degrees.    Baseline  Currently 65 degrees.    Time  2    Period  Weeks    Status  On-going        PT Long Term Goals - 01/30/18 1411      PT LONG TERM GOAL #1   Title  Independent with an advanced HEP.    Baseline  No knowledge of appropriate ther ex.    Time  6    Period  Weeks    Status  On-going      PT LONG TERM GOAL #2   Title  Bilateral active cervical rotation to 80 degrees to turn her head more easily while driving.    Baseline  Left= 65 degrees and right= 70 degrees.    Time  6    Period  Weeks    Status  On-going      PT LONG TERM GOAL #3   Title  Perform work related activites with pain not > 2-3/10.    Baseline  Pain rises to 6 or greater wiht work-related activites.    Time  6    Period  Weeks    Status  On-going            Plan - 01/31/18 0955    Clinical Impression Statement  Patient tolerated today's treatment fairly well as she arrived with increased  discomfort and tension from recert stress and returning to work. Ultimately conservative treatment completed secondary to discomfort and muscle tightness palpated in B UT region. Moderate reduction of muscle tightness in B UT with manual therapy session. Normal modalities response noted following removal of the modalities.    Rehab Potential  Excellent    PT Frequency  2x / week    PT Duration  6 weeks    PT Treatment/Interventions  ADLs/Self Care Home Management;Cryotherapy;Electrical Stimulation;Ultrasound;Traction;Therapeutic activities;Therapeutic exercise;Passive range of motion;Patient/family education;Manual techniques;Dry needling    PT Next Visit Plan  Progress cervical and postural strengthening with modalities and STW PRN per MPT POC.    Consulted and Agree with Plan of Care  Patient       Patient will benefit from skilled therapeutic intervention in order to improve the following deficits and impairments:  Decreased activity tolerance, Postural dysfunction, Pain, Decreased range of motion  Visit Diagnosis: Cervicalgia  Abnormal posture     Problem List Patient Active Problem List   Diagnosis Date Noted  . Hyperlipidemia LDL goal <130 05/01/2017  . Current smoker 03/07/2017  . Obesity (BMI 30.0-34.9) 02/02/2017  . Hypothyroid 02/02/2017  . GERD (gastroesophageal reflux disease) 02/02/2017  . Migraine 02/02/2017  . Bipolar 1 disorder, depressed (HCC) 02/02/2017    Marvell Fuller, PTA 01/31/2018, 10:16 AM  [facility] [address] [place], [place], [address] Phone: [phone]   Fax:  [phone]  Name: Richanda Darin MRN: [id number] Date of Birth: 08-Aug-1970

## 2018-02-03 ENCOUNTER — Other Ambulatory Visit: Payer: Self-pay | Admitting: Family Medicine

## 2018-02-11 ENCOUNTER — Ambulatory Visit: Payer: Medicaid Other | Admitting: Physical Therapy

## 2018-02-13 ENCOUNTER — Encounter: Payer: Medicaid Other | Admitting: Physical Therapy

## 2018-02-14 ENCOUNTER — Encounter: Payer: Self-pay | Admitting: Physical Therapy

## 2018-02-14 ENCOUNTER — Ambulatory Visit: Payer: Medicaid Other | Admitting: Physical Therapy

## 2018-02-14 DIAGNOSIS — R293 Abnormal posture: Secondary | ICD-10-CM

## 2018-02-14 DIAGNOSIS — M542 Cervicalgia: Secondary | ICD-10-CM

## 2018-02-14 NOTE — Therapy (Signed)
[facility] [address] [place], [place], [address] Phone: [phone]   Fax:  [phone]  Physical Therapy Treatment  Patient Details  Name: Seniah Lawrence MRN: [id number] Date of Birth: 1971-03-19 Referring Provider: Mechele Claude MD   Encounter Date: 02/14/2018  PT End of Session - 02/14/18 1351    Visit Number  5    Number of Visits  12    Date for PT Re-Evaluation  03/05/18    PT Start Time  1348    PT Stop Time  1433    PT Time Calculation (min)  45 min    Activity Tolerance  Patient tolerated treatment well    Behavior During Therapy  [facility] for tasks assessed/performed       Past Medical History:  Diagnosis Date  . Arthritis   . Diverticulitis   . GERD (gastroesophageal reflux disease)   . Kidney stones   . Migraine   . Rotator cuff injury   . Sleep apnea    CPAP  . Tennis elbow   . Thyroid disease     Past Surgical History:  Procedure Laterality Date  . ABDOMINAL HYSTERECTOMY    . COLON SURGERY     diverticulitis  . LITHOTRIPSY    . SLEEVE GASTROPLASTY      There were no vitals filed for this visit.  Subjective Assessment - 02/14/18 1351    Subjective  Reports working a lot recently and her work is very repitive and pushing/pulling and heavy items. Patient reports a lot of stress and fatigue but overall discomfort and soreness.    Pertinent History  Arhritis, tennis elbow and RTC injury.    Patient Stated Goals  Reduce neck pain.    Currently in Pain?  Yes    Pain Score  7     Pain Location  Generalized    Pain Descriptors / Indicators  Discomfort;Sore    Pain Type  Acute pain    Pain Onset  1 to 4 weeks ago         [facility] PT Assessment - 02/14/18 0001      Assessment   Medical Diagnosis  Neck pain.      Precautions   Precautions  None      Restrictions   Weight Bearing Restrictions  No                   [facility] Adult PT Treatment/Exercise - 02/14/18 0001      Neck Exercises:  Machines for Strengthening   UBE (Upper Arm Bike)  90 RM x6 min      Neck Exercises: Standing   Other Standing Exercises  snow angels x20 reps      Modalities   Modalities  Electrical Stimulation;Moist Heat      Moist Heat Therapy   Number Minutes Moist Heat  15 Minutes    Moist Heat Location  Cervical      Electrical Stimulation   Electrical Stimulation Location  Bilateral cervical/upper scapular region.    Electrical Stimulation Action  IFC    Electrical Stimulation Parameters  80-150 hz x15 min    Electrical Stimulation Goals  Tone;Pain      Manual Therapy   Manual Therapy  Soft tissue mobilization    Soft tissue mobilization  STW to R UT/ periscapular musculature to reduce muscle tightness               PT Short Term Goals - 01/31/18 1015  PT SHORT TERM GOAL #1   Title  Independent with an initial HEP.    Baseline  No knowledge of appropriate ther ex.    Time  2    Period  Weeks    Status  Achieved      PT SHORT TERM GOAL #2   Title  Left active cervical rotation to 70 degrees.    Baseline  Currently 65 degrees.    Time  2    Period  Weeks    Status  On-going        PT Long Term Goals - 01/30/18 1411      PT LONG TERM GOAL #1   Title  Independent with an advanced HEP.    Baseline  No knowledge of appropriate ther ex.    Time  6    Period  Weeks    Status  On-going      PT LONG TERM GOAL #2   Title  Bilateral active cervical rotation to 80 degrees to turn her head more easily while driving.    Baseline  Left= 65 degrees and right= 70 degrees.    Time  6    Period  Weeks    Status  On-going      PT LONG TERM GOAL #3   Title  Perform work related activites with pain not > 2-3/10.    Baseline  Pain rises to 6 or greater wiht work-related activites.    Time  6    Period  Weeks    Status  On-going            Plan - 02/14/18 1427    Clinical Impression Statement  Patient tolerated today's treatment fair as she arrived with  generalized pain and soreness from increased workload and stress. Limited therex session due to generalized discomfort and pain. Increased muscle tightness and tone palpable in R UT and periscapular musclature. Normal modalities response noted following removal of the modalities in supine to allow patient rest.    Rehab Potential  Excellent    PT Frequency  2x / week    PT Duration  6 weeks    PT Treatment/Interventions  ADLs/Self Care Home Management;Cryotherapy;Electrical Stimulation;Ultrasound;Traction;Therapeutic activities;Therapeutic exercise;Passive range of motion;Patient/family education;Manual techniques;Dry needling    PT Next Visit Plan  Progress cervical and postural strengthening with modalities and STW PRN per MPT POC.    Consulted and Agree with Plan of Care  Patient       Patient will benefit from skilled therapeutic intervention in order to improve the following deficits and impairments:  Decreased activity tolerance, Postural dysfunction, Pain, Decreased range of motion  Visit Diagnosis: Cervicalgia  Abnormal posture     Problem List Patient Active Problem List   Diagnosis Date Noted  . Hyperlipidemia LDL goal <130 05/01/2017  . Current smoker 03/07/2017  . Obesity (BMI 30.0-34.9) 02/02/2017  . Hypothyroid 02/02/2017  . GERD (gastroesophageal reflux disease) 02/02/2017  . Migraine 02/02/2017  . Bipolar 1 disorder, depressed (HCC) 02/02/2017    Marvell FullerKelsey P Nain Rudd, PTA 02/14/2018, 5:02 PM  [facility] [address] [place], [place], [address] Phone: [phone]   Fax:  [phone]  Name: Wyline BeadyJodie Gerhart MRN: [id number] Date of Birth: 05/22/1971

## 2018-02-15 ENCOUNTER — Ambulatory Visit: Payer: Medicaid Other | Admitting: Physical Therapy

## 2018-02-15 ENCOUNTER — Encounter: Payer: Self-pay | Admitting: Physical Therapy

## 2018-02-15 DIAGNOSIS — M542 Cervicalgia: Secondary | ICD-10-CM | POA: Diagnosis not present

## 2018-02-15 DIAGNOSIS — R293 Abnormal posture: Secondary | ICD-10-CM

## 2018-02-15 NOTE — Therapy (Addendum)
[facility] [place], [place], [address] Phone: [phone]   Fax:  [phone]  Physical Therapy Treatment PHYSICAL THERAPY DISCHARGE SUMMARY  Visits from Start of Care: 6  Current functional level related to goals / functional outcomes: See below   Remaining deficits: See goals   Education / Equipment: HEP Plan: Patient agrees to discharge.  Patient goals were not met. Patient is being discharged due to not returning since the last visit.  ?????   Gabriela Eves, PT, DPT 02/25/19   Patient Details  Name: Pam Chen MRN: [id number] Date of Birth: June 08, 1970 Referring Provider: Claretta Fraise MD   Encounter Date: 02/15/2018  PT End of Session - 02/15/18 0915    Visit Number  6    Number of Visits  12    Date for PT Re-Evaluation  03/05/18    PT Start Time  0901   e-stim and moist heat only per request   PT Stop Time  0925    PT Time Calculation (min)  24 min    Activity Tolerance  Patient tolerated treatment well    Behavior During Therapy  [facility] for tasks assessed/performed       Past Medical History:  Diagnosis Date  . Arthritis   . Diverticulitis   . GERD (gastroesophageal reflux disease)   . Kidney stones   . Migraine   . Rotator cuff injury   . Sleep apnea    CPAP  . Tennis elbow   . Thyroid disease     Past Surgical History:  Procedure Laterality Date  . ABDOMINAL HYSTERECTOMY    . COLON SURGERY     diverticulitis  . LITHOTRIPSY    . SLEEVE GASTROPLASTY      There were no vitals filed for this visit.  Subjective Assessment - 02/15/18 0905    Subjective  Patient reported she has had a rough night with pain at 9/10. Currently 7/10 in lower neck and scapulae; requested for E-stim amd moist heat only.    Pertinent History  Arhritis, tennis elbow and RTC injury.    Patient Stated Goals  Reduce neck pain.    Currently in Pain?  Yes    Pain Score  7     Pain Location  Neck    Pain  Orientation  Right;Left    Pain Descriptors / Indicators  Sharp    Pain Type  Acute pain    Pain Onset  1 to 4 weeks ago    Pain Frequency  Constant                       [facility] Adult PT Treatment/Exercise - 02/15/18 0001      Moist Heat Therapy   Number Minutes Moist Heat  20 Minutes    Moist Heat Location  Cervical      Electrical Stimulation   Electrical Stimulation Location  Bilateral cervical/upper scapular region.    Electrical Stimulation Action  IFC    Electrical Stimulation Parameters  80-150 hz x20 minutes    Electrical Stimulation Goals  Tone;Pain               PT Short Term Goals - 01/31/18 1015      PT SHORT TERM GOAL #1   Title  Independent with an initial HEP.    Baseline  No knowledge of appropriate ther ex.    Time  2    Period  Weeks    Status  Achieved  PT SHORT TERM GOAL #2   Title  Left active cervical rotation to 70 degrees.    Baseline  Currently 65 degrees.    Time  2    Period  Weeks    Status  On-going        PT Long Term Goals - 01/30/18 1411      PT LONG TERM GOAL #1   Title  Independent with an advanced HEP.    Baseline  No knowledge of appropriate ther ex.    Time  6    Period  Weeks    Status  On-going      PT LONG TERM GOAL #2   Title  Bilateral active cervical rotation to 80 degrees to turn her head more easily while driving.    Baseline  Left= 65 degrees and right= 70 degrees.    Time  6    Period  Weeks    Status  On-going      PT LONG TERM GOAL #3   Title  Perform work related activites with pain not > 2-3/10.    Baseline  Pain rises to 6 or greater wiht work-related activites.    Time  6    Period  Weeks    Status  On-going            Plan - 02/15/18 6438    Clinical Impression Statement  E-stim and moist heat performed only per patient request. PT asked if she would like light STW/M to decrease pain however patient stated she was still sore from last visit. Patient was able to  tolerate e-stim and MHP was noted with normal response to modalities at the end of the session.     Clinical Presentation  Stable    Clinical Decision Making  Low    Rehab Potential  Excellent    PT Frequency  2x / week    PT Duration  6 weeks    PT Treatment/Interventions  ADLs/Self Care Home Management;Cryotherapy;Electrical Stimulation;Ultrasound;Traction;Therapeutic activities;Therapeutic exercise;Passive range of motion;Patient/family education;Manual techniques;Dry needling    PT Next Visit Plan  Progress cervical and postural strengthening with modalities and STW PRN per MPT POC.    Consulted and Agree with Plan of Care  Patient       Patient will benefit from skilled therapeutic intervention in order to improve the following deficits and impairments:  Decreased activity tolerance, Postural dysfunction, Pain, Decreased range of motion  Visit Diagnosis: Cervicalgia  Abnormal posture     Problem List Patient Active Problem List   Diagnosis Date Noted  . Hyperlipidemia LDL goal <130 05/01/2017  . Current smoker 03/07/2017  . Obesity (BMI 30.0-34.9) 02/02/2017  . Hypothyroid 02/02/2017  . GERD (gastroesophageal reflux disease) 02/02/2017  . Migraine 02/02/2017  . Bipolar 1 disorder, depressed ([place]) 02/02/2017   Gabriela Eves, PT, DPT 02/15/2018, 12:09 PM  [facility] [place], [place], [address] Phone: [phone]   Fax:  [phone]  Name: Pam Chen MRN: [id number] Date of Birth: 1971/02/19

## 2018-02-18 ENCOUNTER — Encounter: Payer: Medicaid Other | Admitting: Physical Therapy

## 2018-02-20 ENCOUNTER — Encounter: Payer: Medicaid Other | Admitting: Physical Therapy

## 2018-03-04 ENCOUNTER — Ambulatory Visit: Payer: Medicaid Other | Admitting: Nurse Practitioner

## 2018-03-04 ENCOUNTER — Encounter: Payer: Self-pay | Admitting: Nurse Practitioner

## 2018-03-04 VITALS — BP 130/90 | HR 84 | Temp 97.0°F | Ht 67.0 in | Wt 208.0 lb

## 2018-03-04 DIAGNOSIS — M7712 Lateral epicondylitis, left elbow: Secondary | ICD-10-CM

## 2018-03-04 DIAGNOSIS — M7711 Lateral epicondylitis, right elbow: Secondary | ICD-10-CM | POA: Diagnosis not present

## 2018-03-04 DIAGNOSIS — Z23 Encounter for immunization: Secondary | ICD-10-CM | POA: Diagnosis not present

## 2018-03-04 MED ORDER — PREDNISONE 10 MG (21) PO TBPK: ORAL_TABLET | ORAL | 0 refills | Status: DC

## 2018-03-04 NOTE — Progress Notes (Signed)
   Subjective:    Patient ID: Pam Chen, female    DOB: 30-Nov-1970, 47 y.o.   MRN: [id number]   Chief Complaint: Bilateral elbow pain (would like injections)   HPI Patient comes in c/o of bil elbow pain. She works at [employer] and does repetitve work. Pain has increased since last Friday. She has been wear tennis elbow straps which helps some. When sheis work it is all she can do to get through working. She has had injections in left elbow un the past.    Review of Systems  Respiratory: Negative.   Cardiovascular: Negative.   Neurological: Negative.   Psychiatric/Behavioral: Negative.   All other systems reviewed and are negative.      Objective:   Physical Exam  Constitutional: She is oriented to person, place, and time. She appears well-developed and well-nourished.  Cardiovascular: Normal rate.  Pulmonary/Chest: Effort normal.  Musculoskeletal:  Pain on palpation bil elbow along lateral epicondyle. Grips equal bil  Neurological: She is alert and oriented to person, place, and time.  Skin: Skin is warm.  Psychiatric: She has a normal mood and affect. Her behavior is normal. Judgment and thought content normal.   BP 130/90   Pulse 84   Temp (!) 97 F (36.1 C) (Oral)   Ht 5\' 7"  (1.702 m)   Wt 208 lb (94.3 kg)   BMI 32.58 kg/m       Assessment & Plan:  Natarsha Hurwitz in today with chief complaint of Bilateral elbow pain (would like injections)   1. Bilateral tennis elbow Rest Wear elbow straps constantly RTO PRN - predniSONE (STERAPRED UNI-PAK 21 TAB) 10 MG (21) TBPK tablet; As directed x 6 days  Dispense: 21 tablet; Refill: 0  [name], FNP

## 2018-03-04 NOTE — Patient Instructions (Signed)
Tennis Elbow Tennis elbow is puffiness (inflammation) of the outer tendons of your forearm close to your elbow. Your tendons attach your muscles to your bones. Tennis elbow can happen in any sport or job in which you use your elbow too much. It is caused by doing the same motion over and over. Tennis elbow can cause:  Pain and tenderness in your forearm and the outer part of your elbow.  A burning feeling. This runs from your elbow through your arm.  Weak grip in your hands.  Follow these instructions at home: Activity  Rest your elbow and wrist as told by your doctor. Try to avoid any activities that caused the problem until your doctor says that you can do them again.  If a physical therapist teaches you exercises, do all of them as told.  If you lift an object, lift it with your palm facing up. This is easier on your elbow. Lifestyle  If your tennis elbow is caused by sports, check your equipment and make sure that: ? You are using it correctly. ? It fits you well.  If your tennis elbow is caused by work, take breaks often, if you are able. Talk with your manager about doing your work in a way that is safe for you. ? If your tennis elbow is caused by computer use, talk with your manager about any changes that can be made to your work setup. General instructions  If told, apply ice to the painful area: ? Put ice in a plastic bag. ? Place a towel between your skin and the bag. ? Leave the ice on for 20 minutes, 2-3 times per day.  Take medicines only as told by your doctor.  If you were given a brace, wear it as told by your doctor.  Keep all follow-up visits as told by your doctor. This is important. Contact a doctor if:  Your pain does not get better with treatment.  Your pain gets worse.  You have weakness in your forearm, hand, or fingers.  You cannot feel your forearm, hand, or fingers. This information is not intended to replace advice given to you by your health  care provider. Make sure you discuss any questions you have with your health care provider. Document Released: 11/02/2009 Document Revised: 01/13/2016 Document Reviewed: 05/11/2014 Elsevier Interactive Patient Education  2018 Elsevier Inc.  

## 2018-03-29 ENCOUNTER — Encounter: Payer: Self-pay | Admitting: Pediatrics

## 2018-03-29 ENCOUNTER — Ambulatory Visit: Payer: Medicaid Other | Admitting: Pediatrics

## 2018-03-29 VITALS — BP 129/75 | HR 83 | Temp 97.8°F | Ht 67.0 in | Wt 206.0 lb

## 2018-03-29 DIAGNOSIS — K5900 Constipation, unspecified: Secondary | ICD-10-CM | POA: Diagnosis not present

## 2018-03-29 DIAGNOSIS — F339 Major depressive disorder, recurrent, unspecified: Secondary | ICD-10-CM | POA: Diagnosis not present

## 2018-03-29 DIAGNOSIS — F1910 Other psychoactive substance abuse, uncomplicated: Secondary | ICD-10-CM | POA: Diagnosis not present

## 2018-03-29 MED ORDER — LACTULOSE 20 GM/30ML PO SOLN: 20.0000 g | Freq: Three times a day (TID) | ORAL | 1 refills | Status: DC | PRN

## 2018-03-29 MED ORDER — LINACLOTIDE 72 MCG PO CAPS: 72.0000 ug | ORAL_CAPSULE | Freq: Every day | ORAL | 0 refills | Status: DC
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
## 2018-04-01 ENCOUNTER — Telehealth: Payer: Self-pay | Admitting: *Deleted

## 2018-04-01 ENCOUNTER — Ambulatory Visit: Payer: Medicaid Other | Admitting: Family Medicine

## 2018-04-01 ENCOUNTER — Encounter: Payer: Self-pay | Admitting: Family Medicine

## 2018-04-01 VITALS — BP 132/88 | HR 80 | Temp 98.9°F | Ht 67.0 in | Wt 207.0 lb

## 2018-04-01 DIAGNOSIS — J029 Acute pharyngitis, unspecified: Secondary | ICD-10-CM | POA: Diagnosis not present

## 2018-04-01 DIAGNOSIS — J039 Acute tonsillitis, unspecified: Secondary | ICD-10-CM | POA: Diagnosis not present

## 2018-04-01 LAB — CULTURE, GROUP A STREP

## 2018-04-01 LAB — RAPID STREP SCREEN (MED CTR MEBANE ONLY): STREP GP A AG, IA W/REFLEX: NEGATIVE

## 2018-04-01 MED ORDER — AMOXICILLIN-POT CLAVULANATE 875-125 MG PO TABS: 1.0000 | ORAL_TABLET | Freq: Two times a day (BID) | ORAL | 0 refills | Status: DC

## 2018-04-01 NOTE — Telephone Encounter (Signed)
Requesting to speak with Helen. Please call back.  

## 2018-04-01 NOTE — Progress Notes (Signed)
Chief Complaint  Patient presents with  . Sore Throat    x 1 wk - getting worse    HPI  Patient presents today for increasing sore throat over the last week. Has frequent strep. This is just like it except no fever this time and she usually has fever with strep. Denies cough. Glands hurt at hroat.  PMH: Smoking status noted ROS: Per HPI  Objective: BP 132/88 (BP Location: Left Arm)   Pulse 80   Temp 98.9 F (37.2 C) (Oral)   Ht 5\' 7"  (1.702 m)   Wt 207 lb (93.9 kg)   BMI 32.42 kg/m  Gen: NAD, alert, cooperative. HEENT: NCAT, EOMI, PERRL. Pharynx red,  tonsils 2+ swollen, red with small exudate.Ant cervical nodes 2-3+ enlarged with tenderness as well. CV: RRR, good S1/S2, no murmur Resp: CTABL, no wheezes, non-labored Ext: No edema, warm Neuro: Alert and oriented, No gross deficits  Assessment and plan:  1. Tonsillitis   2. Sore throat     No orders of the defined types were placed in this encounter.   Orders Placed This Encounter  Procedures  . Rapid Strep Screen ([facility] ONLY)    Follow up as needed.  Mechele Claude, MD

## 2018-04-01 NOTE — Telephone Encounter (Signed)
Pt was referred by her pcp Been using pills most of her life Her choices at present are: Cocaine Xanax Valium Weed She states she has never used meth, heroin, fentanyl, other street drugs She is tired and ready to get clean, she had found a job she enjoyed and lost it due to random drug screen No one in her home uses Her family is supportive, she informed all of them last week and they will help her anyway they can 2018 suicide attempt with pills Cell# [phone] Home# [phone] If you agree, may I add her to tomorrow's schedule?

## 2018-04-01 NOTE — Telephone Encounter (Signed)
Called pt back gave her some resources ADS and other ph# of treatment for benzo addiction

## 2018-04-01 NOTE — Telephone Encounter (Signed)
Agree.  Would not be appropriate for our clinic.

## 2018-04-01 NOTE — Telephone Encounter (Signed)
It doesn't seem like she is using opioids or has an opioid use disorder? We don't have comprehensive services for benzo use disorder or cocaine use disorder.

## 2018-04-12 ENCOUNTER — Encounter: Payer: Self-pay | Admitting: Pediatrics

## 2018-04-12 ENCOUNTER — Ambulatory Visit: Payer: Medicaid Other | Admitting: Pediatrics

## 2018-04-12 VITALS — BP 128/76 | HR 68 | Temp 97.9°F | Ht 67.0 in | Wt 209.4 lb

## 2018-04-12 DIAGNOSIS — F1910 Other psychoactive substance abuse, uncomplicated: Secondary | ICD-10-CM

## 2018-04-12 DIAGNOSIS — F339 Major depressive disorder, recurrent, unspecified: Secondary | ICD-10-CM

## 2018-04-12 DIAGNOSIS — K5900 Constipation, unspecified: Secondary | ICD-10-CM

## 2018-04-12 MED ORDER — LINACLOTIDE 145 MCG PO CAPS: 145.0000 ug | ORAL_CAPSULE | Freq: Every day | ORAL | 1 refills | Status: DC

## 2018-04-12 NOTE — Progress Notes (Signed)
  Subjective:   Patient ID: Pam Chen, female    DOB: 12/21/1970, 47 y.o.   MRN: [id number] CC: Medical Management of Chronic Issues  HPI: Pam Chen is a 47 y.o. female   Was able to establish care with Pam Chen counseling.  Has been avoiding drug use for the last 3 weeks.  Most recently doing cocaine.  Has been weaning off of Valium, prescribed by psychiatrist.  In the past, about a year ago, was regularly using oxycodone and hydrocodone.  She is meeting with substance abuse physician next week to discuss possible medication treatment.  She feels well supported by both psychiatrist and counselor, feels like she can reach out to them at any point if things are getting worse. She is avoiding people and places that she was previously hanging out with.  Ongoing trouble with constipation.  Feels safe at home, mood has been ok.  Relevant past medical, surgical, family and social history reviewed. Allergies and medications reviewed and updated. Social History   Tobacco Use  Smoking Status Current Every Day Smoker  . Packs/day: 0.30  . Types: Cigarettes  Smokeless Tobacco Never Used  Tobacco Comment   down to 3-4 cigarettes per day - trying to stop   ROS: Per HPI   Objective:    BP 128/76   Pulse 68   Temp 97.9 F (36.6 C) (Oral)   Ht 5\' 7"  (1.702 m)   Wt 209 lb 6.4 oz (95 kg)   BMI 32.80 kg/m   Wt Readings from Last 3 Encounters:  04/12/18 209 lb 6.4 oz (95 kg)  04/01/18 207 lb (93.9 kg)  03/29/18 206 lb (93.4 kg)    Gen: NAD, alert, cooperative with exam, NCAT EYES: EOMI, no conjunctival injection, or no icterus ENT:  TMs pearly gray b/l, OP without erythema LYMPH: no cervical LAD CV: NRRR, normal S1/S2, no murmur, distal pulses 2+ b/l Resp: CTABL, no wheezes, normal WOB Abd: +BS, soft, NTND. Ext: No edema, warm Neuro: Alert and oriented, strength equal b/l UE and LE, coordination grossly normal  Assessment & Plan:  Pam Chen was seen today for medical management of  chronic issues.  Diagnoses and all orders for this visit:  Substance abuse (HCC) Recently stopped.  Would like to remain off of drugs.  Seeing substance abuse counselor regularly.  Feels well supported.  Constipation, unspecified constipation type Trial below. -     linaclotide (LINZESS) 145 MCG CAPS capsule; Take 1 capsule (145 mcg total) by mouth daily before breakfast.  Depression, recurrent (HCC) Continue following with psychiatry.  No thoughts of self-harm.  Follow up plan: Return in about 4 weeks (around 05/10/2018). Rex Krasarol , MD [facility]

## 2018-04-24 ENCOUNTER — Other Ambulatory Visit: Payer: Self-pay | Admitting: Family Medicine

## 2018-04-24 MED ORDER — FUROSEMIDE 20 MG PO TABS: 20.0000 mg | ORAL_TABLET | Freq: Every day | ORAL | 0 refills | Status: DC

## 2018-04-24 NOTE — Telephone Encounter (Signed)
Go ahead and send a small prescription of Lasix 20 mg daily as needed and give her 30 with no refills

## 2018-04-24 NOTE — Telephone Encounter (Signed)
Seen on 11-15 for office visit.  Please advise on medication or must be seen for swelling?

## 2018-04-24 NOTE — Telephone Encounter (Signed)
Pt aware and rx sent over to pharmacy.

## 2018-05-03 ENCOUNTER — Other Ambulatory Visit: Payer: Self-pay | Admitting: Family Medicine

## 2018-05-10 ENCOUNTER — Ambulatory Visit: Payer: Medicaid Other | Admitting: Pediatrics

## 2018-05-13 ENCOUNTER — Other Ambulatory Visit: Payer: Self-pay | Admitting: Family Medicine

## 2018-05-14 ENCOUNTER — Encounter: Payer: Self-pay | Admitting: Pediatrics

## 2018-05-14 ENCOUNTER — Ambulatory Visit: Payer: Medicaid Other | Admitting: Pediatrics

## 2018-05-14 VITALS — BP 124/79 | HR 78 | Temp 97.8°F | Ht 67.0 in | Wt 221.4 lb

## 2018-05-14 DIAGNOSIS — F17200 Nicotine dependence, unspecified, uncomplicated: Secondary | ICD-10-CM

## 2018-05-14 DIAGNOSIS — K5900 Constipation, unspecified: Secondary | ICD-10-CM

## 2018-05-14 MED ORDER — LINACLOTIDE 290 MCG PO CAPS: 290.0000 ug | ORAL_CAPSULE | Freq: Every day | ORAL | 1 refills | Status: DC

## 2018-05-14 NOTE — Progress Notes (Signed)
  Subjective:   Patient ID: Pam Chen, female    DOB: 03/05/1971, 47 y.o.   MRN: [id number] CC: Follow-up Multiple medical problems HPI: Pam BeadyJodie Eastep is a 47 y.o. female   Has established with group therapy to the [facility].  On Suboxone through them for now, has also gotten from Dr. Tollie EthPlummer [facility] but now that she is in the group therapy is getting it from that office for now.  Has been pleased with how things have been going.  Remains drug-free.  Continues following with her psychiatrist.  Mood has been doing well.  No thoughts of self-harm.  Constipation still bothering her.  Linzess 145 mcg helped some, ran out of the sample. 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  Trying to eat plenty of fruits and vegetables.  Relevant past medical, surgical, family and social history reviewed. Allergies and medications reviewed and updated. Social History   Tobacco Use  Smoking Status Current Every Day Smoker  . Packs/day: 0.30  . Types: Cigarettes  Smokeless Tobacco Never Used  Tobacco Comment   down to 3-4 cigarettes per day - trying to stop   ROS: Per HPI   Objective:    BP 124/79   Pulse 78   Temp 97.8 F (36.6 C) (Oral)   Ht 5\' 7"  (1.702 m)   Wt 221 lb 6.4 oz (100.4 kg)   BMI 34.68 kg/m   Wt Readings from Last 3 Encounters:  05/14/18 221 lb 6.4 oz (100.4 kg)  04/12/18 209 lb 6.4 oz (95 kg)  04/01/18 207 lb (93.9 kg)    Gen: NAD, alert, cooperative with exam, NCAT EYES: EOMI, no conjunctival injection, or no icterus ENT: OP without erythema LYMPH: no cervical LAD CV: NRRR, normal S1/S2, no murmur, distal pulses 2+ b/l Resp: CTABL, no wheezes, normal WOB Ext: No edema, warm Neuro: Alert and oriented MSK: normal muscle bulk  Assessment & Plan:  [name] was seen today for follow-up multiple medical problems.  Diagnoses and all orders for this visit:  Constipation, unspecified constipation type Ongoing symptoms, increased dose of  Linzess.  Take daily in the morning before breakfast. -     linaclotide (LINZESS) 290 MCG CAPS capsule; Take 1 capsule (290 mcg total) by mouth daily before breakfast.  Current smoker Continue to encourage cessation.  History of substance use Good support through group therapy, psychiatry. Continue follow-up with Suboxone clinic, group therapy, psychiatry.   Follow up plan: Return in about 8 weeks (around 07/09/2018). Rex Krasarol Vincent, MD [facility]

## 2018-06-27 ENCOUNTER — Other Ambulatory Visit: Payer: Self-pay | Admitting: Physician Assistant

## 2018-06-27 MED ORDER — OSELTAMIVIR PHOSPHATE 75 MG PO CAPS: 75.0000 mg | ORAL_CAPSULE | Freq: Two times a day (BID) | ORAL | 0 refills | Status: DC

## 2018-06-28 ENCOUNTER — Telehealth: Payer: Self-pay | Admitting: Pediatrics

## 2018-06-28 NOTE — Telephone Encounter (Signed)
Pt notified note is ready for pick up Note to front

## 2018-06-28 NOTE — Telephone Encounter (Signed)
OK to provide note

## 2018-07-05 ENCOUNTER — Other Ambulatory Visit: Payer: Self-pay | Admitting: Family Medicine

## 2018-07-08 ENCOUNTER — Other Ambulatory Visit: Payer: Self-pay | Admitting: Family

## 2018-07-08 DIAGNOSIS — E039 Hypothyroidism, unspecified: Secondary | ICD-10-CM

## 2018-07-19 ENCOUNTER — Ambulatory Visit: Payer: Medicaid Other | Admitting: Pediatrics

## 2018-07-22 ENCOUNTER — Encounter: Payer: Self-pay | Admitting: Family Medicine

## 2018-07-22 ENCOUNTER — Ambulatory Visit: Payer: Medicaid Other | Admitting: Family Medicine

## 2018-07-22 VITALS — BP 112/72 | HR 62 | Temp 97.5°F | Ht 67.0 in | Wt 225.4 lb

## 2018-07-22 DIAGNOSIS — E669 Obesity, unspecified: Secondary | ICD-10-CM

## 2018-07-22 DIAGNOSIS — F419 Anxiety disorder, unspecified: Secondary | ICD-10-CM

## 2018-07-22 DIAGNOSIS — E785 Hyperlipidemia, unspecified: Secondary | ICD-10-CM | POA: Diagnosis not present

## 2018-07-22 DIAGNOSIS — K219 Gastro-esophageal reflux disease without esophagitis: Secondary | ICD-10-CM | POA: Diagnosis not present

## 2018-07-22 DIAGNOSIS — E66811 Obesity, class 1: Secondary | ICD-10-CM

## 2018-07-22 DIAGNOSIS — R5383 Other fatigue: Secondary | ICD-10-CM

## 2018-07-22 DIAGNOSIS — E039 Hypothyroidism, unspecified: Secondary | ICD-10-CM

## 2018-07-22 MED ORDER — FLUOXETINE HCL 40 MG PO CAPS: 40.0000 mg | ORAL_CAPSULE | Freq: Every day | ORAL | 3 refills | Status: DC

## 2018-07-22 NOTE — Progress Notes (Signed)
BP 112/72   Pulse 62   Temp (!) 97.5 F (36.4 C) (Oral)   Ht '5\' 7"'  (1.702 m)   Wt 225 lb 6.4 oz (102.2 kg)   BMI 35.30 kg/m    Subjective:    Patient ID: Pam Chen, female    DOB: 1971/01/10, 48 y.o.   MRN: [id number]  HPI: Pam Chen is a 48 y.o. female presenting on 07/22/2018 for Hyperlipidemia (2 month follow up- Patient states that she has been 4 months sobor. ); Hypothyroidism; and Fatigue (x 1 month )   HPI Hypothyroidism recheck Patient is coming in for thyroid recheck today as well. They deny any issues with hair changes or heat or cold problems or diarrhea or constipation. They deny any chest pain or palpitations. They are currently on levothyroxine 36mcrograms   Hyperlipidemia Patient is coming in for recheck of his hyperlipidemia. The patient is currently taking no medication currently and monitoring, will check labs today. They deny any issues with myalgias or history of liver damage from it. They deny any focal numbness or weakness or chest pain.   GERD Patient is currently on no medication currently and she is having some acid reflux, she has omeprazole but has not been using it consistently and will start using it consistently..  She denies any major symptoms or abdominal pain or belching or burping. She denies any blood in her stool or lightheadedness or dizziness.   Anxiety and depression and addiction recovery Patient is coming in for anxiety depression and addiction recovery.  She has been in a Suboxone clinic and addiction recovery clinic and has been taking Prozac 20 mg to help with her mood and anxiety and says it is helping but she is not exactly where she like to be and she would like to try increase the Prozac.  She says she is going to alcoholics anonymous and Narcotics Anonymous and doing well with those things as well. [facility] 07/22[facility]2018 05/14/2018 04/12/2018 04/01/2018 03/29/2018  Decreased Interest 0 '1 2 2 2  ' Down, Depressed, Hopeless  0 '1 2 2 2  ' PHQ - 2 Score 0 '2 4 4 4  ' Altered sleeping '2 3 2 2 3  ' Tired, decreased energy '2 2 1 1 2  ' Change in appetite '2 2 2 2 2  ' Feeling bad or failure about yourself  0 '1 3 3 3  ' Trouble concentrating 0 '1 1 1 ' -  Moving slowly or fidgety/restless 0 0 '1 1 1  ' Suicidal thoughts 0 0 '1 1 2  ' PHQ-9 Score '6 11 15 15 17  ' Difficult doing work/chores - Somewhat difficult Somewhat difficult Somewhat difficult -  Some recent data might be hidden     Relevant past medical, surgical, family and social history reviewed and updated as indicated. Interim medical history since our last visit reviewed. Allergies and medications reviewed and updated.  Review of Systems  Constitutional: Positive for fatigue. Negative for chills and fever.  HENT: Negative for congestion, ear discharge and ear pain.   Eyes: Negative for redness and visual disturbance.  Respiratory: Negative for chest tightness and shortness of breath.   Cardiovascular: Negative for chest pain and leg swelling.  Genitourinary: Negative for difficulty urinating and dysuria.  Musculoskeletal: Negative for back pain and gait problem.  Skin: Negative for rash.  Neurological: Negative for light-headedness and headaches.  Psychiatric/Behavioral: Positive for dysphoric mood. Negative for agitation, behavioral problems, self-injury, sleep disturbance and suicidal ideas. The patient is nervous/anxious.   All other  systems reviewed and are negative.   Per HPI unless specifically indicated above   Allergies as of 07/22/2018      Reactions   Aspirin Other (See Comments)   Upset tummy.  Can take ibuprofen.   Oxycodone-acetaminophen Anxiety   Can take vicodin   Tramadol Hcl Nausea Only      Medication List       Accurate as of July 22, 2018  2:01 PM. Always use your most recent med list.        Buprenorphine HCl-Naloxone HCl 8-2 MG Film Place 8 mg under the tongue 2 (two) times daily.   FLUoxetine 40 MG capsule Commonly known as:   PROZAC Take 1 capsule (40 mg total) by mouth daily.   furosemide 20 MG tablet Commonly known as:  LASIX Take 1 tablet (20 mg total) by mouth daily.   Galcanezumab-gnlm 120 MG/ML Soaj INJECT 2ML (240MG) FOR FIRST DOSE. THEN INECT 1ML (120MG) ONCE EVERY 30 DAYS   HM VITAMIN D3 100 MCG (4000 UT) Caps Generic drug:  Cholecalciferol Take 4,000 Units by mouth daily.   Lactulose 20 GM/30ML Soln Take 30 mLs (20 g total) by mouth 3 (three) times daily as needed.   levothyroxine 75 MCG tablet Commonly known as:  SYNTHROID, LEVOTHROID TAKE 1 TABLET BY MOUTH EVERY DAY   linaclotide 290 MCG Caps capsule Commonly known as:  LINZESS Take 1 capsule (290 mcg total) by mouth daily before breakfast.   multivitamin with minerals Tabs tablet Take 1 tablet by mouth daily.   ondansetron 4 MG disintegrating tablet Commonly known as:  ZOFRAN ODT Take 1 tablet (4 mg total) by mouth every 8 (eight) hours as needed for nausea or vomiting.   propranolol ER 120 MG 24 hr capsule Commonly known as:  INDERAL LA TAKE 1 CAPSULE BY MOUTH EVERY DAY   rizatriptan 10 MG tablet Commonly known as:  MAXALT TAKE 1 TABLET (10 MG TOTAL) BY MOUTH AS NEEDED FOR MIGRAINE. MAY REPEAT IN 2 HOURS IF NEEDED   solifenacin 5 MG tablet Commonly known as:  VESICARE Take 1 tablet (5 mg total) by mouth daily.   topiramate 100 MG tablet Commonly known as:  TOPAMAX Take 1 tablet (100 mg total) by mouth 2 (two) times daily.          Objective:    BP 112/72   Pulse 62   Temp (!) 97.5 F (36.4 C) (Oral)   Ht '5\' 7"'  (1.702 m)   Wt 225 lb 6.4 oz (102.2 kg)   BMI 35.30 kg/m   Wt Readings from Last 3 Encounters:  07/22/18 225 lb 6.4 oz (102.2 kg)  05/14/18 221 lb 6.4 oz (100.4 kg)  04/12/18 209 lb 6.4 oz (95 kg)    Physical Exam Vitals signs and nursing note reviewed.  Constitutional:      General: She is not in acute distress.    Appearance: She is well-developed. She is not diaphoretic.  Eyes:      Conjunctiva/sclera: Conjunctivae normal.  Cardiovascular:     Rate and Rhythm: Normal rate and regular rhythm.     Heart sounds: Normal heart sounds. No murmur.  Pulmonary:     Effort: Pulmonary effort is normal. No respiratory distress.     Breath sounds: Normal breath sounds. No wheezing.  Musculoskeletal: Normal range of motion.        General: No tenderness.  Skin:    General: Skin is warm and dry.     Findings: No rash.  Neurological:     Mental Status: She is alert and oriented to person, place, and time.     Coordination: Coordination normal.  Psychiatric:        Behavior: Behavior normal.       Assessment & Plan:   Problem List Items Addressed This Visit      Digestive   GERD (gastroesophageal reflux disease)   Relevant Orders   CBC with Differential/Platelet   CMP14+EGFR     Endocrine   Hypothyroid - Primary   Relevant Orders   TSH     Other   Obesity (BMI 30.0-34.9)   Relevant Orders   Lipid panel   Hyperlipidemia LDL goal <130   Relevant Orders   Lipid panel    Other Visit Diagnoses    Other fatigue       Relevant Orders   CBC with Differential/Platelet   CMP14+EGFR   TSH   Anxiety       Relevant Medications   FLUoxetine (PROZAC) 40 MG capsule      Patient has been on Prozac 20 to help with addiction recovery and anxiety depression she would like to increase it to 40 and get it from here. Follow up plan: Return in about 3 months (around 10/20/2018), or if symptoms worsen or fail to improve, for Anxiety and thyroid and fatigue recheck.  Counseling provided for all of the vaccine components Orders Placed This Encounter  Procedures  . CBC with Differential/Platelet  . CMP14+EGFR  . TSH  . Lipid panel    Caryl Pina, MD [facility] 07/22/2018, 2:01 PM

## 2018-07-23 LAB — LIPID PANEL
Chol/HDL Ratio: 4.4 ratio (ref 0.0–4.4)
Cholesterol, Total: 199 mg/dL (ref 100–199)
HDL: 45 mg/dL (ref >39)
LDL Calculated: 105 mg/dL — ABNORMAL HIGH (ref 0–99)
Triglycerides: 245 mg/dL — ABNORMAL HIGH (ref 0–149)
VLDL CHOLESTEROL CAL: 49 mg/dL — AB (ref 5–40)

## 2018-07-23 LAB — CBC WITH DIFFERENTIAL/PLATELET
BASOS ABS: 0 10*3/uL (ref 0.0–0.2)
Basos: 1 %
EOS (ABSOLUTE): 0.1 10*3/uL (ref 0.0–0.4)
Eos: 1 %
Hematocrit: 41.8 % (ref 34.0–46.6)
Hemoglobin: 14.6 g/dL (ref 11.1–15.9)
Immature Grans (Abs): 0 10*3/uL (ref 0.0–0.1)
Immature Granulocytes: 0 %
LYMPHS ABS: 2.4 10*3/uL (ref 0.7–3.1)
Lymphs: 27 %
MCH: 30.7 pg (ref 26.6–33.0)
MCHC: 34.9 g/dL (ref 31.5–35.7)
MCV: 88 fL (ref 79–97)
MONOCYTES: 8 %
Monocytes Absolute: 0.7 10*3/uL (ref 0.1–0.9)
Neutrophils Absolute: 5.6 10*3/uL (ref 1.4–7.0)
Neutrophils: 63 %
Platelets: 225 10*3/uL (ref 150–450)
RBC: 4.76 x10E6/uL (ref 3.77–5.28)
RDW: 12.5 % (ref 11.7–15.4)
WBC: 8.9 10*3/uL (ref 3.4–10.8)

## 2018-07-23 LAB — CMP14+EGFR
ALK PHOS: 79 IU/L (ref 39–117)
ALT: 12 IU/L (ref 0–32)
AST: 12 IU/L (ref 0–40)
Albumin/Globulin Ratio: 1.6 (ref 1.2–2.2)
Albumin: 3.9 g/dL (ref 3.8–4.8)
BUN/Creatinine Ratio: 11 (ref 9–23)
BUN: 8 mg/dL (ref 6–24)
CHLORIDE: 102 mmol/L (ref 96–106)
CO2: 23 mmol/L (ref 20–29)
Calcium: 9.5 mg/dL (ref 8.7–10.2)
Creatinine, Ser: 0.75 mg/dL (ref 0.57–1.00)
GFR calc Af Amer: 111 mL/min/{1.73_m2} (ref >59)
GFR calc non Af Amer: 97 mL/min/{1.73_m2} (ref >59)
Globulin, Total: 2.5 g/dL (ref 1.5–4.5)
Glucose: 94 mg/dL (ref 65–99)
Potassium: 4.1 mmol/L (ref 3.5–5.2)
Sodium: 138 mmol/L (ref 134–144)
Total Protein: 6.4 g/dL (ref 6.0–8.5)

## 2018-07-23 LAB — TSH: TSH: 1.96 u[IU]/mL (ref 0.450–4.500)

## 2018-08-02 ENCOUNTER — Encounter (HOSPITAL_COMMUNITY): Payer: Self-pay | Admitting: *Deleted

## 2018-08-02 ENCOUNTER — Other Ambulatory Visit: Payer: Self-pay

## 2018-08-02 ENCOUNTER — Emergency Department (HOSPITAL_COMMUNITY)
Admission: EM | Admit: 2018-08-02 | Discharge: 2018-08-02 | Disposition: A | Discharge status: Home / Self Care | Payer: Medicaid Other | Attending: Emergency Medicine | Admitting: Emergency Medicine

## 2018-08-02 ENCOUNTER — Emergency Department (HOSPITAL_COMMUNITY): Payer: Medicaid Other

## 2018-08-02 ENCOUNTER — Telehealth: Payer: Self-pay | Admitting: Family Medicine

## 2018-08-02 DIAGNOSIS — M549 Dorsalgia, unspecified: Secondary | ICD-10-CM | POA: Diagnosis not present

## 2018-08-02 DIAGNOSIS — Z5321 Procedure and treatment not carried out due to patient leaving prior to being seen by health care provider: Secondary | ICD-10-CM | POA: Insufficient documentation

## 2018-08-02 DIAGNOSIS — R0789 Other chest pain: Secondary | ICD-10-CM | POA: Insufficient documentation

## 2018-08-02 DIAGNOSIS — M542 Cervicalgia: Secondary | ICD-10-CM | POA: Diagnosis not present

## 2018-08-02 DIAGNOSIS — M79602 Pain in left arm: Secondary | ICD-10-CM | POA: Diagnosis not present

## 2018-08-02 HISTORY — DX: Essential (primary) hypertension: I10

## 2018-08-02 LAB — CBC
HCT: 47 % — ABNORMAL HIGH (ref 36.0–46.0)
Hemoglobin: 14.9 g/dL (ref 12.0–15.0)
MCH: 28.5 pg (ref 26.0–34.0)
MCHC: 31.7 g/dL (ref 30.0–36.0)
MCV: 90 fL (ref 80.0–100.0)
Platelets: 232 10*3/uL (ref 150–400)
RBC: 5.22 MIL/uL — ABNORMAL HIGH (ref 3.87–5.11)
RDW: 12.7 % (ref 11.5–15.5)
WBC: 9.1 10*3/uL (ref 4.0–10.5)
nRBC: 0 % (ref 0.0–0.2)

## 2018-08-02 LAB — TROPONIN I: Troponin I: <0.03 ng/mL (ref <0.03)

## 2018-08-02 LAB — BASIC METABOLIC PANEL
Anion gap: 6 (ref 5–15)
BUN: 12 mg/dL (ref 6–20)
CO2: 24 mmol/L (ref 22–32)
Calcium: 9.1 mg/dL (ref 8.9–10.3)
Chloride: 107 mmol/L (ref 98–111)
Creatinine, Ser: 0.88 mg/dL (ref 0.44–1.00)
GFR calc Af Amer: >60 mL/min (ref >60)
GFR calc non Af Amer: >60 mL/min (ref >60)
GLUCOSE: 90 mg/dL (ref 70–99)
Potassium: 4.4 mmol/L (ref 3.5–5.1)
Sodium: 137 mmol/L (ref 135–145)

## 2018-08-02 NOTE — ED Triage Notes (Signed)
Pt c/o left sided chest pain that radiates to left arm, back and neck. Pt also reports nausea and pain with taking a deep breath.

## 2018-08-02 NOTE — Telephone Encounter (Signed)
*  FYI* Pt called stating she was having chest pain that radiated down left arm. Pt was advised to call 911 or go straight to the ED. Pt verbalized understanding and stated she was on the way.

## 2018-08-03 ENCOUNTER — Other Ambulatory Visit: Payer: Self-pay | Admitting: Family Medicine

## 2018-08-04 ENCOUNTER — Other Ambulatory Visit: Payer: Self-pay | Admitting: Family

## 2018-08-04 DIAGNOSIS — E039 Hypothyroidism, unspecified: Secondary | ICD-10-CM

## 2018-08-08 ENCOUNTER — Telehealth: Payer: Self-pay | Admitting: Family Medicine

## 2018-08-08 MED ORDER — RIZATRIPTAN BENZOATE 10 MG PO TABS: ORAL_TABLET | ORAL | 2 refills | Status: DC

## 2018-08-08 NOTE — Telephone Encounter (Signed)
Pt aware and will no longer use Dr Terrace Arabia after this refill

## 2018-08-08 NOTE — Telephone Encounter (Signed)
Requesting medicine for a migraine.

## 2018-08-08 NOTE — Telephone Encounter (Signed)
Sent in refill for patient of her triptan.

## 2018-08-09 ENCOUNTER — Ambulatory Visit: Payer: Medicaid Other | Admitting: Family Medicine

## 2018-08-22 ENCOUNTER — Telehealth (INDEPENDENT_AMBULATORY_CARE_PROVIDER_SITE_OTHER): Payer: Medicaid Other | Admitting: Family Medicine

## 2018-08-22 ENCOUNTER — Other Ambulatory Visit: Payer: Self-pay

## 2018-08-22 DIAGNOSIS — F419 Anxiety disorder, unspecified: Secondary | ICD-10-CM | POA: Diagnosis not present

## 2018-08-22 NOTE — Progress Notes (Signed)
Virtual Visit via telephone Note  I connected with Pam Chen on 08/22/18 at 1355 by telephone and verified that I am speaking with the correct person using two identifiers. Pam Chen is currently located at home and No other people are currently with her during visit. The provider, [name] , MD is located in their office at time of visit.  Call ended at 1407  I discussed the limitations, risks, security and privacy concerns of performing an evaluation and management service by telephone and the availability of in person appointments. I also discussed with the patient that there may be a patient responsible charge related to this service. The patient expressed understanding and agreed to proceed.   History and Present Illness: Anxiety and depression Fluoxetine has been doing well and feels like energy has been doing well except sleep. Patient denies any thought of suicide or thoughts of hurting herself.  She feels like mood and anxiety is doing great   No diagnosis found.  Outpatient Encounter Medications as of 08/22/2018  Medication Sig  . Cholecalciferol (HM VITAMIN D3) 4000 units CAPS Take 4,000 Units by mouth daily.  Marland Kitchen FLUoxetine (PROZAC) 40 MG capsule Take 1 capsule (40 mg total) by mouth daily.  . furosemide (LASIX) 20 MG tablet Take 1 tablet (20 mg total) by mouth daily.  . Galcanezumab-gnlm 120 MG/ML SOAJ INJECT (240MG ) FOR FIRST DOSE. THEN INECT (120MG ) ONCE EVERY 30 DAYS  . Lactulose 20 GM/30ML SOLN Take 30 mLs (20 g total) by mouth 3 (three) times daily as needed.  Marland Kitchen levothyroxine (SYNTHROID, LEVOTHROID) 75 MCG tablet TAKE 1 TABLET BY MOUTH EVERY DAY  . linaclotide (LINZESS) 290 MCG CAPS capsule Take 1 capsule (290 mcg total) by mouth daily before breakfast.  . Multiple Vitamin (MULTIVITAMIN WITH MINERALS) TABS tablet Take 1 tablet by mouth daily.  . ondansetron (ZOFRAN ODT) 4 MG disintegrating tablet Take 1 tablet (4 mg total) by mouth every 8 (eight)  hours as needed for nausea or vomiting.  . propranolol ER (INDERAL LA) 120 MG 24 hr capsule TAKE 1 CAPSULE BY MOUTH EVERY DAY  . rizatriptan (MAXALT) 10 MG tablet TAKE 1 TABLET BY MOUTH AS NEEDED FOR MIGRAINE. MAY REPEAT IN 2 HOURS IF NEEDED  . solifenacin (VESICARE) 5 MG tablet Take 1 tablet (5 mg total) by mouth daily.  Marland Kitchen topiramate (TOPAMAX) 100 MG tablet Take 1 tablet (100 mg total) by mouth 2 (two) times daily.   No facility-administered encounter medications on file as of 08/22/2018.     Review of Systems  Observations/Objective:   Assessment and Plan: Problem List Items Addressed This Visit    None    Visit Diagnoses    Anxiety    -  Primary       Follow Up Instructions:  F/u in 3 months  Continue fluoxetine as patient is doing well, she is also doing very well on Emgality as well so continue both.   I discussed the assessment and treatment plan with the patient. The patient was provided an opportunity to ask questions and all were answered. The patient agreed with the plan and demonstrated an understanding of the instructions.   The patient was advised to call back or seek an in-person evaluation if the symptoms worsen or if the condition fails to improve as anticipated.  The above assessment and management plan was discussed with the patient. The patient verbalized understanding of and has agreed to the management plan. Patient is aware to call the  clinic if symptoms persist or worsen. Patient is aware when to return to the clinic for a follow-up visit. Patient educated on when it is appropriate to go to the emergency department.    I provided 11 minutes of non-face-to-face time during this encounter.    Nils Pyle, MD

## 2018-09-12 ENCOUNTER — Other Ambulatory Visit: Payer: Self-pay | Admitting: Family Medicine

## 2018-09-12 ENCOUNTER — Other Ambulatory Visit: Payer: Self-pay | Admitting: Neurology

## 2018-09-12 DIAGNOSIS — G43009 Migraine without aura, not intractable, without status migrainosus: Secondary | ICD-10-CM

## 2018-09-12 NOTE — Telephone Encounter (Signed)
PT is needing a refill on zofran [facility]

## 2018-09-12 NOTE — Telephone Encounter (Signed)
Please advise Last OV 08/22/18. Rx'd 08/07/17 by Dr. Terrace Arabia

## 2018-09-13 MED ORDER — ONDANSETRON 4 MG PO TBDP: 4.0000 mg | ORAL_TABLET | Freq: Three times a day (TID) | ORAL | 11 refills | Status: DC | PRN

## 2018-09-23 ENCOUNTER — Other Ambulatory Visit: Payer: Self-pay | Admitting: Family Medicine

## 2018-10-22 ENCOUNTER — Telehealth: Payer: Self-pay | Admitting: Family Medicine

## 2018-10-22 ENCOUNTER — Other Ambulatory Visit: Payer: Self-pay

## 2018-10-23 ENCOUNTER — Ambulatory Visit: Payer: Medicaid Other | Admitting: Family Medicine

## 2018-10-24 ENCOUNTER — Emergency Department (HOSPITAL_COMMUNITY)
Admission: EM | Admit: 2018-10-24 | Discharge: 2018-10-24 | Disposition: A | Discharge status: Home / Self Care | Payer: Medicaid Other | Attending: Emergency Medicine | Admitting: Emergency Medicine

## 2018-10-24 ENCOUNTER — Other Ambulatory Visit: Payer: Self-pay

## 2018-10-24 ENCOUNTER — Encounter (HOSPITAL_COMMUNITY): Payer: Self-pay | Admitting: Emergency Medicine

## 2018-10-24 DIAGNOSIS — E876 Hypokalemia: Secondary | ICD-10-CM | POA: Diagnosis not present

## 2018-10-24 DIAGNOSIS — F419 Anxiety disorder, unspecified: Secondary | ICD-10-CM | POA: Diagnosis present

## 2018-10-24 DIAGNOSIS — E039 Hypothyroidism, unspecified: Secondary | ICD-10-CM | POA: Diagnosis not present

## 2018-10-24 DIAGNOSIS — I10 Essential (primary) hypertension: Secondary | ICD-10-CM | POA: Insufficient documentation

## 2018-10-24 DIAGNOSIS — F17210 Nicotine dependence, cigarettes, uncomplicated: Secondary | ICD-10-CM | POA: Diagnosis not present

## 2018-10-24 DIAGNOSIS — Z79899 Other long term (current) drug therapy: Secondary | ICD-10-CM | POA: Insufficient documentation

## 2018-10-24 LAB — CBC WITH DIFFERENTIAL/PLATELET
Abs Immature Granulocytes: 0.03 10*3/uL (ref 0.00–0.07)
Basophils Absolute: 0.1 10*3/uL (ref 0.0–0.1)
Basophils Relative: 1 %
Eosinophils Absolute: 0.1 10*3/uL (ref 0.0–0.5)
Eosinophils Relative: 1 %
HCT: 39.2 % (ref 36.0–46.0)
Hemoglobin: 13.8 g/dL (ref 12.0–15.0)
Immature Granulocytes: 0 %
Lymphocytes Relative: 32 %
Lymphs Abs: 2.5 10*3/uL (ref 0.7–4.0)
MCH: 30.9 pg (ref 26.0–34.0)
MCHC: 35.2 g/dL (ref 30.0–36.0)
MCV: 87.7 fL (ref 80.0–100.0)
Monocytes Absolute: 0.6 10*3/uL (ref 0.1–1.0)
Monocytes Relative: 8 %
Neutro Abs: 4.6 10*3/uL (ref 1.7–7.7)
Neutrophils Relative %: 58 %
Platelets: 224 10*3/uL (ref 150–400)
RBC: 4.47 MIL/uL (ref 3.87–5.11)
RDW: 12.9 % (ref 11.5–15.5)
WBC: 7.9 10*3/uL (ref 4.0–10.5)
nRBC: 0 % (ref 0.0–0.2)

## 2018-10-24 LAB — BASIC METABOLIC PANEL
Anion gap: 10 (ref 5–15)
BUN: 9 mg/dL (ref 6–20)
CO2: 22 mmol/L (ref 22–32)
Calcium: 8.4 mg/dL — ABNORMAL LOW (ref 8.9–10.3)
Chloride: 107 mmol/L (ref 98–111)
Creatinine, Ser: 1.09 mg/dL — ABNORMAL HIGH (ref 0.44–1.00)
GFR calc Af Amer: >60 mL/min (ref >60)
GFR calc non Af Amer: >60 mL/min (ref >60)
Glucose, Bld: 95 mg/dL (ref 70–99)
Potassium: 3 mmol/L — ABNORMAL LOW (ref 3.5–5.1)
Sodium: 139 mmol/L (ref 135–145)

## 2018-10-24 MED ORDER — LORAZEPAM 2 MG/ML IJ SOLN
1.0000 mg | Freq: Once | INTRAMUSCULAR | Status: AC
  Administered 2018-10-24: 1 mg via INTRAVENOUS
  Filled 2018-10-24: qty 1

## 2018-10-24 MED ORDER — POTASSIUM CHLORIDE CRYS ER 20 MEQ PO TBCR
40.0000 meq | EXTENDED_RELEASE_TABLET | Freq: Once | ORAL | Status: AC
  Administered 2018-10-24: 40 meq via ORAL
  Filled 2018-10-24: qty 2

## 2018-10-24 MED ORDER — KETOROLAC TROMETHAMINE 30 MG/ML IJ SOLN
30.0000 mg | Freq: Once | INTRAMUSCULAR | Status: AC
  Administered 2018-10-24: 30 mg via INTRAVENOUS
  Filled 2018-10-24: qty 1

## 2018-10-24 NOTE — ED Triage Notes (Signed)
Pt C/O anxiety and muscle spasms all over. Pt stating she missed her potassium supplement yesterday so she has taken a double dose today.

## 2018-10-24 NOTE — ED Notes (Signed)
500 NS bag started by EMS has infused.

## 2018-10-24 NOTE — Discharge Instructions (Addendum)
Take your medicines as prescribed by your doctor.  And follow-up with your doctor if any problems

## 2018-10-24 NOTE — ED Provider Notes (Signed)
[facility] EMERGENCY DEPARTMENT Provider Note   CSN: [id number] Arrival date & time: 10/24/18  0044    History   Chief Complaint Chief Complaint  Patient presents with   Anxiety    HPI Pam Chen is a 48 y.o. female.     Patient states she has not been taking her medicines for 3 days and now she feels very anxious and she is having muscle cramps.  She states this happened before when her potassium was low  The history is provided by the patient. No language interpreter was used.  Anxiety  This is a recurrent problem. The current episode started 2 days ago. The problem occurs constantly. The problem has not changed since onset.Pertinent negatives include no chest pain, no abdominal pain and no headaches. Nothing aggravates the symptoms. Nothing relieves the symptoms. She has tried nothing for the symptoms. The treatment provided no relief.    Past Medical History:  Diagnosis Date   Arthritis    Diverticulitis    GERD (gastroesophageal reflux disease)    Hypertension    Kidney stones    Migraine    Rotator cuff injury    Sleep apnea    CPAP   Tennis elbow    Thyroid disease     Patient Active Problem List   Diagnosis Date Noted   Hyperlipidemia LDL goal <130 05/01/2017   Current smoker 03/07/2017   Obesity (BMI 30.0-34.9) 02/02/2017   Hypothyroid 02/02/2017   GERD (gastroesophageal reflux disease) 02/02/2017   Migraine 02/02/2017   Bipolar 1 disorder, depressed (HCC) 02/02/2017    Past Surgical History:  Procedure Laterality Date   ABDOMINAL HYSTERECTOMY     COLON SURGERY     diverticulitis   LITHOTRIPSY     SLEEVE GASTROPLASTY       OB History   No obstetric history on file.      Home Medications    Prior to Admission medications   Medication Sig Start Date End Date Taking? Authorizing Provider  buprenorphine-naloxone (SUBOXONE) 8-2 mg SUBL SL tablet Place 1 tablet under the tongue 3 (three) times daily as needed.     [provider]  Cholecalciferol (HM VITAMIN D3) 4000 units CAPS Take 4,000 Units by mouth daily.    [provider]  FLUoxetine (PROZAC) 40 MG capsule Take 1 capsule (40 mg total) by mouth daily. 07/22/18   [name], [name], MD  furosemide (LASIX) 20 MG tablet Take 1 tablet (20 mg total) by mouth daily. 04/24/18   [name], [name], MD  Galcanezumab-gnlm 120 MG/ML SOAJ INJECT ( ) FOR FIRST DOSE. THEN INECT ( ) ONCE EVERY 30 DAYS 07/05/18   [name], [name], MD  Lactulose 20 GM/30ML SOLN Take 30 mLs (20 g total) by mouth 3 (three) times daily as needed. 03/29/18   Johna Sheriff, MD  levothyroxine (SYNTHROID, LEVOTHROID) 75 MCG tablet TAKE 1 TABLET BY MOUTH EVERY DAY 08/05/18   [name], [name], MD  linaclotide [facility]) 290 MCG CAPS capsule Take 1 capsule (290 mcg total) by mouth daily before breakfast. 05/14/18   Johna Sheriff, MD  Multiple Vitamin (MULTIVITAMIN WITH MINERALS) TABS tablet Take 1 tablet by mouth daily.    [provider]  ondansetron (ZOFRAN ODT) 4 MG disintegrating tablet Take 1 tablet (4 mg total) by mouth every 8 (eight) hours as needed for nausea or vomiting. 09/13/18   [name], [name], MD  propranolol ER (INDERAL LA) 120 MG 24 hr capsule TAKE 1 CAPSULE BY MOUTH EVERY  DAY 01/31/18   [name], [name], MD  rizatriptan (MAXALT) 10 MG tablet TAKE 1 TABLET BY MOUTH AS NEEDED FOR MIGRAINE. MAY REPEAT IN 2 HOURS IF NEEDED 08/08/18   [name], [name], MD  solifenacin (VESICARE) 5 MG tablet Take 1 tablet (5 mg total) by mouth daily. 09/26/17   [name], [name], MD  topiramate (TOPAMAX) 100 MG tablet Take 1 tablet (100 mg total) by mouth 2 (two) times daily. 08/07/17   Levert Feinstein, MD    Family History Family History  Problem Relation Age of Onset   Hypertension Father    Sleep apnea Father    Cancer Maternal Grandmother        breast   Alzheimer's disease Maternal Grandmother    Diabetes Maternal Grandfather     Cancer Paternal Grandmother        ovarian   Hypertension Paternal Grandfather    Heart disease Paternal Grandfather    Diabetes Paternal Grandfather    Hyperlipidemia Brother     Social History Social History   Tobacco Use   Smoking status: Current Every Day Smoker    Packs/day: 0.30    Types: Cigarettes   Smokeless tobacco: Never Used   Tobacco comment: down to 3-4 cigarettes per day - trying to stop  Substance Use Topics   Alcohol use: No   Drug use: Not Currently    Comment: currently in drug recovery, sober x 5 months as of 08/02/18     Allergies   Aspirin; Oxycodone-acetaminophen; and Tramadol hcl   Review of Systems Review of Systems  Constitutional: Negative for appetite change and fatigue.  HENT: Negative for congestion, ear discharge and sinus pressure.   Eyes: Negative for discharge.  Respiratory: Negative for cough.   Cardiovascular: Negative for chest pain.  Gastrointestinal: Negative for abdominal pain and diarrhea.  Genitourinary: Negative for frequency and hematuria.  Musculoskeletal: Negative for back pain.  Skin: Negative for rash.  Neurological: Negative for seizures and headaches.  Psychiatric/Behavioral: Positive for agitation. Negative for hallucinations.     Physical Exam Updated Vital Signs BP 99/67    Pulse 91    Resp 13    SpO2 97%   Physical Exam Vitals signs and nursing note reviewed.  Constitutional:      Appearance: She is well-developed.  HENT:     Head: Normocephalic.     Nose: Nose normal.  Eyes:     General: No scleral icterus.    Conjunctiva/sclera: Conjunctivae normal.  Neck:     Musculoskeletal: Neck supple.     Thyroid: No thyromegaly.  Cardiovascular:     Rate and Rhythm: Normal rate and regular rhythm.     Heart sounds: No murmur. No friction rub. No gallop.   Pulmonary:     Breath sounds: No stridor. No wheezing or rales.  Chest:     Chest wall: No tenderness.  Abdominal:     General: There is no  distension.     Tenderness: There is no abdominal tenderness. There is no rebound.  Musculoskeletal: Normal range of motion.  Lymphadenopathy:     Cervical: No cervical adenopathy.  Skin:    Findings: No erythema or rash.  Neurological:     Mental Status: She is oriented to person, place, and time.     Motor: No abnormal muscle tone.     Coordination: Coordination normal.  Psychiatric:     Comments: Patient very anxious      ED Treatments / Results  Labs (all labs  ordered are listed, but only abnormal results are displayed) Labs Reviewed  BASIC METABOLIC PANEL - Abnormal; Notable for the following components:      Result Value   Potassium 3.0 (*)    Creatinine, Ser 1.09 (*)    Calcium 8.4 (*)    All other components within normal limits  CBC WITH DIFFERENTIAL/PLATELET    EKG None  Radiology No results found.  Procedures Procedures (including critical care time)  Medications Ordered in ED Medications  potassium chloride SA (K-DUR) CR tablet 40 mEq (has no administration in time range)  LORazepam (ATIVAN) injection 1 mg (1 mg Intravenous Given 10/24/18 0052)  ketorolac (TORADOL) 30 MG/ML injection 30 mg (30 mg Intravenous Given 10/24/18 0052)     Initial Impression / Assessment and Plan / ED Course  I have reviewed the triage vital signs and the nursing notes.  Pertinent labs & imaging results that were available during my care of the patient were reviewed by me and considered in my medical decision making (see chart for details).        Labs reviewed and unremarkable except for low potassium.  Patient's anxiety improved with Ativan.  She is also given some potassium.  Patient is discharged home to follow-up with her PCP and she is instructed to make sure she takes her medicines now  Final Clinical Impressions(s) / ED Diagnoses   Final diagnoses:  Anxiety  Hypokalemia    ED Discharge Orders    None       Bethann BerkshireZammit, Chasiti Waddington, MD 10/24/18 [phone]

## 2018-10-29 ENCOUNTER — Telehealth: Payer: Self-pay | Admitting: Family Medicine

## 2018-10-29 NOTE — Telephone Encounter (Signed)
appt scheduled

## 2018-10-30 ENCOUNTER — Encounter: Payer: Self-pay | Admitting: Family Medicine

## 2018-10-30 ENCOUNTER — Ambulatory Visit: Payer: Medicaid Other | Admitting: Family Medicine

## 2018-10-30 ENCOUNTER — Other Ambulatory Visit: Payer: Self-pay

## 2018-10-30 VITALS — BP 129/87 | HR 67 | Temp 97.5°F | Ht 67.0 in | Wt 201.0 lb

## 2018-10-30 DIAGNOSIS — F1990 Other psychoactive substance use, unspecified, uncomplicated: Secondary | ICD-10-CM

## 2018-10-30 DIAGNOSIS — E039 Hypothyroidism, unspecified: Secondary | ICD-10-CM | POA: Diagnosis not present

## 2018-10-30 DIAGNOSIS — F319 Bipolar disorder, unspecified: Secondary | ICD-10-CM

## 2018-10-30 DIAGNOSIS — F1510 Other stimulant abuse, uncomplicated: Secondary | ICD-10-CM

## 2018-10-30 DIAGNOSIS — G43009 Migraine without aura, not intractable, without status migrainosus: Secondary | ICD-10-CM

## 2018-10-30 DIAGNOSIS — E876 Hypokalemia: Secondary | ICD-10-CM

## 2018-10-30 MED ORDER — PROPRANOLOL HCL ER 120 MG PO CP24: 120.0000 mg | ORAL_CAPSULE | Freq: Every day | ORAL | 3 refills | Status: DC

## 2018-10-30 MED ORDER — HYDROXYZINE HCL 50 MG PO TABS: 50.0000 mg | ORAL_TABLET | Freq: Three times a day (TID) | ORAL | 0 refills | Status: DC | PRN

## 2018-10-30 MED ORDER — LEVOTHYROXINE SODIUM 75 MCG PO TABS: 75.0000 ug | ORAL_TABLET | Freq: Every day | ORAL | 3 refills | Status: DC

## 2018-10-30 MED ORDER — ONDANSETRON 4 MG PO TBDP: 4.0000 mg | ORAL_TABLET | Freq: Three times a day (TID) | ORAL | 11 refills | Status: DC | PRN

## 2018-10-30 NOTE — Progress Notes (Signed)
BP 129/87   Pulse 67   Temp (!) 97.5 F (36.4 C) (Oral)   Ht _0  (1.702 m)   Wt 201 lb (91.2 kg)   BMI 31.48 kg/m    Subjective:   Patient ID: Pam Chen, female    DOB: 05/13/1971, 48 y.o.   MRN: [id number]  HPI: Pam Chen is a 48 y.o. female presenting on 10/30/2018 for er follow up (5/28 - Anxiety ); Nausea; and Fatigue   HPI ER follow-up for anxiety and nausea and fatigue and drug recovery Patient is coming in for hospital follow-up and ER follow-up for nausea and fatigue and drug overdose and palpitations and chest pains.  She was admitted on 2 different occasions for withdrawal from different substances.  The first time she was admitted to [facility] on 526 in the ED and sent home from the ED.  Then second time she was seen in the emergency department on 10/24/2018 and was found to have anxiety and hypokalemia.  She is also coming in for checkup of bipolar and anxiety.  She wants to get back on some of her medications that she was on previously including Maxalt and propranolol and levothyroxine and hydroxyzine.  He is planning to admit her self to a drug rehabilitation program.  Did have a history of using injectable drugs over the past couple weeks but does states she uses a clean needle and only did it once and still wants to be tested for it.  Relevant past medical, surgical, family and social history reviewed and updated as indicated. Interim medical history since our last visit reviewed. Allergies and medications reviewed and updated.  Review of Systems  Constitutional: Negative for chills and fever.  Eyes: Negative for visual disturbance.  Respiratory: Negative for chest tightness and shortness of breath.   Cardiovascular: Positive for palpitations. Negative for chest pain and leg swelling.  Musculoskeletal: Negative for back pain and gait problem.  Skin: Negative for rash.  Neurological: Positive for dizziness. Negative for weakness, light-headedness and  headaches.  Psychiatric/Behavioral: Positive for dysphoric mood. Negative for agitation, behavioral problems, self-injury, sleep disturbance and suicidal ideas. The patient is nervous/anxious.   All other systems reviewed and are negative.   Per HPI unless specifically indicated above   Allergies as of 10/30/2018      Reactions   Aspirin Other (See Comments)   Upset tummy.  Can take ibuprofen.   Oxycodone-acetaminophen Anxiety   Can take vicodin   Tramadol Hcl Nausea Only      Medication List       Accurate as of October 30, 2018 11:59 PM. If you have any questions, ask your nurse or doctor.        STOP taking these medications   buprenorphine-naloxone 8-2 mg Subl SL tablet Commonly known as:  SUBOXONE Stopped by:  Fransisca Kaufmann [name], MD   solifenacin 5 MG tablet Commonly known as:  VESIcare Stopped by:  Worthy Rancher, MD     TAKE these medications   FLUoxetine 40 MG capsule Commonly known as:  PROZAC Take 1 capsule (40 mg total) by mouth daily.   furosemide 20 MG tablet Commonly known as:  LASIX Take 1 tablet (20 mg total) by mouth daily.   Galcanezumab-gnlm 120 MG/ML Soaj INJECT 2ML (240MG) FOR FIRST DOSE. THEN INECT 1ML (120MG) ONCE EVERY 30 DAYS   HM Vitamin D3 100 MCG (4000 UT) Caps Generic drug:  Cholecalciferol Take 4,000 Units by mouth daily.   hydrOXYzine 50 MG  tablet Commonly known as:  ATARAX/VISTARIL Take 1 tablet (50 mg total) by mouth 3 (three) times daily as needed. Started by:  Fransisca Kaufmann [name], MD   Lactulose 20 GM/30ML Soln Take 30 mLs (20 g total) by mouth 3 (three) times daily as needed.   levothyroxine 75 MCG tablet Commonly known as:  SYNTHROID Take 1 tablet (75 mcg total) by mouth daily.   linaclotide 290 MCG Caps capsule Commonly known as:  LINZESS Take 1 capsule (290 mcg total) by mouth daily before breakfast.   multivitamin with minerals Tabs tablet Take 1 tablet by mouth daily.   ondansetron 4 MG disintegrating tablet  Commonly known as:  Zofran ODT Take 1 tablet (4 mg total) by mouth every 8 (eight) hours as needed for nausea or vomiting.   propranolol ER 120 MG 24 hr capsule Commonly known as:  INDERAL LA Take 1 capsule (120 mg total) by mouth at bedtime. What changed:    how much to take  when to take this Changed by:  Worthy Rancher, MD   rizatriptan 10 MG tablet Commonly known as:  MAXALT TAKE 1 TABLET BY MOUTH AS NEEDED FOR MIGRAINE. MAY REPEAT IN 2 HOURS IF NEEDED   topiramate 100 MG tablet Commonly known as:  TOPAMAX Take 1 tablet (100 mg total) by mouth 2 (two) times daily.        Objective:   BP 129/87   Pulse 67   Temp (!) 97.5 F (36.4 C) (Oral)   Ht _0  (1.702 m)   Wt 201 lb (91.2 kg)   BMI 31.48 kg/m   Wt Readings from Last 3 Encounters:  10/30/18 201 lb (91.2 kg)  08/02/18 212 lb (96.2 kg)  07/22/18 225 lb 6.4 oz (102.2 kg)    Physical Exam Vitals signs and nursing note reviewed.  Constitutional:      General: She is not in acute distress.    Appearance: She is well-developed. She is not diaphoretic.  Eyes:     Conjunctiva/sclera: Conjunctivae normal.  Cardiovascular:     Rate and Rhythm: Normal rate and regular rhythm.     Heart sounds: Normal heart sounds. No murmur.  Pulmonary:     Effort: Pulmonary effort is normal. No respiratory distress.     Breath sounds: Normal breath sounds. No wheezing, rhonchi or rales.  Musculoskeletal: Normal range of motion.        General: No tenderness.  Skin:    General: Skin is warm and dry.     Findings: No rash.  Neurological:     Mental Status: She is alert and oriented to person, place, and time.     Coordination: Coordination normal.  Psychiatric:        Behavior: Behavior normal.       Assessment & Plan:   Problem List Items Addressed This Visit      Cardiovascular and Mediastinum   Migraine   Relevant Medications   ondansetron (ZOFRAN ODT) 4 MG disintegrating tablet   propranolol ER (INDERAL  LA) 120 MG 24 hr capsule     Endocrine   Hypothyroid   Relevant Medications   propranolol ER (INDERAL LA) 120 MG 24 hr capsule   levothyroxine (SYNTHROID) 75 MCG tablet   Other Relevant Orders   TSH (Completed)     Other   Bipolar 1 disorder, depressed ([place]) - Primary   Relevant Orders   CBC with Differential/Platelet (Completed)    Other Visit Diagnoses    Methamphetamine use ([place])  Relevant Orders   CMP14+EGFR (Completed)   Hypokalemia       Relevant Orders   CMP14+EGFR (Completed)   Injection of illicit drug within last 12 months       Relevant Orders   HIV Antibody (routine testing w rflx) (Completed)   Hepatitis C antibody (Completed)    Restart propranolol and levothyroxine and Inderal and will give Zofran for nausea.  Follow up plan: Return in about 3 months (around 01/30/2019), or if symptoms worsen or fail to improve.  Counseling provided for all of the vaccine components Orders Placed This Encounter  Procedures  . CBC with Differential/Platelet  . CMP14+EGFR  . TSH  . HIV Antibody (routine testing w rflx)  . Hepatitis C antibody    Caryl Pina, MD [facility] 11/04/2018, 7:45 PM

## 2018-10-31 LAB — CMP14+EGFR
ALT: 11 IU/L (ref 0–32)
AST: 14 IU/L (ref 0–40)
Albumin/Globulin Ratio: 1.6 (ref 1.2–2.2)
Albumin: 4.2 g/dL (ref 3.8–4.8)
Alkaline Phosphatase: 68 IU/L (ref 39–117)
BUN/Creatinine Ratio: 8 — ABNORMAL LOW (ref 9–23)
BUN: 9 mg/dL (ref 6–24)
Bilirubin Total: 0.4 mg/dL (ref 0.0–1.2)
CO2: 24 mmol/L (ref 20–29)
Calcium: 10.3 mg/dL — ABNORMAL HIGH (ref 8.7–10.2)
Chloride: 102 mmol/L (ref 96–106)
Creatinine, Ser: 1.15 mg/dL — ABNORMAL HIGH (ref 0.57–1.00)
GFR calc Af Amer: 66 mL/min/{1.73_m2} (ref >59)
GFR calc non Af Amer: 57 mL/min/{1.73_m2} — ABNORMAL LOW (ref >59)
Globulin, Total: 2.6 g/dL (ref 1.5–4.5)
Glucose: 96 mg/dL (ref 65–99)
Potassium: 5.2 mmol/L (ref 3.5–5.2)
Sodium: 144 mmol/L (ref 134–144)
Total Protein: 6.8 g/dL (ref 6.0–8.5)

## 2018-10-31 LAB — CBC WITH DIFFERENTIAL/PLATELET
Basophils Absolute: 0.1 10*3/uL (ref 0.0–0.2)
Basos: 1 %
EOS (ABSOLUTE): 0.1 10*3/uL (ref 0.0–0.4)
Eos: 1 %
Hematocrit: 47.5 % — ABNORMAL HIGH (ref 34.0–46.6)
Hemoglobin: 16.4 g/dL — ABNORMAL HIGH (ref 11.1–15.9)
Immature Grans (Abs): 0 10*3/uL (ref 0.0–0.1)
Immature Granulocytes: 0 %
Lymphocytes Absolute: 1.9 10*3/uL (ref 0.7–3.1)
Lymphs: 25 %
MCH: 30.6 pg (ref 26.6–33.0)
MCHC: 34.5 g/dL (ref 31.5–35.7)
MCV: 89 fL (ref 79–97)
Monocytes Absolute: 0.5 10*3/uL (ref 0.1–0.9)
Monocytes: 6 %
Neutrophils Absolute: 5 10*3/uL (ref 1.4–7.0)
Neutrophils: 67 %
Platelets: 255 10*3/uL (ref 150–450)
RBC: 5.36 x10E6/uL — ABNORMAL HIGH (ref 3.77–5.28)
RDW: 13.6 % (ref 11.7–15.4)
WBC: 7.5 10*3/uL (ref 3.4–10.8)

## 2018-10-31 LAB — HEPATITIS C ANTIBODY: Hep C Virus Ab: <0.1 s/co ratio (ref 0.0–0.9)

## 2018-10-31 LAB — HIV ANTIBODY (ROUTINE TESTING W REFLEX): HIV Screen 4th Generation wRfx: NONREACTIVE

## 2018-10-31 LAB — TSH: TSH: 2.49 u[IU]/mL (ref 0.450–4.500)

## 2018-12-06 ENCOUNTER — Encounter: Payer: Self-pay | Admitting: Family

## 2018-12-06 ENCOUNTER — Ambulatory Visit (INDEPENDENT_AMBULATORY_CARE_PROVIDER_SITE_OTHER): Payer: Medicaid Other | Admitting: Family

## 2018-12-06 DIAGNOSIS — R109 Unspecified abdominal pain: Secondary | ICD-10-CM | POA: Diagnosis not present

## 2018-12-06 DIAGNOSIS — Z87442 Personal history of urinary calculi: Secondary | ICD-10-CM | POA: Diagnosis not present

## 2018-12-06 MED ORDER — TAMSULOSIN HCL 0.4 MG PO CAPS: 0.4000 mg | ORAL_CAPSULE | Freq: Every day | ORAL | 3 refills | Status: DC

## 2018-12-06 NOTE — Progress Notes (Signed)
   Virtual Visit via telephone Note  I connected with Pam Chen on 12/06/18 at 3:16 pm  by telephone and verified that I am speaking with the correct person using two identifiers. Pam Chen is currently located at work  and no one  is currently with her during visit. The provider, Evelina Dun, FNP is located in their office at time of visit.  I discussed the limitations, risks, security and privacy concerns of performing an evaluation and management service by telephone and the availability of in person appointments. I also discussed with the patient that there may be a patient responsible charge related to this service. The patient expressed understanding and agreed to proceed.   History and Present Illness:  Pt presents to the office to for right flank pain. She states she has a hx of Kidney stones since she was 15 years and has passes "probably  40 stones in my life".  Flank Pain This is a recurrent problem. The current episode started in the past 7 days. The problem occurs intermittently. The problem has been waxing and waning since onset. Pain location: right flank. The quality of the pain is described as shooting and aching. The pain is at a severity of 4/10. The pain is moderate. Pertinent negatives include no dysuria, leg pain, numbness or weakness. (Urinary frequency, nausea) She has tried bed rest for the symptoms. The treatment provided mild relief.      Review of Systems  Genitourinary: Positive for flank pain. Negative for dysuria.  Neurological: Negative for weakness and numbness.  All other systems reviewed and are negative.    Observations/Objective: No SOB or distress noted  Assessment and Plan: 1. Flank pain - tamsulosin (FLOMAX) 0.4 MG CAPS capsule; Take 1 capsule (0.4 mg total) by mouth daily.  Dispense: 30 capsule; Refill: 3  2. History of kidney stones - tamsulosin (FLOMAX) 0.4 MG CAPS capsule; Take 1 capsule (0.4 mg total) by mouth daily.  Dispense: 30  capsule; Refill: 3   Suspected Kidney stone, will start flomax  Force fluids She has rx of zofran that she will take as needed IF any changes with urine output go to ED Call office if symptoms worsen or do not improve     I discussed the assessment and treatment plan with the patient. The patient was provided an opportunity to ask questions and all were answered. The patient agreed with the plan and demonstrated an understanding of the instructions.   The patient was advised to call back or seek an in-person evaluation if the symptoms worsen or if the condition fails to improve as anticipated.  The above assessment and management plan was discussed with the patient. The patient verbalized understanding of and has agreed to the management plan. Patient is aware to call the clinic if symptoms persist or worsen. Patient is aware when to return to the clinic for a follow-up visit. Patient educated on when it is appropriate to go to the emergency department.   Time call ended: 3:27 pm   I provided 11 minutes of non-face-to-face time during this encounter.    Evelina Dun, FNP

## 2018-12-09 ENCOUNTER — Ambulatory Visit: Payer: Medicaid Other | Admitting: Family

## 2018-12-09 ENCOUNTER — Encounter: Payer: Self-pay | Admitting: Family

## 2018-12-09 ENCOUNTER — Other Ambulatory Visit: Payer: Self-pay

## 2018-12-09 VITALS — BP 108/70 | HR 78 | Temp 98.4°F | Ht 67.0 in | Wt 205.2 lb

## 2018-12-09 DIAGNOSIS — N200 Calculus of kidney: Secondary | ICD-10-CM | POA: Diagnosis not present

## 2018-12-09 DIAGNOSIS — E669 Obesity, unspecified: Secondary | ICD-10-CM | POA: Diagnosis not present

## 2018-12-09 DIAGNOSIS — F17200 Nicotine dependence, unspecified, uncomplicated: Secondary | ICD-10-CM | POA: Diagnosis not present

## 2018-12-09 DIAGNOSIS — R350 Frequency of micturition: Secondary | ICD-10-CM | POA: Diagnosis not present

## 2018-12-09 DIAGNOSIS — E66811 Obesity, class 1: Secondary | ICD-10-CM

## 2018-12-09 DIAGNOSIS — R1031 Right lower quadrant pain: Secondary | ICD-10-CM

## 2018-12-09 LAB — URINALYSIS, COMPLETE
Bilirubin, UA: NEGATIVE
Glucose, UA: NEGATIVE
Ketones, UA: NEGATIVE
Leukocytes,UA: NEGATIVE
Nitrite, UA: NEGATIVE
Protein,UA: NEGATIVE
RBC, UA: NEGATIVE
Specific Gravity, UA: 1.025 (ref 1.005–1.030)
Urobilinogen, Ur: 1 mg/dL (ref 0.2–1.0)
pH, UA: 7.5 (ref 5.0–7.5)

## 2018-12-09 LAB — MICROSCOPIC EXAMINATION
Epithelial Cells (non renal): NONE SEEN /hpf (ref 0–10)
RBC, Urine: NONE SEEN /hpf (ref 0–2)
Renal Epithel, UA: NONE SEEN /hpf
WBC, UA: NONE SEEN /hpf (ref 0–5)

## 2018-12-09 NOTE — Progress Notes (Signed)
Subjective:    Patient ID: Pam Chen, female    DOB: 07/25/1970, 48 y.o.   MRN: [id number]  Chief Complaint  Patient presents with  . Nephrolithiasis  . Urinary Frequency  . Nausea   Pt presents to the office today with recurrent right flank pain. She has a history of kidney stones and started flomax Friday. She states she feels like she is going more frequent, but her pain has worsens. She is worried that this my be a UTI.  Urinary Frequency  This is a new problem. The current episode started in the past 7 days. The problem occurs every urination. The problem has been waxing and waning. The quality of the pain is described as burning. The pain is at a severity of 6/10. The pain is moderate. Associated symptoms include flank pain, frequency, nausea and urgency. Pertinent negatives include no hematuria, hesitancy or vomiting. She has tried increased fluids (flomax and zofran ) for the symptoms. The treatment provided mild relief.      Review of Systems  Gastrointestinal: Positive for nausea. Negative for vomiting.  Genitourinary: Positive for flank pain, frequency and urgency. Negative for hematuria and hesitancy.  All other systems reviewed and are negative.      Objective:   Physical Exam Vitals signs reviewed.  Constitutional:      General: She is not in acute distress.    Appearance: She is well-developed.  HENT:     Head: Normocephalic and atraumatic.     Right Ear: Tympanic membrane normal.     Left Ear: Tympanic membrane normal.  Eyes:     Pupils: Pupils are equal, round, and reactive to light.  Neck:     Musculoskeletal: Normal range of motion and neck supple.     Thyroid: No thyromegaly.  Cardiovascular:     Rate and Rhythm: Normal rate and regular rhythm.     Heart sounds: Normal heart sounds. No murmur.  Pulmonary:     Effort: Pulmonary effort is normal. No respiratory distress.     Breath sounds: Normal breath sounds. No wheezing.  Abdominal:     General:  Bowel sounds are normal. There is no distension.     Palpations: Abdomen is soft.     Tenderness: There is no abdominal tenderness.  Musculoskeletal: Normal range of motion.        General: No tenderness.  Skin:    General: Skin is warm and dry.  Neurological:     Mental Status: She is alert and oriented to person, place, and time.     Cranial Nerves: No cranial nerve deficit.     Deep Tendon Reflexes: Reflexes are normal and symmetric.  Psychiatric:        Behavior: Behavior normal.        Thought Content: Thought content normal.        Judgment: Judgment normal.       BP 108/70   Pulse 78   Temp 98.4 F (36.9 C) (Oral)   Ht 5\' 7"  (1.702 m)   Wt 205 lb 3.2 oz (93.1 kg)   BMI 32.14 kg/m      Assessment & Plan:  Pam Chen comes in today with chief complaint of Nephrolithiasis, Urinary Frequency, and Nausea   Diagnosis and orders addressed:  1. Kidney stones - CT RENAL STONE STUDY; Future  2. Urinary frequency - Urinalysis, Complete - CT RENAL STONE STUDY; Future  3. Current smoker - CT RENAL STONE STUDY; Future  4. Obesity (BMI 30.0-34.9) -  CT RENAL STONE STUDY; Future  5. Right lower quadrant abdominal pain - CT RENAL STONE STUDY; Future  Continue Flomax  Force fluids  Call if changes in urine outpt Referral pending to Urologists    Jannifer Rodneyhristy Hawks, FNP

## 2018-12-09 NOTE — Patient Instructions (Signed)

## 2018-12-12 ENCOUNTER — Telehealth: Payer: Self-pay | Admitting: Family

## 2018-12-12 NOTE — Telephone Encounter (Signed)
Per pt: we need to cancel appt for CT at [facility] and call [facility] to notify of cancellation as well =  So that they will approve her to see [facility] and have CT done there tomorrow.  Please do asap

## 2018-12-13 ENCOUNTER — Other Ambulatory Visit: Payer: Self-pay

## 2018-12-13 ENCOUNTER — Other Ambulatory Visit: Payer: Self-pay | Admitting: Family

## 2018-12-13 ENCOUNTER — Ambulatory Visit (HOSPITAL_COMMUNITY)
Admission: RE | Admit: 2018-12-13 | Discharge: 2018-12-13 | Disposition: A | Discharge status: Home / Self Care | Payer: Medicaid Other | Source: Ambulatory Visit | Attending: Family | Admitting: Family

## 2018-12-13 DIAGNOSIS — R109 Unspecified abdominal pain: Secondary | ICD-10-CM | POA: Diagnosis not present

## 2018-12-17 ENCOUNTER — Ambulatory Visit (HOSPITAL_COMMUNITY): Payer: Medicaid Other

## 2019-01-17 ENCOUNTER — Other Ambulatory Visit: Payer: Self-pay

## 2019-01-17 ENCOUNTER — Ambulatory Visit (INDEPENDENT_AMBULATORY_CARE_PROVIDER_SITE_OTHER): Payer: Medicaid Other | Admitting: Family

## 2019-01-17 ENCOUNTER — Encounter: Payer: Self-pay | Admitting: Family

## 2019-01-17 DIAGNOSIS — J208 Acute bronchitis due to other specified organisms: Secondary | ICD-10-CM

## 2019-01-17 DIAGNOSIS — B9689 Other specified bacterial agents as the cause of diseases classified elsewhere: Secondary | ICD-10-CM | POA: Diagnosis not present

## 2019-01-17 MED ORDER — BENZONATATE 200 MG PO CAPS: 200.0000 mg | ORAL_CAPSULE | Freq: Three times a day (TID) | ORAL | 1 refills | Status: DC | PRN

## 2019-01-17 MED ORDER — AZITHROMYCIN 250 MG PO TABS: ORAL_TABLET | ORAL | 0 refills | Status: DC

## 2019-01-17 MED ORDER — PREDNISONE 10 MG (21) PO TBPK: ORAL_TABLET | ORAL | 0 refills | Status: DC

## 2019-01-17 NOTE — Progress Notes (Signed)
Virtual Visit via telephone Note Due to COVID-19 pandemic this visit was conducted virtually. This visit type was conducted due to national recommendations for restrictions regarding the COVID-19 Pandemic (e.g. social distancing, sheltering in place) in an effort to limit this patient's exposure and mitigate transmission in our community. All issues noted in this document were discussed and addressed.  A physical exam was not performed with this format.  I connected with Pam Chen on 01/17/19 at 10:28 AM by telephone and verified that I am speaking with the correct person using two identifiers. Pam Chen is currently located at home and no one  is currently with her during visit. The provider, Evelina Dun, FNP is located in their office at time of visit.  I discussed the limitations, risks, security and privacy concerns of performing an evaluation and management service by telephone and the availability of in person appointments. I also discussed with the patient that there may be a patient responsible charge related to this service. The patient expressed understanding and agreed to proceed.   History and Present Illness:  PT calls the office today with recurrent cough. She had a negative COVID test on  01/08/19.  Cough This is a new problem. The current episode started 1 to 4 weeks ago. The problem has been gradually worsening. The problem occurs every few minutes. The cough is productive of brown sputum. Associated symptoms include headaches, myalgias, nasal congestion, shortness of breath and wheezing. Pertinent negatives include no chills, ear congestion, ear pain, fever or sore throat. The symptoms are aggravated by lying down. Risk factors for lung disease include smoking/tobacco exposure. She has tried rest and a beta-agonist inhaler for the symptoms. The treatment provided mild relief.      Review of Systems  Constitutional: Negative for chills and fever.  HENT: Negative for  ear pain and sore throat.   Respiratory: Positive for cough, shortness of breath and wheezing.   Musculoskeletal: Positive for myalgias.  Neurological: Positive for headaches.  All other systems reviewed and are negative.    Observations/Objective: No SOB or distress noted, intermittent coarse cough, slight hoarseness noted  Assessment and Plan: 1. Acute bacterial bronchitis - Take meds as prescribed - Use a cool mist humidifier  -Use saline nose sprays frequently -Force fluids -For any cough or congestion  Use plain Mucinex- regular strength or max strength is fine -For fever or aces or pains- take tylenol or ibuprofen. -Throat lozenges if help -Call office if symptoms worsen or do not improve  - azithromycin (ZITHROMAX) 250 MG tablet; Take 500 mg once, then 250 mg for four days  Dispense: 6 tablet; Refill: 0 - predniSONE (STERAPRED UNI-PAK 21 TAB) 10 MG (21) TBPK tablet; Use as directed  Dispense: 21 tablet; Refill: 0 - benzonatate (TESSALON) 200 MG capsule; Take 1 capsule (200 mg total) by mouth 3 (three) times daily as needed.  Dispense: 30 capsule; Refill: 1     I discussed the assessment and treatment plan with the patient. The patient was provided an opportunity to ask questions and all were answered. The patient agreed with the plan and demonstrated an understanding of the instructions.   The patient was advised to call back or seek an in-person evaluation if the symptoms worsen or if the condition fails to improve as anticipated.  The above assessment and management plan was discussed with the patient. The patient verbalized understanding of and has agreed to the management plan. Patient is aware to call the clinic if symptoms persist  or worsen. Patient is aware when to return to the clinic for a follow-up visit. Patient educated on when it is appropriate to go to the emergency department.   Time call ended: 10:36 AM   I provided 8 minutes of non-face-to-face time during  this encounter.    Jannifer Rodneyhristy Jaleena Viviani, FNP

## 2019-01-22 ENCOUNTER — Other Ambulatory Visit: Payer: Self-pay

## 2019-01-22 ENCOUNTER — Encounter (HOSPITAL_COMMUNITY): Payer: Self-pay | Admitting: *Deleted

## 2019-01-22 ENCOUNTER — Emergency Department (HOSPITAL_COMMUNITY)
Admission: EM | Admit: 2019-01-22 | Discharge: 2019-01-22 | Disposition: A | Discharge status: Home / Self Care | Payer: Medicaid Other | Attending: Emergency Medicine | Admitting: Emergency Medicine

## 2019-01-22 ENCOUNTER — Telehealth: Payer: Self-pay | Admitting: Family Medicine

## 2019-01-22 ENCOUNTER — Emergency Department (HOSPITAL_COMMUNITY): Payer: Medicaid Other

## 2019-01-22 DIAGNOSIS — F17210 Nicotine dependence, cigarettes, uncomplicated: Secondary | ICD-10-CM | POA: Insufficient documentation

## 2019-01-22 DIAGNOSIS — R0789 Other chest pain: Secondary | ICD-10-CM | POA: Insufficient documentation

## 2019-01-22 DIAGNOSIS — Z79899 Other long term (current) drug therapy: Secondary | ICD-10-CM | POA: Insufficient documentation

## 2019-01-22 DIAGNOSIS — R079 Chest pain, unspecified: Secondary | ICD-10-CM | POA: Diagnosis present

## 2019-01-22 DIAGNOSIS — I10 Essential (primary) hypertension: Secondary | ICD-10-CM | POA: Insufficient documentation

## 2019-01-22 DIAGNOSIS — E039 Hypothyroidism, unspecified: Secondary | ICD-10-CM | POA: Diagnosis not present

## 2019-01-22 LAB — BASIC METABOLIC PANEL
Anion gap: 9 (ref 5–15)
BUN: 12 mg/dL (ref 6–20)
CO2: 28 mmol/L (ref 22–32)
Calcium: 8.9 mg/dL (ref 8.9–10.3)
Chloride: 99 mmol/L (ref 98–111)
Creatinine, Ser: 0.76 mg/dL (ref 0.44–1.00)
GFR calc Af Amer: >60 mL/min (ref >60)
GFR calc non Af Amer: >60 mL/min (ref >60)
Glucose, Bld: 86 mg/dL (ref 70–99)
Potassium: 4 mmol/L (ref 3.5–5.1)
Sodium: 136 mmol/L (ref 135–145)

## 2019-01-22 LAB — TROPONIN I (HIGH SENSITIVITY)
Troponin I (High Sensitivity): <2 ng/L (ref <18)
Troponin I (High Sensitivity): <2 ng/L (ref <18)

## 2019-01-22 LAB — CBC
HCT: 46.7 % — ABNORMAL HIGH (ref 36.0–46.0)
Hemoglobin: 15.1 g/dL — ABNORMAL HIGH (ref 12.0–15.0)
MCH: 30.4 pg (ref 26.0–34.0)
MCHC: 32.3 g/dL (ref 30.0–36.0)
MCV: 94 fL (ref 80.0–100.0)
Platelets: 225 10*3/uL (ref 150–400)
RBC: 4.97 MIL/uL (ref 3.87–5.11)
RDW: 12.6 % (ref 11.5–15.5)
WBC: 10.3 10*3/uL (ref 4.0–10.5)
nRBC: 0 % (ref 0.0–0.2)

## 2019-01-22 LAB — RAPID URINE DRUG SCREEN, HOSP PERFORMED
Amphetamines: NOT DETECTED
Barbiturates: NOT DETECTED
Benzodiazepines: NOT DETECTED
Cocaine: NOT DETECTED
Opiates: NOT DETECTED
Tetrahydrocannabinol: POSITIVE — AB

## 2019-01-22 LAB — D-DIMER, QUANTITATIVE: D-Dimer, Quant: <0.27 ug/mL-FEU (ref 0.00–0.50)

## 2019-01-22 MED ORDER — LORAZEPAM 1 MG PO TABS
1.0000 mg | ORAL_TABLET | Freq: Once | ORAL | Status: AC
  Administered 2019-01-22: 07:00:00 1 mg via ORAL
  Filled 2019-01-22: qty 1

## 2019-01-22 MED ORDER — SODIUM CHLORIDE 0.9% FLUSH: 3.0000 mL | Freq: Once | INTRAVENOUS | Status: DC

## 2019-01-22 MED ORDER — ALUM & MAG HYDROXIDE-SIMETH 200-200-20 MG/5ML PO SUSP
30.0000 mL | Freq: Once | ORAL | Status: AC
  Administered 2019-01-22: 30 mL via ORAL
  Filled 2019-01-22: qty 30

## 2019-01-22 MED ORDER — ALBUTEROL SULFATE HFA 108 (90 BASE) MCG/ACT IN AERS
4.0000 | INHALATION_SPRAY | Freq: Once | RESPIRATORY_TRACT | Status: AC
  Administered 2019-01-22: 4 via RESPIRATORY_TRACT
  Filled 2019-01-22: qty 6.7

## 2019-01-22 MED ORDER — LIDOCAINE VISCOUS HCL 2 % MT SOLN
15.0000 mL | Freq: Once | OROMUCOSAL | Status: AC
  Administered 2019-01-22: 15 mL via ORAL
  Filled 2019-01-22: qty 15

## 2019-01-22 NOTE — Discharge Instructions (Addendum)
There is no evidence of heart attack or blood clot in the lung.  Follow-up with your primary doctor.  You should also follow-up with a cardiologist for repeat stress test.  Return to the ED if you develop new or worsening symptoms including exertional chest pain, shortness of breath, vomiting, sweating or any other concerns.

## 2019-01-22 NOTE — ED Provider Notes (Signed)
[facility] EMERGENCY DEPARTMENT Provider Note   CSN: [id number] Arrival date & time: 01/22/19  0111     History   Chief Complaint Chief Complaint  Patient presents with   Chest Pain    HPI Pam Chen is a 48 y.o. female.     Patient with history of acid reflux disease, kidney stones, anxiety, hypertension, previous substance abuse presenting with episode of chest pain that onset around 10:30 PM.  States she was sitting resting when she had a sudden onset of a "dizzy spell" associated lightheadedness and room spinning dizziness.  This lasted for several minutes and then resolved.  She has had vertigo in the past and this feels similar.  Shortly after that she developed central chest pain going to her left arm and neck.  This pain is been constant since.  Is somewhat worse with palpation.  There was some associated shortness of breath but no nausea or vomiting.  She does have a cough and has been treated for bronchitis with Z-Pak and prednisone.  She has 2 more days of prednisone left.  She had a negative coronavirus test.  She reports her chest pain is improving.  She is wonders if this was an anxiety attack.  She denies any abdominal pain or back pain.  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  States she has been clean for 92 days.  Denies any recent cocaine use.  States she had a negative stress test in 2017.  No headache or dizziness currently.  No focal weakness, numbness or tingling.  The history is provided by the patient.  Chest Pain Associated symptoms: cough, dizziness and shortness of breath   Associated symptoms: no abdominal pain, no back pain, no fever, no nausea and no vomiting     Past Medical History:  Diagnosis Date   Arthritis    Diverticulitis    GERD (gastroesophageal reflux disease)    Hypertension    Kidney stones    Migraine    Rotator cuff injury    Sleep apnea    CPAP   Tennis elbow    Thyroid disease      Patient Active Problem List   Diagnosis Date Noted   Hyperlipidemia LDL goal <130 05/01/2017   Current smoker 03/07/2017   Obesity (BMI 30.0-34.9) 02/02/2017   Hypothyroid 02/02/2017   GERD (gastroesophageal reflux disease) 02/02/2017   Migraine 02/02/2017   Bipolar 1 disorder, depressed ([place]) 02/02/2017    Past Surgical History:  Procedure Laterality Date   ABDOMINAL HYSTERECTOMY     COLON SURGERY     diverticulitis   LITHOTRIPSY     SLEEVE GASTROPLASTY       OB History   No obstetric history on file.      Home Medications    Prior to Admission medications   Medication Sig Start Date End Date Taking? Authorizing Provider  azithromycin (ZITHROMAX) 250 MG tablet Take 500 mg once, then 250 mg for four days 01/17/19   Evelina Dun A, FNP  benzonatate (TESSALON) 200 MG capsule Take 1 capsule (200 mg total) by mouth 3 (three) times daily as needed. 01/17/19   Evelina Dun A, FNP  Cholecalciferol (HM VITAMIN D3) 4000 units CAPS Take 4,000 Units by mouth daily.    [provider]  FLUoxetine (PROZAC) 40 MG capsule Take 1 capsule (40 mg total) by mouth daily. 07/22/18   [name], Fransisca Kaufmann, MD  furosemide (LASIX) 20 MG tablet Take 1 tablet (20 mg  total) by mouth daily. 04/24/18   [name], Elige RadonJoshua A, MD  Galcanezumab-gnlm 120 MG/ML SOAJ INJECT 2ML (240MG ) FOR FIRST DOSE. THEN INECT 1ML (120MG ) ONCE EVERY 30 DAYS 07/05/18   [name], Elige RadonJoshua A, MD  hydrOXYzine (ATARAX/VISTARIL) 50 MG tablet Take 1 tablet (50 mg total) by mouth 3 (three) times daily as needed. 10/30/18   [name], Elige RadonJoshua A, MD  Lactulose 20 GM/30ML SOLN Take 30 mLs (20 g total) by mouth 3 (three) times daily as needed. 03/29/18   Johna SheriffVincent, Carol L, MD  levothyroxine (SYNTHROID) 75 MCG tablet Take 1 tablet (75 mcg total) by mouth daily. 10/30/18   [name], Elige RadonJoshua A, MD  linaclotide Karlene Einstein(LINZESS) 290 MCG CAPS capsule Take 1 capsule (290 mcg total) by mouth daily before breakfast. 05/14/18   Johna SheriffVincent, Carol  L, MD  Multiple Vitamin (MULTIVITAMIN WITH MINERALS) TABS tablet Take 1 tablet by mouth daily.    [provider]  ondansetron (ZOFRAN ODT) 4 MG disintegrating tablet Take 1 tablet (4 mg total) by mouth every 8 (eight) hours as needed for nausea or vomiting. 10/30/18   [name], Elige RadonJoshua A, MD  predniSONE (STERAPRED UNI-PAK 21 TAB) 10 MG (21) TBPK tablet Use as directed 01/17/19   Jannifer RodneyHawks, Christy A, FNP  propranolol ER (INDERAL LA) 120 MG 24 hr capsule Take 1 capsule (120 mg total) by mouth at bedtime. 10/30/18   [name], Elige RadonJoshua A, MD  rizatriptan (MAXALT) 10 MG tablet TAKE 1 TABLET BY MOUTH AS NEEDED FOR MIGRAINE. MAY REPEAT IN 2 HOURS IF NEEDED 08/08/18   [name], Elige RadonJoshua A, MD  tamsulosin (FLOMAX) 0.4 MG CAPS capsule Take 1 capsule (0.4 mg total) by mouth daily. 12/06/18   Jannifer RodneyHawks, Christy A, FNP  topiramate (TOPAMAX) 100 MG tablet Take 1 tablet (100 mg total) by mouth 2 (two) times daily. 08/07/17   Levert FeinsteinYan, Yijun, MD    Family History Family History  Problem Relation Age of Onset   Hypertension Father    Sleep apnea Father    Cancer Maternal Grandmother        breast   Alzheimer's disease Maternal Grandmother    Diabetes Maternal Grandfather    Cancer Paternal Grandmother        ovarian   Hypertension Paternal Grandfather    Heart disease Paternal Grandfather    Diabetes Paternal Grandfather    Hyperlipidemia Brother     Social History Social History   Tobacco Use   Smoking status: Current Every Day Smoker    Packs/day: 0.30    Types: Cigarettes   Smokeless tobacco: Never Used   Tobacco comment: down to 3-4 cigarettes per day - trying to stop  Substance Use Topics   Alcohol use: No   Drug use: Not Currently    Comment: currently in drug recovery, sober x 5 months as of 08/02/18     Allergies   Aspirin, Oxycodone-acetaminophen, and Tramadol hcl   Review of Systems Review of Systems  Constitutional: Negative for activity change, appetite change and fever.    HENT: Negative for congestion and rhinorrhea.   Respiratory: Positive for cough, chest tightness and shortness of breath.   Cardiovascular: Positive for chest pain.  Gastrointestinal: Negative for abdominal pain, nausea and vomiting.  Genitourinary: Negative for dysuria and hematuria.  Musculoskeletal: Negative for arthralgias, back pain and myalgias.  Skin: Negative for rash.  Neurological: Positive for dizziness and light-headedness.    all other systems are negative except as noted in the HPI and PMH.    Physical Exam Updated Vital Signs  BP 114/74    Pulse 62    Temp 98.4 F (36.9 C) (Oral)    Resp 19    Ht 5' 7.5" (1.715 m)    Wt 88.9 kg    SpO2 98%    BMI 30.24 kg/m   Physical Exam Vitals signs and nursing note reviewed.  Constitutional:      General: She is not in acute distress.    Appearance: She is well-developed.     Comments: Dry cough  HENT:     Head: Normocephalic and atraumatic.     Nose: Nose normal. No rhinorrhea.     Mouth/Throat:     Mouth: Mucous membranes are moist.     Pharynx: No oropharyngeal exudate.  Eyes:     Conjunctiva/sclera: Conjunctivae normal.     Pupils: Pupils are equal, round, and reactive to light.  Neck:     Musculoskeletal: Normal range of motion and neck supple.     Comments: No meningismus. Cardiovascular:     Rate and Rhythm: Normal rate and regular rhythm.     Heart sounds: Normal heart sounds. No murmur.  Pulmonary:     Effort: Pulmonary effort is normal. No respiratory distress.     Breath sounds: Normal breath sounds.  Chest:     Chest wall: Tenderness present.  Abdominal:     Palpations: Abdomen is soft.     Tenderness: There is no abdominal tenderness. There is no guarding or rebound.  Musculoskeletal: Normal range of motion.        General: No tenderness.  Skin:    General: Skin is warm.     Capillary Refill: Capillary refill takes less than 2 seconds.  Neurological:     General: No focal deficit present.      Mental Status: She is alert and oriented to person, place, and time. Mental status is at baseline.     Cranial Nerves: No cranial nerve deficit.     Motor: No abnormal muscle tone.     Coordination: Coordination normal.     Comments: No ataxia on finger to nose bilaterally. No pronator drift. 5/5 strength throughout. CN 2-12 intact.Equal grip strength. Sensation intact.  No nystagmus.  Psychiatric:        Behavior: Behavior normal.      ED Treatments / Results  Labs (all labs ordered are listed, but only abnormal results are displayed) Labs Reviewed  CBC - Abnormal; Notable for the following components:      Result Value   Hemoglobin 15.1 (*)    HCT 46.7 (*)    All other components within normal limits  RAPID URINE DRUG SCREEN, HOSP PERFORMED - Abnormal; Notable for the following components:   Tetrahydrocannabinol POSITIVE (*)    All other components within normal limits  BASIC METABOLIC PANEL  D-DIMER, QUANTITATIVE (NOT AT [facility])  POC URINE PREG, ED  TROPONIN I (HIGH SENSITIVITY)  TROPONIN I (HIGH SENSITIVITY)    EKG EKG Interpretation  Date/Time:  Wednesday January 22 2019 05:10:07 EDT Ventricular Rate:  56 PR Interval:    QRS Duration: 91 QT Interval:  437 QTC Calculation: 422 R Axis:   -3 Text Interpretation:  Sinus rhythm Low voltage, precordial leads No previous ECGs available Confirmed by Glynn Octave [phone]) on 01/22/2019 5:19:21 AM   Radiology Dg Chest 2 View  Result Date: 01/22/2019 CLINICAL DATA:  Dizziness and shortness of breath EXAM: CHEST - 2 VIEW COMPARISON:  01/10/2019 FINDINGS: The heart size and mediastinal contours are within normal limits.  Both lungs are clear. The visualized skeletal structures are unremarkable. IMPRESSION: Clear lungs. Electronically Signed   By: Deatra RobinsonKevin  Herman M.D.   On: 01/22/2019 02:10    Procedures Procedures (including critical care time)  Medications Ordered in ED Medications  sodium chloride flush (NS) 0.9 %  injection 3 mL (has no administration in time range)  alum & mag hydroxide-simeth (MAALOX/MYLANTA) 200-200-20 MG/5ML suspension 30 mL (has no administration in time range)    And  lidocaine (XYLOCAINE) 2 % viscous mouth solution 15 mL (has no administration in time range)     Initial Impression / Assessment and Plan / ED Course  I have reviewed the triage vital signs and the nursing notes.  Pertinent labs & imaging results that were available during my care of the patient were reviewed by me and considered in my medical decision making (see chart for details).       Central chest pain that onset around 10:30 PM and has been constant since.  Somewhat worse with palpation.  EKG is sinus rhythm with inferior Q waves. No previous EKG.  Lungs clear.  Chest x-ray is negative.  Troponin negative.  Patient given GI cocktail.  Her pain is somewhat reproducible.  Her EKG is sinus rhythm with inferior Q waves, no comparison. Hear score 2-3.  Troponin negative x2 and d-dimer negative.  Equal upper extremity blood pressures.  Low suspicion for PE or aortic dissection. Drug screen negative for cocaine. Chest pain has improved in the ED.  Atypical for ACS. Low suspicion for PE or aortic dissection.   Advised followup with PCP for repeat stress test. Return to the ED with exertional chest pain, associated with SOB, nausea, vomiting, sweating or any other concerns.  ED ECG REPORT   Date: 01/22/2019  Rate: 69  Rhythm: normal sinus rhythm  QRS Axis: left  Intervals: PR prolonged  ST/T Wave abnormalities: normal  Conduction Disutrbances:none  Narrative Interpretation:   Old EKG Reviewed: unchanged  I have personally reviewed the EKG tracing and agree with the computerized printout as noted.   Final Clinical Impressions(s) / ED Diagnoses   Final diagnoses:  Atypical chest pain    ED Discharge Orders    None       Dailah Opperman, Jeannett SeniorStephen, MD 01/22/19 [phone]

## 2019-01-22 NOTE — Telephone Encounter (Signed)
Spoke with pt regarding appt Pt is currently on treatment for bronchitis Virtual visit scheduled Pt notified of appt

## 2019-01-22 NOTE — ED Notes (Signed)
Pt states she finished a zpack today and still has 2 more days of her prednisone; pt states she has been taking those cause her PCP diagnosed her with bronchitis

## 2019-01-22 NOTE — ED Triage Notes (Signed)
Pt c/o chest pain that radiates down her left arm and up to her shoulders; pt states she has not felt well all day and has felt weak with a headache; pt states she took 2 bayer asa with some relief

## 2019-01-24 ENCOUNTER — Ambulatory Visit (INDEPENDENT_AMBULATORY_CARE_PROVIDER_SITE_OTHER): Payer: Medicaid Other | Admitting: Family Medicine

## 2019-01-24 ENCOUNTER — Encounter: Payer: Self-pay | Admitting: Family Medicine

## 2019-01-24 DIAGNOSIS — F410 Panic disorder [episodic paroxysmal anxiety] without agoraphobia: Secondary | ICD-10-CM

## 2019-01-24 MED ORDER — BUSPIRONE HCL 10 MG PO TABS: 10.0000 mg | ORAL_TABLET | Freq: Two times a day (BID) | ORAL | 1 refills | Status: DC

## 2019-01-24 NOTE — Progress Notes (Signed)
   Virtual Visit via telephone Note  I connected with Pam Chen on 01/24/19 at 1143 by  telephone and verified that I am speaking with the correct person using two identifiers. Pam Chen is currently located at home and no other people are currently with her during visit. The provider, Fransisca Kaufmann Jerrell Mangel, MD is located in their office at time of visit.  Call ended at 1157  I discussed the limitations, risks, security and privacy concerns of performing an evaluation and management service by telephone and the availability of in person appointments. I also discussed with the patient that there may be a patient responsible charge related to this service. The patient expressed understanding and agreed to proceed.   History and Present Illness: Still has some congestion cough and wheezing and was seen virtually 2 weeks ago.  She had ED visit 2 days ago for chest pain and said it came back as a panic attack.  She had a dizzy spell and panic attack.  She has rested the past 2 days and has been more relaxed.  She is waiting on hearing back from 2 different jobs.    No diagnosis found.    Review of Systems  Constitutional: Negative for chills and fever.  HENT: Positive for congestion.   Eyes: Negative for visual disturbance.  Respiratory: Positive for cough. Negative for chest tightness, shortness of breath and wheezing.   Cardiovascular: Negative for chest pain and leg swelling.  Musculoskeletal: Negative for back pain and gait problem.  Skin: Negative for rash.  Neurological: Positive for dizziness. Negative for light-headedness and headaches.  Psychiatric/Behavioral: Positive for decreased concentration. Negative for agitation, behavioral problems, dysphoric mood, self-injury, sleep disturbance and suicidal ideas. The patient is nervous/anxious.   All other systems reviewed and are negative.   Observations/Objective: Patient sounds comfortable and in no acute distress Assessment and  Plan: Problem List Items Addressed This Visit    None    Visit Diagnoses    Panic attacks    -  Primary   Relevant Medications   busPIRone (BUSPAR) 10 MG tablet       Follow Up Instructions: As needed    I discussed the assessment and treatment plan with the patient. The patient was provided an opportunity to ask questions and all were answered. The patient agreed with the plan and demonstrated an understanding of the instructions.   The patient was advised to call back or seek an in-person evaluation if the symptoms worsen or if the condition fails to improve as anticipated.  The above assessment and management plan was discussed with the patient. The patient verbalized understanding of and has agreed to the management plan. Patient is aware to call the clinic if symptoms persist or worsen. Patient is aware when to return to the clinic for a follow-up visit. Patient educated on when it is appropriate to go to the emergency department.    I provided 14 minutes of non-face-to-face time during this encounter.    Worthy Rancher, MD

## 2019-02-05 ENCOUNTER — Other Ambulatory Visit: Payer: Self-pay | Admitting: Family Medicine

## 2019-02-05 DIAGNOSIS — F419 Anxiety disorder, unspecified: Secondary | ICD-10-CM

## 2019-02-05 MED ORDER — FLUOXETINE HCL 40 MG PO CAPS: 40.0000 mg | ORAL_CAPSULE | Freq: Every day | ORAL | 3 refills | Status: DC

## 2019-02-06 ENCOUNTER — Other Ambulatory Visit: Payer: Self-pay | Admitting: Family Medicine

## 2019-02-10 ENCOUNTER — Ambulatory Visit (INDEPENDENT_AMBULATORY_CARE_PROVIDER_SITE_OTHER): Payer: Medicaid Other | Admitting: Family

## 2019-02-10 ENCOUNTER — Encounter: Payer: Self-pay | Admitting: Family

## 2019-02-10 ENCOUNTER — Other Ambulatory Visit: Payer: Self-pay

## 2019-02-10 VITALS — BP 114/66 | HR 61 | Temp 97.3°F | Resp 16 | Ht 67.5 in | Wt 205.0 lb

## 2019-02-10 DIAGNOSIS — Z23 Encounter for immunization: Secondary | ICD-10-CM

## 2019-02-10 DIAGNOSIS — Z0001 Encounter for general adult medical examination with abnormal findings: Secondary | ICD-10-CM | POA: Diagnosis not present

## 2019-02-10 DIAGNOSIS — F17200 Nicotine dependence, unspecified, uncomplicated: Secondary | ICD-10-CM

## 2019-02-10 DIAGNOSIS — E669 Obesity, unspecified: Secondary | ICD-10-CM

## 2019-02-10 DIAGNOSIS — E039 Hypothyroidism, unspecified: Secondary | ICD-10-CM

## 2019-02-10 DIAGNOSIS — E66811 Obesity, class 1: Secondary | ICD-10-CM

## 2019-02-10 DIAGNOSIS — E785 Hyperlipidemia, unspecified: Secondary | ICD-10-CM

## 2019-02-10 DIAGNOSIS — Z01419 Encounter for gynecological examination (general) (routine) without abnormal findings: Secondary | ICD-10-CM

## 2019-02-10 DIAGNOSIS — Z0000 Encounter for general adult medical examination without abnormal findings: Secondary | ICD-10-CM

## 2019-02-10 DIAGNOSIS — Z202 Contact with and (suspected) exposure to infections with a predominantly sexual mode of transmission: Secondary | ICD-10-CM

## 2019-02-10 DIAGNOSIS — G43009 Migraine without aura, not intractable, without status migrainosus: Secondary | ICD-10-CM

## 2019-02-10 DIAGNOSIS — K219 Gastro-esophageal reflux disease without esophagitis: Secondary | ICD-10-CM

## 2019-02-10 DIAGNOSIS — F319 Bipolar disorder, unspecified: Secondary | ICD-10-CM

## 2019-02-10 LAB — URINALYSIS, COMPLETE
Bilirubin, UA: NEGATIVE
Glucose, UA: NEGATIVE
Ketones, UA: NEGATIVE
Leukocytes,UA: NEGATIVE
Nitrite, UA: NEGATIVE
RBC, UA: NEGATIVE
Specific Gravity, UA: >1.03 — ABNORMAL HIGH (ref 1.005–1.030)
Urobilinogen, Ur: 0.2 mg/dL (ref 0.2–1.0)
pH, UA: 5.5 (ref 5.0–7.5)

## 2019-02-10 LAB — MICROSCOPIC EXAMINATION
Epithelial Cells (non renal): >10 /hpf — AB (ref 0–10)
Renal Epithel, UA: NONE SEEN /hpf

## 2019-02-10 NOTE — Patient Instructions (Signed)
Earwax Buildup, Adult The ears produce a substance called earwax that helps keep bacteria out of the ear and protects the skin in the ear canal. Occasionally, earwax can build up in the ear and cause discomfort or hearing loss. What increases the risk? This condition is more likely to develop in people who:  Are female.  Are elderly.  Naturally produce more earwax.  Clean their ears often with cotton swabs.  Use earplugs often.  Use in-ear headphones often.  Wear hearing aids.  Have narrow ear canals.  Have earwax that is overly thick or sticky.  Have eczema.  Are dehydrated.  Have excess hair in the ear canal. What are the signs or symptoms? Symptoms of this condition include:  Reduced or muffled hearing.  A feeling of fullness in the ear or feeling that the ear is plugged.  Fluid coming from the ear.  Ear pain.  Ear itch.  Ringing in the ear.  Coughing.  An obvious piece of earwax that can be seen inside the ear canal. How is this diagnosed? This condition may be diagnosed based on:  Your symptoms.  Your medical history.  An ear exam. During the exam, your health care provider will look into your ear with an instrument called an otoscope. You may have tests, including a hearing test. How is this treated? This condition may be treated by:  Using ear drops to soften the earwax.  Having the earwax removed by a health care provider. The health care provider may: ? Flush the ear with water. ? Use an instrument that has a loop on the end (curette). ? Use a suction device.  Surgery to remove the wax buildup. This may be done in severe cases. Follow these instructions at home:   Take over-the-counter and prescription medicines only as told by your health care provider.  Do not put any objects, including cotton swabs, into your ear. You can clean the opening of your ear canal with a washcloth or facial tissue.  Follow instructions from your health care  provider about cleaning your ears. Do not over-clean your ears.  Drink enough fluid to keep your urine clear or pale yellow. This will help to thin the earwax.  Keep all follow-up visits as told by your health care provider. If earwax builds up in your ears often or if you use hearing aids, consider seeing your health care provider for routine, preventive ear cleanings. Ask your health care provider how often you should schedule your cleanings.  If you have hearing aids, clean them according to instructions from the manufacturer and your health care provider. Contact a health care provider if:  You have ear pain.  You develop a fever.  You have blood, pus, or other fluid coming from your ear.  You have hearing loss.  You have ringing in your ears that does not go away.  Your symptoms do not improve with treatment.  You feel like the room is spinning (vertigo). Summary  Earwax can build up in the ear and cause discomfort or hearing loss.  The most common symptoms of this condition include reduced or muffled hearing and a feeling of fullness in the ear or feeling that the ear is plugged.  This condition may be diagnosed based on your symptoms, your medical history, and an ear exam.  This condition may be treated by using ear drops to soften the earwax or by having the earwax removed by a health care provider.  Do not put any   objects, including cotton swabs, into your ear. You can clean the opening of your ear canal with a washcloth or facial tissue. This information is not intended to replace advice given to you by your health care provider. Make sure you discuss any questions you have with your health care provider. Document Released: 06/22/2004 Document Revised: 04/27/2017 Document Reviewed: 07/26/2016 Elsevier Patient Education  2020 [employer]  

## 2019-02-10 NOTE — Progress Notes (Addendum)
Subjective:    Patient ID: Pam Chen, female    DOB: 01-Apr-1971, 48 y.o.   MRN: [id number]  Chief Complaint  Patient presents with  . physical with pap   Pt presents to the office today for CPE with pap. Pt is followed by Neurologists annually for migraines. Stable. 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   She is also requesting STI testing. She states she has been with the same guy for 15 years, but has recently heard stories about him possibly cheating.  Migraine  This is a chronic problem. The current episode started more than 1 year ago. Episode frequency: weekly. The problem has been waxing and waning. The pain is located in the occipital region. The pain quality is similar to prior headaches. Associated symptoms include insomnia, nausea, phonophobia, photophobia and vomiting. The symptoms are aggravated by emotional stress. The treatment provided moderate relief. Her past medical history is significant for migraine headaches.  Thyroid Problem Presents for follow-up visit. Symptoms include anxiety, constipation, depressed mood and fatigue. Patient reports no diaphoresis or dry skin. The symptoms have been stable.  Depression        This is a chronic problem.  The current episode started more than 1 year ago.   The onset quality is gradual.   The problem occurs intermittently.  The problem has been waxing and waning since onset.  Associated symptoms include decreased concentration, fatigue, hopelessness, insomnia, irritable and restlessness.  Past treatments include SNRIs - Serotonin and norepinephrine reuptake inhibitors.  Past medical history includes thyroid problem and anxiety.   Anxiety Presents for follow-up visit. Symptoms include decreased concentration, depressed mood, excessive worry, insomnia, nausea, nervous/anxious behavior, panic and restlessness. Symptoms occur most days. The severity of symptoms is moderate.    Constipation This is a chronic problem.  The current episode started more than 1 year ago. The problem has been waxing and waning since onset. Her stool frequency is 4 to 5 times per week. Associated symptoms include nausea and vomiting. She has tried laxatives for the symptoms. The treatment provided moderate relief.      Review of Systems  Constitutional: Positive for fatigue. Negative for diaphoresis.  Eyes: Positive for photophobia.  Gastrointestinal: Positive for constipation, nausea and vomiting.  Psychiatric/Behavioral: Positive for decreased concentration and depression. The patient is nervous/anxious and has insomnia.   All other systems reviewed and are negative.  Family History  Problem Relation Age of Onset  . Hypertension Father   . Sleep apnea Father   . Cancer Maternal Grandmother        breast  . Alzheimer's disease Maternal Grandmother   . Diabetes Maternal Grandfather   . Cancer Paternal Grandmother        ovarian  . Hypertension Paternal Grandfather   . Heart disease Paternal Grandfather   . Diabetes Paternal Grandfather   . Hyperlipidemia Brother    Social History   Socioeconomic History  . Marital status: Single    Spouse name: Not on file  . Number of children: Not on file  . Years of education: Not on file  . Highest education level: Not on file  Occupational History  . Not on file  Social Needs  . Financial resource strain: Not on file  . Food insecurity    Worry: Not on file    Inability: Not on file  . Transportation needs    Medical: Not on file    Non-medical: Not on file  Tobacco  Use  . Smoking status: Current Every Day Smoker    Packs/day: 0.30    Types: Cigarettes  . Smokeless tobacco: Never Used  . Tobacco comment: down to 3-4 cigarettes per day - trying to stop  Substance and Sexual Activity  . Alcohol use: No  . 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 Sexual activity: Not on file  Lifestyle  . Physical activity     Days per week: Not on file    Minutes per session: Not on file  . Stress: Not on file  Relationships  . Social Herbalist on phone: Not on file    Gets together: Not on file    Attends religious service: Not on file    Active member of club or organization: Not on file    Attends meetings of clubs or organizations: Not on file    Relationship status: Not on file  Other Topics Concern  . Not on file  Social History Narrative   Lives at home with parents and son.   Right-handed.   1-2 cups caffeine daily.       Objective:   Physical Exam Vitals signs reviewed.  Constitutional:      General: She is irritable. She is not in acute distress.    Appearance: She is well-developed.  HENT:     Head: Normocephalic and atraumatic.     Right Ear: Tympanic membrane normal. There is impacted cerumen.     Left Ear: Tympanic membrane normal.  Eyes:     Pupils: Pupils are equal, round, and reactive to light.  Neck:     Musculoskeletal: Normal range of motion and neck supple.     Thyroid: No thyromegaly.  Cardiovascular:     Rate and Rhythm: Normal rate and regular rhythm.     Heart sounds: Normal heart sounds. No murmur.  Pulmonary:     Effort: Pulmonary effort is normal. No respiratory distress.     Breath sounds: Normal breath sounds. No wheezing.  Chest:     Breasts:        Right: No swelling, bleeding, inverted nipple, mass, nipple discharge, skin change or tenderness.        Left: No swelling, bleeding, inverted nipple, mass, nipple discharge, skin change or tenderness.  Abdominal:     General: Bowel sounds are normal. There is no distension.     Palpations: Abdomen is soft.     Tenderness: There is no abdominal tenderness.  Genitourinary:    General: Normal vulva.     Comments: Bimanual exam- no adnexal masses or tenderness, ovaries nonpalpable   Cervix parous and pink- No discharge  Musculoskeletal: Normal range of motion.        General: No tenderness.   Skin:    General: Skin is warm and dry.  Neurological:     Mental Status: She is alert and oriented to person, place, and time.     Cranial Nerves: No cranial nerve deficit.     Deep Tendon Reflexes: Reflexes are normal and symmetric.  Psychiatric:        Behavior: Behavior normal.        Thought Content: Thought content normal.        Judgment: Judgment normal.       BP 114/66   Pulse 61   Temp (!) 97.3 F (36.3 C) (Temporal)   Resp 16   Ht 5'  7.5" (1.715 m)   Wt 205 lb (93 kg)   SpO2 99%   BMI 31.63 kg/m      Assessment & Plan:  Elanore Talcott comes in today with chief complaint of physical with pap   Diagnosis and orders addressed:  1. Gastroesophageal reflux disease without esophagitis - CMP14+EGFR - CBC with Differential/Platelet  2. Migraine without aura and without status migrainosus, not intractable - CMP14+EGFR - CBC with Differential/Platelet  3. Acquired hypothyroidism - CMP14+EGFR - CBC with Differential/Platelet - TSH  4. Obesity (BMI 30.0-34.9) - CMP14+EGFR - CBC with Differential/Platelet  5. Hyperlipidemia LDL goal <130 - CMP14+EGFR - CBC with Differential/Platelet - Lipid panel  6. Current smoker - CMP14+EGFR - CBC with Differential/Platelet  7. Bipolar 1 disorder, depressed ([place]) - CMP14+EGFR - CBC with Differential/Platelet  8. Annual physical exam - Urinalysis, Complete - CMP14+EGFR - CBC with Differential/Platelet - Lipid panel - TSH - IGP, Aptima HPV, rfx 16/18,45 - STD Screen (8) - Chlamydia/Gonococcus/Trichomonas, NAA  9. Encounter for gynecological examination without abnormal finding - CMP14+EGFR - CBC with Differential/Platelet - IGP, Aptima HPV, rfx 16/18,45  10. Possible exposure to STD - CMP14+EGFR - CBC with Differential/Platelet - STD Screen (8) - Chlamydia/Gonococcus/Trichomonas, NAA  11. Need for immunization against influenza - Flu Vaccine QUAD 36+ mos IM   Labs pending Health Maintenance  reviewed Diet and exercise encouraged  Follow up plan: Keep follow up with PCP   Evelina Dun, FNP

## 2019-02-11 LAB — TSH: TSH: 2.4 u[IU]/mL (ref 0.450–4.500)

## 2019-02-11 LAB — CMP14+EGFR
ALT: 11 IU/L (ref 0–32)
AST: 10 IU/L (ref 0–40)
Albumin/Globulin Ratio: 1.8 (ref 1.2–2.2)
Albumin: 4 g/dL (ref 3.8–4.8)
Alkaline Phosphatase: 75 IU/L (ref 39–117)
BUN/Creatinine Ratio: 13 (ref 9–23)
BUN: 11 mg/dL (ref 6–24)
Bilirubin Total: 0.3 mg/dL (ref 0.0–1.2)
CO2: 26 mmol/L (ref 20–29)
Calcium: 9.3 mg/dL (ref 8.7–10.2)
Chloride: 100 mmol/L (ref 96–106)
Creatinine, Ser: 0.88 mg/dL (ref 0.57–1.00)
GFR calc Af Amer: 91 mL/min/{1.73_m2} (ref >59)
GFR calc non Af Amer: 79 mL/min/{1.73_m2} (ref >59)
Globulin, Total: 2.2 g/dL (ref 1.5–4.5)
Glucose: 79 mg/dL (ref 65–99)
Potassium: 4.4 mmol/L (ref 3.5–5.2)
Sodium: 138 mmol/L (ref 134–144)
Total Protein: 6.2 g/dL (ref 6.0–8.5)

## 2019-02-11 LAB — CBC WITH DIFFERENTIAL/PLATELET
Basophils Absolute: 0 10*3/uL (ref 0.0–0.2)
Basos: 1 %
EOS (ABSOLUTE): 0.1 10*3/uL (ref 0.0–0.4)
Eos: 2 %
Hematocrit: 42.3 % (ref 34.0–46.6)
Hemoglobin: 14.6 g/dL (ref 11.1–15.9)
Immature Grans (Abs): 0 10*3/uL (ref 0.0–0.1)
Immature Granulocytes: 0 %
Lymphocytes Absolute: 2.4 10*3/uL (ref 0.7–3.1)
Lymphs: 29 %
MCH: 30.4 pg (ref 26.6–33.0)
MCHC: 34.5 g/dL (ref 31.5–35.7)
MCV: 88 fL (ref 79–97)
Monocytes Absolute: 0.6 10*3/uL (ref 0.1–0.9)
Monocytes: 7 %
Neutrophils Absolute: 5 10*3/uL (ref 1.4–7.0)
Neutrophils: 61 %
Platelets: 248 10*3/uL (ref 150–450)
RBC: 4.8 x10E6/uL (ref 3.77–5.28)
RDW: 12.2 % (ref 11.7–15.4)
WBC: 8.2 10*3/uL (ref 3.4–10.8)

## 2019-02-11 LAB — LIPID PANEL
Chol/HDL Ratio: 4.1 ratio (ref 0.0–4.4)
Cholesterol, Total: 195 mg/dL (ref 100–199)
HDL: 48 mg/dL (ref >39)
LDL Chol Calc (NIH): 132 mg/dL — ABNORMAL HIGH (ref 0–99)
Triglycerides: 83 mg/dL (ref 0–149)
VLDL Cholesterol Cal: 15 mg/dL (ref 5–40)

## 2019-02-11 LAB — STD SCREEN (8)
HIV Screen 4th Generation wRfx: NONREACTIVE
HSV 1 Glycoprotein G Ab, IgG: 1.77 index — ABNORMAL HIGH (ref 0.00–0.90)
HSV 2 IgG, Type Spec: 16.2 index — ABNORMAL HIGH (ref 0.00–0.90)
Hep A IgM: NEGATIVE
Hep B C IgM: NEGATIVE
Hep C Virus Ab: <0.1 s/co ratio (ref 0.0–0.9)
Hepatitis B Surface Ag: NEGATIVE
RPR Ser Ql: NONREACTIVE

## 2019-02-12 LAB — CHLAMYDIA/GONOCOCCUS/TRICHOMONAS, NAA
Chlamydia by NAA: NEGATIVE
Gonococcus by NAA: NEGATIVE
Trich vag by NAA: NEGATIVE

## 2019-02-13 ENCOUNTER — Other Ambulatory Visit: Payer: Self-pay | Admitting: Family

## 2019-02-13 DIAGNOSIS — B009 Herpesviral infection, unspecified: Secondary | ICD-10-CM

## 2019-02-13 HISTORY — DX: Herpesviral infection, unspecified: B00.9

## 2019-02-13 LAB — IGP, APTIMA HPV, RFX 16/18,45: HPV Aptima: NEGATIVE

## 2019-02-14 ENCOUNTER — Encounter: Payer: Self-pay | Admitting: Family Medicine

## 2019-02-14 ENCOUNTER — Other Ambulatory Visit: Payer: Self-pay | Admitting: Family

## 2019-02-14 DIAGNOSIS — G8929 Other chronic pain: Secondary | ICD-10-CM

## 2019-02-14 MED ORDER — VALACYCLOVIR HCL 1 G PO TABS: 2000.0000 mg | ORAL_TABLET | Freq: Two times a day (BID) | ORAL | 2 refills | Status: AC

## 2019-02-19 ENCOUNTER — Ambulatory Visit (INDEPENDENT_AMBULATORY_CARE_PROVIDER_SITE_OTHER): Payer: Medicaid Other | Admitting: Family Medicine

## 2019-02-19 ENCOUNTER — Encounter: Payer: Self-pay | Admitting: Family Medicine

## 2019-02-19 ENCOUNTER — Other Ambulatory Visit: Payer: Self-pay | Admitting: Family Medicine

## 2019-02-19 DIAGNOSIS — G43009 Migraine without aura, not intractable, without status migrainosus: Secondary | ICD-10-CM | POA: Diagnosis not present

## 2019-02-19 MED ORDER — RIZATRIPTAN BENZOATE 10 MG PO TABS: ORAL_TABLET | ORAL | 2 refills | Status: DC

## 2019-02-19 NOTE — Progress Notes (Signed)
Virtual Visit via Telephone Note  I connected with Pam Chen on 02/19/19 at 2:47 PM by telephone and verified that I am speaking with the correct person using two identifiers. Pam Chen is currently located at home and nobody is currently with her during this visit. The provider, Loman Brooklyn, FNP is located in their home at time of visit.  I discussed the limitations, risks, security and privacy concerns of performing an evaluation and management service by telephone and the availability of in person appointments. I also discussed with the patient that there may be a patient responsible charge related to this service. The patient expressed understanding and agreed to proceed.  Subjective: PCP: [name], Fransisca Kaufmann, MD  Chief Complaint  Patient presents with  . Migraine   Pam Chen is a 48 y.o. female who complains of migraine headache that started this morning around 10:30 AM. She has a well established history of recurrent migraines. Patient has already taken Ibuprofen for this headache without relief. She is currently out of her prescription for Maxalt and has no refills left.   There are no associated abnormal neurological symptoms such as TIA's, loss of balance, loss of vision or speech, numbness or weakness on review. Past neurological history: negative for stroke, MS, epilepsy, or brain tumor.    ROS: Per HPI  Current Outpatient Medications:  .  albuterol (VENTOLIN HFA) 108 (90 Base) MCG/ACT inhaler, TAKE 1 2 PUFFS AS NEEDED FOR WHEEZING/CHEST TIGHTNESS, Disp: , Rfl:  .  ARIPiprazole (ABILIFY) 15 MG tablet, Take 15 mg by mouth daily., Disp: , Rfl:  .  busPIRone (BUSPAR) 10 MG tablet, Take 1 tablet (10 mg total) by mouth 2 (two) times daily., Disp: 60 tablet, Rfl: 1 .  Cholecalciferol (HM VITAMIN D3) 4000 units CAPS, Take 4,000 Units by mouth daily., Disp: , Rfl:  .  EMGALITY 120 MG/ML SOAJ, INJECT 2ML (240MG ) FOR FIRST DOSE. THEN INECT 1ML (120MG ) ONCE EVERY 30 DAYS,  Disp: 1 mL, Rfl: 0 .  FLUoxetine HCl 60 MG TABS, Take 60 mg by mouth daily., Disp: , Rfl:  .  levothyroxine (SYNTHROID) 75 MCG tablet, Take 1 tablet (75 mcg total) by mouth daily., Disp: 90 tablet, Rfl: 3 .  linaclotide (LINZESS) 290 MCG CAPS capsule, Take 1 capsule (290 mcg total) by mouth daily before breakfast., Disp: 90 capsule, Rfl: 1 .  Multiple Vitamin (MULTIVITAMIN WITH MINERALS) TABS tablet, Take 1 tablet by mouth daily., Disp: , Rfl:  .  propranolol ER (INDERAL LA) 120 MG 24 hr capsule, Take 1 capsule (120 mg total) by mouth at bedtime., Disp: 90 capsule, Rfl: 3 .  rizatriptan (MAXALT) 10 MG tablet, TAKE 1 TABLET BY MOUTH AS NEEDED FOR MIGRAINE. MAY REPEAT IN 2 HOURS IF NEEDED, Disp: 10 tablet, Rfl: 2 .  topiramate (TOPAMAX) 100 MG tablet, Take 1 tablet (100 mg total) by mouth 2 (two) times daily., Disp: 60 tablet, Rfl: 11  Allergies  Allergen Reactions  . Aspirin Other (See Comments)    Upset tummy.  Can take ibuprofen.  . Oxycodone-Acetaminophen Anxiety    Can take vicodin  . Tramadol Hcl Nausea Only   Past Medical History:  Diagnosis Date  . Arthritis   . Diverticulitis   . GERD (gastroesophageal reflux disease)   . Hypertension   . Kidney stones   . Migraine   . Rotator cuff injury   . Sleep apnea    CPAP  . Tennis elbow   . Thyroid disease  Observations/Objective: A&O  No respiratory distress or wheezing audible over the phone Mood, judgement, and thought processes all WNL  Assessment and Plan: 1. Migraine without aura and without status migrainosus, not intractable - Refill sent for Maxalt. Patient to call office if migraine does not resolve for Toradol injection.  - rizatriptan (MAXALT) 10 MG tablet; TAKE 1 TABLET BY MOUTH AS NEEDED FOR MIGRAINE. MAY REPEAT IN 2 HOURS IF NEEDED  Dispense: 10 tablet; Refill: 2   Follow Up Instructions:  I discussed the assessment and treatment plan with the patient. The patient was provided an opportunity to ask  questions and all were answered. The patient agreed with the plan and demonstrated an understanding of the instructions.   The patient was advised to call back or seek an in-person evaluation if the symptoms worsen or if the condition fails to improve as anticipated.  The above assessment and management plan was discussed with the patient. The patient verbalized understanding of and has agreed to the management plan. Patient is aware to call the clinic if symptoms persist or worsen. Patient is aware when to return to the clinic for a follow-up visit. Patient educated on when it is appropriate to go to the emergency department.   Time call ended: 2:54 PM  I provided 9 minutes of non-face-to-face time during this encounter.  Deliah Boston, MSN, APRN, FNP-C [facility] 02/19/19

## 2019-02-21 ENCOUNTER — Ambulatory Visit: Payer: Medicaid Other | Admitting: Orthopaedic Surgery

## 2019-02-25 ENCOUNTER — Telehealth: Payer: Self-pay | Admitting: Neurology

## 2019-02-25 NOTE — Telephone Encounter (Signed)
Pt called stating that the Migraines have come back and she is needing to see someone soon and can not wait till the next available appt that was offered to her in November. Please advise.

## 2019-02-25 NOTE — Telephone Encounter (Signed)
I called the patient who tells me she has been seeing her PCP for her migraines and he has been managing her medications.  Reports migraines have become more frequent.  States she is going to call her PCP to discuss her medications with him first and will call and let us know if she needs to schedule another appt here.

## 2019-02-27 ENCOUNTER — Ambulatory Visit (INDEPENDENT_AMBULATORY_CARE_PROVIDER_SITE_OTHER): Payer: Medicaid Other | Admitting: Family Medicine

## 2019-02-27 ENCOUNTER — Encounter: Payer: Self-pay | Admitting: Family Medicine

## 2019-02-27 DIAGNOSIS — G43009 Migraine without aura, not intractable, without status migrainosus: Secondary | ICD-10-CM

## 2019-02-27 MED ORDER — RIZATRIPTAN BENZOATE 10 MG PO TABS: ORAL_TABLET | ORAL | 2 refills | Status: DC

## 2019-02-27 MED ORDER — EMGALITY 120 MG/ML ~~LOC~~ SOAJ: SUBCUTANEOUS | 1 refills | Status: AC

## 2019-02-27 MED ORDER — PREDNISONE 20 MG PO TABS: ORAL_TABLET | ORAL | 0 refills | Status: DC

## 2019-02-27 NOTE — Progress Notes (Signed)
Virtual Visit via telephone Note  I connected with Pam Chen on 02/27/19 at 1722 by telephone and verified that I am speaking with the correct person using two identifiers. Pam Chen is currently located at home and no other people are currently with her during visit. The provider, Fransisca Kaufmann Yurika Pereda, MD is located in their office at time of visit.  Call ended at 1735  I discussed the limitations, risks, security and privacy concerns of performing an evaluation and management service by telephone and the availability of in person appointments. I also discussed with the patient that there may be a patient responsible charge related to this service. The patient expressed understanding and agreed to proceed.   History and Present Illness: Patient is using migraines and they are coming back very often and she ran through all of her Maxalt and she is stressed out from job and overwhelmed.  She spoke with neurology and they told her to speak with [name] first. She is still taking emgality and topamax and propranolol. She relapsed and was off medications.  She has had migraines before but is really picking up more frequently recently.  No diagnosis found.  Outpatient Encounter Medications as of 02/27/2019  Medication Sig  . albuterol (VENTOLIN HFA) 108 (90 Base) MCG/ACT inhaler TAKE 1 2 PUFFS AS NEEDED FOR WHEEZING/CHEST TIGHTNESS  . ARIPiprazole (ABILIFY) 15 MG tablet Take 15 mg by mouth daily.  . busPIRone (BUSPAR) 10 MG tablet Take 1 tablet (10 mg total) by mouth 2 (two) times daily.  . Cholecalciferol (HM VITAMIN D3) 4000 units CAPS Take 4,000 Units by mouth daily.  Marland Kitchen EMGALITY 120 MG/ML SOAJ INJECT 2ML (240MG ) FOR FIRST DOSE. THEN INECT 1ML (120MG ) ONCE EVERY 30 DAYS  . FLUoxetine HCl 60 MG TABS Take 60 mg by mouth daily.  Marland Kitchen levothyroxine (SYNTHROID) 75 MCG tablet Take 1 tablet (75 mcg total) by mouth daily.  Marland Kitchen linaclotide (LINZESS) 290 MCG CAPS capsule Take 1 capsule (290 mcg total) by  mouth daily before breakfast.  . Multiple Vitamin (MULTIVITAMIN WITH MINERALS) TABS tablet Take 1 tablet by mouth daily.  . propranolol ER (INDERAL LA) 120 MG 24 hr capsule Take 1 capsule (120 mg total) by mouth at bedtime.  . rizatriptan (MAXALT) 10 MG tablet TAKE 1 TABLET BY MOUTH AS NEEDED FOR MIGRAINE. MAY REPEAT IN 2 HOURS IF NEEDED  . topiramate (TOPAMAX) 100 MG tablet Take 1 tablet (100 mg total) by mouth 2 (two) times daily.   No facility-administered encounter medications on file as of 02/27/2019.     Review of Systems  Constitutional: Negative for chills and fever.  Eyes: Negative for visual disturbance.  Respiratory: Negative for chest tightness and shortness of breath.   Cardiovascular: Negative for chest pain and leg swelling.  Musculoskeletal: Negative for back pain and gait problem.  Skin: Negative for rash.  Neurological: Positive for headaches. Negative for dizziness, weakness, light-headedness and numbness.  Psychiatric/Behavioral: Negative for agitation and behavioral problems.  All other systems reviewed and are negative.   Observations/Objective: Patient sounds comfortable and in no acute distress  Assessment and Plan: Problem List Items Addressed This Visit      Cardiovascular and Mediastinum   Migraine - Primary   Relevant Medications   Galcanezumab-gnlm (EMGALITY) 120 MG/ML SOAJ   rizatriptan (MAXALT) 10 MG tablet       Follow Up Instructions: 3 month fu migraines   Restarted her Emgality with a double dose at first and will see if that helps stop  the current 1  I discussed the assessment and treatment plan with the patient. The patient was provided an opportunity to ask questions and all were answered. The patient agreed with the plan and demonstrated an understanding of the instructions.   The patient was advised to call back or seek an in-person evaluation if the symptoms worsen or if the condition fails to improve as anticipated.  The above  assessment and management plan was discussed with the patient. The patient verbalized understanding of and has agreed to the management plan. Patient is aware to call the clinic if symptoms persist or worsen. Patient is aware when to return to the clinic for a follow-up visit. Patient educated on when it is appropriate to go to the emergency department.    I provided 13 minutes of non-face-to-face time during this encounter.    Nils Pyle, MD

## 2019-03-12 ENCOUNTER — Encounter: Payer: Self-pay | Admitting: Physician Assistant

## 2019-03-12 ENCOUNTER — Ambulatory Visit (INDEPENDENT_AMBULATORY_CARE_PROVIDER_SITE_OTHER): Payer: Medicaid Other | Admitting: Physician Assistant

## 2019-03-12 DIAGNOSIS — R110 Nausea: Secondary | ICD-10-CM

## 2019-03-12 DIAGNOSIS — Z9884 Bariatric surgery status: Secondary | ICD-10-CM

## 2019-03-12 DIAGNOSIS — R1013 Epigastric pain: Secondary | ICD-10-CM

## 2019-03-12 NOTE — Progress Notes (Signed)
306      Telephone visit  Subjective: CC: Abdominal pain severe PCP: [name], Fransisca Kaufmann, MD [id number] Pam Chen is a 48 y.o. female calls for telephone consult today. Patient provides verbal consent for consult held via phone.  Patient is identified with 2 separate identifiers.  At this time the entire area is on COVID-19 social distancing and stay home orders are in place.  Patient is of higher risk and therefore we are performing this by a virtual method.  Location of patient: Home Location of provider: HOME Others present for call: No   This patient is having a phone visit because she was having symptoms similar to code.  However I do not feel that she has a COVID infection whatsoever.  In January 2018 the patient had a gastric sleeve performed.  She really has not had any problems whatsoever with the sleep.  She occasionally will have an upset stomach if she ate something and correct that it would subside.  She is only had a stomach virus that were mild a couple of times.  However this pain is been quite severe over the past 3 days.  The nausea is very severe.  She has tried to sleep through it.  She did have a few times of vomiting yesterday and once this morning.  She denies any diarrhea.  She has had normal bowel movements.  In the past she has had to use some Linzess at times.  However she states that her bowel movements have been very good and regular and occurring every day or at least every other day.  The pain is severe and located over the upper abdomen.  She states it comes and goes but nothing can help that.  We will plan for ultrasound to be ordered to evaluate for this particular given that she has a foreign body in her system.  She has called the surgeon and they are about 3 weeks out in getting patient seen.   ROS: Per HPI  Allergies  Allergen Reactions  . Aspirin Other (See Comments)    Upset tummy.  Can take ibuprofen.  . Oxycodone-Acetaminophen Anxiety    Can take  vicodin  . Tramadol Hcl Nausea Only   Past Medical History:  Diagnosis Date  . Arthritis   . Diverticulitis   . GERD (gastroesophageal reflux disease)   . Hypertension   . Kidney stones   . Migraine   . Rotator cuff injury   . Sleep apnea    CPAP  . Tennis elbow   . Thyroid disease     Current Outpatient Medications:  .  albuterol (VENTOLIN HFA) 108 (90 Base) MCG/ACT inhaler, TAKE 1 2 PUFFS AS NEEDED FOR WHEEZING/CHEST TIGHTNESS, Disp: , Rfl:  .  ARIPiprazole (ABILIFY) 15 MG tablet, Take 15 mg by mouth daily., Disp: , Rfl:  .  busPIRone (BUSPAR) 10 MG tablet, Take 1 tablet (10 mg total) by mouth 2 (two) times daily., Disp: 60 tablet, Rfl: 1 .  Cholecalciferol (HM VITAMIN D3) 4000 units CAPS, Take 4,000 Units by mouth daily., Disp: , Rfl:  .  FLUoxetine HCl 60 MG TABS, Take 60 mg by mouth daily., Disp: , Rfl:  .  levothyroxine (SYNTHROID) 75 MCG tablet, Take 1 tablet (75 mcg total) by mouth daily., Disp: 90 tablet, Rfl: 3 .  linaclotide (LINZESS) 290 MCG CAPS capsule, Take 1 capsule (290 mcg total) by mouth daily before breakfast., Disp: 90 capsule, Rfl: 1 .  Multiple Vitamin (MULTIVITAMIN WITH MINERALS) TABS tablet,  Take 1 tablet by mouth daily., Disp: , Rfl:  .  predniSONE (DELTASONE) 20 MG tablet, 2 po at same time daily for 5 days, Disp: 10 tablet, Rfl: 0 .  propranolol ER (INDERAL LA) 120 MG 24 hr capsule, Take 1 capsule (120 mg total) by mouth at bedtime., Disp: 90 capsule, Rfl: 3 .  rizatriptan (MAXALT) 10 MG tablet, TAKE 1 TABLET BY MOUTH AS NEEDED FOR MIGRAINE. MAY REPEAT IN 2 HOURS IF NEEDED, Disp: 10 tablet, Rfl: 2 .  topiramate (TOPAMAX) 100 MG tablet, Take 1 tablet (100 mg total) by mouth 2 (two) times daily., Disp: 60 tablet, Rfl: 11  Assessment/ Plan: 48 y.o. female   1. Epigastric pain - US Abdomen Complete; Future  2. Nausea - US Abdomen Complete; Future  3. History of gastric restrictive surgery - US Abdomen Complete; Future  Note to be out 10/13 through  03/12/2019.  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 No follow-ups on file.  Continue all other maintenance medications as listed above.  Start time: 3:06 PM End time: 3:20 PM  No orders of the defined types were placed in this encounter.   Prudy Feeler PA-C [facility] [phone]

## 2019-03-13 ENCOUNTER — Telehealth: Payer: Self-pay | Admitting: Physician Assistant

## 2019-03-13 ENCOUNTER — Other Ambulatory Visit: Payer: Self-pay

## 2019-03-13 ENCOUNTER — Ambulatory Visit (HOSPITAL_COMMUNITY)
Admission: RE | Admit: 2019-03-13 | Discharge: 2019-03-13 | Disposition: A | Discharge status: Home / Self Care | Payer: Medicaid Other | Source: Ambulatory Visit | Attending: Physician Assistant | Admitting: Physician Assistant

## 2019-03-13 DIAGNOSIS — R1013 Epigastric pain: Secondary | ICD-10-CM

## 2019-03-13 DIAGNOSIS — R110 Nausea: Secondary | ICD-10-CM | POA: Insufficient documentation

## 2019-03-13 DIAGNOSIS — Z9884 Bariatric surgery status: Secondary | ICD-10-CM

## 2019-03-13 DIAGNOSIS — K760 Fatty (change of) liver, not elsewhere classified: Secondary | ICD-10-CM

## 2019-03-13 NOTE — Telephone Encounter (Signed)
Please review US and advise

## 2019-03-13 NOTE — Telephone Encounter (Signed)
I spoke to patient's father, Richardson Landry, he is on her release for hip.  We discussed the findings of the ultrasound which included stones, fatty liver.  Nothing that appears to be an acute abdomen at this time.  We discussed consideration for evaluation by gastroenterology.  Could consider a surgical referral as well if they felt this was necessary for the gallstones.  All questions were answered.  They will follow-up with PCP if needs GI referral.

## 2019-03-14 NOTE — Telephone Encounter (Signed)
Do I need to do this?

## 2019-03-14 NOTE — Telephone Encounter (Signed)
Are you working on this patient now? I had sent the message to you on Wednesday for the Heads up. Then on Thursday, Dr Lajuana Ripple let her know the results. But there is not a referral in place.

## 2019-03-14 NOTE — Telephone Encounter (Signed)
Called and spoke with pt mom -= she would like referral = placed.   Mom on HIPPA

## 2019-03-14 NOTE — Telephone Encounter (Signed)
We can go ahead and put in referral if patient desires for GI, diagnosis abdominal pain, fatty liver

## 2019-04-08 ENCOUNTER — Other Ambulatory Visit: Payer: Self-pay | Admitting: Family Medicine

## 2019-04-08 DIAGNOSIS — R1013 Epigastric pain: Secondary | ICD-10-CM

## 2019-04-08 NOTE — Progress Notes (Signed)
Placed order for gastroenterology again

## 2019-04-14 ENCOUNTER — Encounter: Payer: Self-pay | Admitting: Internal Medicine

## 2019-04-23 ENCOUNTER — Encounter: Payer: Self-pay | Admitting: Family Medicine

## 2019-04-23 ENCOUNTER — Other Ambulatory Visit: Payer: Self-pay

## 2019-04-23 ENCOUNTER — Ambulatory Visit (INDEPENDENT_AMBULATORY_CARE_PROVIDER_SITE_OTHER): Payer: Medicaid Other | Admitting: Family Medicine

## 2019-04-23 DIAGNOSIS — H66001 Acute suppurative otitis media without spontaneous rupture of ear drum, right ear: Secondary | ICD-10-CM

## 2019-04-23 MED ORDER — AMOXICILLIN-POT CLAVULANATE 875-125 MG PO TABS: 1.0000 | ORAL_TABLET | Freq: Two times a day (BID) | ORAL | 0 refills | Status: DC

## 2019-04-23 NOTE — Progress Notes (Signed)
Subjective:    Patient ID: Pam Chen, female    DOB: [birth year], 48 y.o.   MRN: [id number]   HPI: Pam Chen is a 48 y.o. female presenting for increasing pain in the right ear for three days. Now excruciating. No decrease in hearing. Has responded to augmentin in the past. There are no other symptoms. This includes fever and cough and diarrhea. She is not dyspneic.    Depression screen [facility] 2/9 02/10/2019 10/30/2018 07/22/2018 05/14/2018 04/12/2018  Decreased Interest 0 2 0 1 2  Down, Depressed, Hopeless 0 3 0 1 2  PHQ - 2 Score 0 5 0 2 4  Altered sleeping - 3 2 3 2   Tired, decreased energy - 3 2 2 1   Change in appetite - 3 2 2 2   Feeling bad or failure about yourself  - 3 0 1 3  Trouble concentrating - 3 0 1 1  Moving slowly or fidgety/restless - 1 0 0 1  Suicidal thoughts - 1 0 0 1  PHQ-9 Score - 22 6 11 15   Difficult doing work/chores - - - Somewhat difficult Somewhat difficult  Some recent data might be hidden     Relevant past medical, surgical, family and social history reviewed and updated as indicated.  Interim medical history since our last visit reviewed. Allergies and medications reviewed and updated.  ROS:  Review of Systems  Constitutional: Negative for appetite change, chills, diaphoresis, fatigue and fever.  HENT: Positive for ear pain. Negative for congestion, hearing loss, postnasal drip, rhinorrhea, sore throat and trouble swallowing.   Respiratory: Negative for cough, chest tightness and shortness of breath.   Cardiovascular: Negative for chest pain and palpitations.  Gastrointestinal: Negative for abdominal pain.  Musculoskeletal: Negative for arthralgias.  Skin: Negative for rash.     Social History   Tobacco Use  Smoking Status Current Every Day Smoker  . Packs/day: 0.30  . Types: Cigarettes  Smokeless Tobacco Never Used  Tobacco Comment   down to 3-4 cigarettes per day - trying to stop       Objective:     Wt Readings from Last 3  Encounters:  02/10/19 205 lb (93 kg)  01/22/19 196 lb (88.9 kg)  12/09/18 205 lb 3.2 oz (93.1 kg)     Exam deferred. Pt. Harboring due to COVID 19. Phone visit performed.   Assessment & Plan:   1. Acute suppurative otitis media of right ear without spontaneous rupture of tympanic membrane, recurrence not specified     Meds ordered this encounter  Medications  . amoxicillin-clavulanate (AUGMENTIN) 875-125 MG tablet    Sig: Take 1 tablet by mouth 2 (two) times daily. Take all of this medication    Dispense:  20 tablet    Refill:  0    No orders of the defined types were placed in this encounter.     Diagnoses and all orders for this visit:  Acute suppurative otitis media of right ear without spontaneous rupture of tympanic membrane, recurrence not specified  Other orders -     amoxicillin-clavulanate (AUGMENTIN) 875-125 MG tablet; Take 1 tablet by mouth 2 (two) times daily. Take all of this medication    Virtual Visit via telephone Note  I discussed the limitations, risks, security and privacy concerns of performing an evaluation and management service by telephone and the availability of in person appointments. The patient was identified with two identifiers. Pt.expressed understanding and agreed to proceed. Pt. Is at home. Dr. is  in his office.  Follow Up Instructions:   I discussed the assessment and treatment plan with the patient. The patient was provided an opportunity to ask questions and all were answered. The patient agreed with the plan and demonstrated an understanding of the instructions.   The patient was advised to call back or seek an in-person evaluation if the symptoms worsen or if the condition fails to improve as anticipated.   Total minutes including chart review and phone contact time: 8   Follow up plan: Return if symptoms worsen or fail to improve.  Claretta Fraise, MD [place]

## 2019-04-28 ENCOUNTER — Other Ambulatory Visit: Payer: Self-pay

## 2019-04-28 DIAGNOSIS — Z20822 Contact with and (suspected) exposure to covid-19: Secondary | ICD-10-CM

## 2019-04-30 LAB — NOVEL CORONAVIRUS, NAA: SARS-CoV-2, NAA: NOT DETECTED

## 2019-05-21 ENCOUNTER — Ambulatory Visit: Payer: Medicaid Other | Admitting: Internal Medicine

## 2019-05-22 ENCOUNTER — Other Ambulatory Visit: Payer: Self-pay | Admitting: Family Medicine

## 2019-05-22 DIAGNOSIS — G43009 Migraine without aura, not intractable, without status migrainosus: Secondary | ICD-10-CM

## 2019-05-26 ENCOUNTER — Other Ambulatory Visit: Payer: Self-pay | Admitting: Family Medicine

## 2019-05-27 ENCOUNTER — Other Ambulatory Visit: Payer: Self-pay

## 2019-05-27 ENCOUNTER — Ambulatory Visit (INDEPENDENT_AMBULATORY_CARE_PROVIDER_SITE_OTHER): Payer: Medicaid Other | Admitting: Family Medicine

## 2019-05-27 DIAGNOSIS — J40 Bronchitis, not specified as acute or chronic: Secondary | ICD-10-CM | POA: Diagnosis not present

## 2019-05-27 DIAGNOSIS — Z720 Tobacco use: Secondary | ICD-10-CM | POA: Diagnosis not present

## 2019-05-27 MED ORDER — ALBUTEROL SULFATE HFA 108 (90 BASE) MCG/ACT IN AERS: INHALATION_SPRAY | RESPIRATORY_TRACT | 1 refills | Status: DC

## 2019-05-27 MED ORDER — BENZONATATE 200 MG PO CAPS: 200.0000 mg | ORAL_CAPSULE | Freq: Three times a day (TID) | ORAL | 0 refills | Status: DC | PRN

## 2019-05-27 MED ORDER — DOXYCYCLINE HYCLATE 100 MG PO TABS: 100.0000 mg | ORAL_TABLET | Freq: Two times a day (BID) | ORAL | 0 refills | Status: AC

## 2019-05-27 NOTE — Patient Instructions (Signed)
Prevent the Spread of COVID-19 if You Are Sick If you are sick with COVID-19 or think you might have COVID-19, follow the steps below to help protect other people in your home and community. Stay home except to get medical care.  Stay home. Most people with COVID-19 have mild illness and are able to recover at home without medical care. Do not leave your home, except to get medical care. Do not visit public areas.  Take care of yourself. Get rest and stay hydrated.  Get medical care when needed. Call your doctor before you go to their office for care. But, if you have trouble breathing or other concerning symptoms, call 911 for immediate help.  Avoid public transportation, ride-sharing, or taxis. Separate yourself from other people and pets in your home.  As much as possible, stay in a specific room and away from other people and pets in your home. Also, you should use a separate bathroom, if available. If you need to be around other people or animals in or outside of the home, wear a cloth face covering. ? See COVID-19 and Animals if you have questions about pets: https://www.cdc.gov/coronavirus/2019-ncov/faq.html#COVID19animals Monitor your symptoms.  Common symptoms of COVID-19 include fever and cough. Trouble breathing is a more serious symptom that means you should get medical attention.  Follow care instructions from your healthcare provider and local health department. Your local health authorities will give instructions on checking your symptoms and reporting information. If you develop emergency warning signs for COVID-19 get medical attention immediately.  Emergency warning signs include*:  Trouble breathing  Persistent pain or pressure in the chest  New confusion or not able to be woken  Bluish lips or face *This list is not all inclusive. Please consult your medical provider for any other symptoms that are severe or concerning to you. Call 911 if you have a medical  emergency. If you have a medical emergency and need to call 911, notify the operator that you have or think you might have, COVID-19. If possible, put on a facemask before medical help arrives. Call ahead before visiting your doctor.  Call ahead. Many medical visits for routine care are being postponed or done by phone or telemedicine.  If you have a medical appointment that cannot be postponed, call your doctor's office. This will help the office protect themselves and other patients. If you are sick, wear a cloth covering over your nose and mouth.  You should wear a cloth face covering over your nose and mouth if you must be around other people or animals, including pets (even at home).  You don't need to wear the cloth face covering if you are alone. If you can't put on a cloth face covering (because of trouble breathing for example), cover your coughs and sneezes in some other way. Try to stay at least 6 feet away from other people. This will help protect the people around you. Note: During the COVID-19 pandemic, medical grade facemasks are reserved for healthcare workers and some first responders. You may need to make a cloth face covering using a scarf or bandana. Cover your coughs and sneezes.  Cover your mouth and nose with a tissue when you cough or sneeze.  Throw used tissues in a lined trash can.  Immediately wash your hands with soap and water for at least 20 seconds. If soap and water are not available, clean your hands with an alcohol-based hand sanitizer that contains at least 60% alcohol. Clean your hands often.    Wash your hands often with soap and water for at least 20 seconds. This is especially important after blowing your nose, coughing, or sneezing; going to the bathroom; and before eating or preparing food.  Use hand sanitizer if soap and water are not available. Use an alcohol-based hand sanitizer with at least 60% alcohol, covering all surfaces of your hands and rubbing  them together until they feel dry.  Soap and water are the best option, especially if your hands are visibly dirty.  Avoid touching your eyes, nose, and mouth with unwashed hands. Avoid sharing personal household items.  Do not share dishes, drinking glasses, cups, eating utensils, towels, or bedding with other people in your home.  Wash these items thoroughly after using them with soap and water or put them in the dishwasher. Clean all "high-touch" surfaces everyday.  Clean and disinfect high-touch surfaces in your "sick room" and bathroom. Let someone else clean and disinfect surfaces in common areas, but not your bedroom and bathroom.  If a caregiver or other person needs to clean and disinfect a sick person's bedroom or bathroom, they should do so on an as-needed basis. The caregiver/other person should wear a mask and wait as long as possible after the sick person has used the bathroom. High-touch surfaces include phones, remote controls, counters, tabletops, doorknobs, bathroom fixtures, toilets, keyboards, tablets, and bedside tables.  Clean and disinfect areas that may have blood, stool, or body fluids on them.  Use household cleaners and disinfectants. Clean the area or item with soap and water or another detergent if it is dirty. Then use a household disinfectant. ? Be sure to follow the instructions on the label to ensure safe and effective use of the product. Many products recommend keeping the surface wet for several minutes to ensure germs are killed. Many also recommend precautions such as wearing gloves and making sure you have good ventilation during use of the product. ? Most EPA-registered household disinfectants should be effective. How to discontinue home isolation  People with COVID-19 who have stayed home (home isolated) can stop home isolation under the following conditions: ? If you will not have a test to determine if you are still contagious, you can leave home  after these three things have happened:  You have had no fever for at least 72 hours (that is three full days of no fever without the use of medicine that reduces fevers) AND  other symptoms have improved (for example, when your cough or shortness of breath has improved) AND  at least 10 days have passed since your symptoms first appeared. ? If you will be tested to determine if you are still contagious, you can leave home after these three things have happened:  You no longer have a fever (without the use of medicine that reduces fevers) AND  other symptoms have improved (for example, when your cough or shortness of breath has improved) AND  you received two negative tests in a row, 24 hours apart. Your doctor will follow CDC guidelines. In all cases, follow the guidance of your healthcare provider and local health department. The decision to stop home isolation should be made in consultation with your healthcare provider and state and local health departments. Local decisions depend on local circumstances. cdc.gov/coronavirus 09/29/2018 This information is not intended to replace advice given to you by your health care provider. Make sure you discuss any questions you have with your health care provider. Document Released: 09/10/2018 Document Revised: 10/09/2018 Document Reviewed: 09/10/2018   Elsevier Patient Education  2020 [employer]vier Inc.  

## 2019-05-27 NOTE — Progress Notes (Signed)
Telephone visit  Subjective: CC: congestion PCP: [name], [name], MD Pam Chen is a 48 y.o. female calls for telephone consult today. Patient provides verbal consent for consult held via phone.  Due to COVID-19 pandemic this visit was conducted virtually. This visit type was conducted due to national recommendations for restrictions regarding the COVID-19 Pandemic (e.g. social distancing, sheltering in place) in an effort to limit this patient's exposure and mitigate transmission in our community. All issues noted in this document were discussed and addressed.  A physical exam was not performed with this format.   Location of patient: car Location of provider: [facility] Others present for call: none  1. Congestion Patient reports harsh cough that has been present x2 days.  Cough is worse in the morning and at night.  She reports cough is productive and green in color.  She reports sinusitis and rhinorrhea.  Denies fevers, chills, nausea, vomiting.  She reports right ear still hurts some.  She reports cold air makes it worse.  No drainage from ear.  She is taking mucinex.  She denies sick contacts.  She is a smoker.    ROS: Per HPI  Allergies  Allergen Reactions  . Aspirin Other (See Comments)    Upset tummy.  Can take ibuprofen.  . Oxycodone-Acetaminophen Anxiety    Can take vicodin  . Tramadol Hcl Nausea Only   Past Medical History:  Diagnosis Date  . Arthritis   . Diverticulitis   . GERD (gastroesophageal reflux disease)   . Hypertension   . Kidney stones   . Migraine   . Rotator cuff injury   . Sleep apnea    CPAP  . Tennis elbow   . Thyroid disease     Current Outpatient Medications:  .  albuterol (VENTOLIN HFA) 108 (90 Base) MCG/ACT inhaler, TAKE 1 2 PUFFS AS NEEDED FOR WHEEZING/CHEST TIGHTNESS, Disp: , Rfl:  .  amoxicillin-clavulanate (AUGMENTIN) 875-125 MG tablet, Take 1 tablet by mouth 2 (two) times daily. Take all of this medication, Disp: 20 tablet, Rfl:  0 .  ARIPiprazole (ABILIFY) 15 MG tablet, Take 15 mg by mouth daily., Disp: , Rfl:  .  busPIRone (BUSPAR) 10 MG tablet, Take 1 tablet (10 mg total) by mouth 2 (two) times daily., Disp: 60 tablet, Rfl: 1 .  Cholecalciferol (HM VITAMIN D3) 4000 units CAPS, Take 4,000 Units by mouth daily., Disp: , Rfl:  .  FLUoxetine HCl 60 MG TABS, Take 60 mg by mouth daily., Disp: , Rfl:  .  Galcanezumab-gnlm (EMGALITY) 120 MG/ML SOAJ, Inject 120 mg into the skin every 30 (thirty) days. Needs OV, Disp: 1 pen, Rfl: 0 .  levothyroxine (SYNTHROID) 75 MCG tablet, Take 1 tablet (75 mcg total) by mouth daily., Disp: 90 tablet, Rfl: 3 .  linaclotide (LINZESS) 290 MCG CAPS capsule, Take 1 capsule (290 mcg total) by mouth daily before breakfast., Disp: 90 capsule, Rfl: 1 .  Multiple Vitamin (MULTIVITAMIN WITH MINERALS) TABS tablet, Take 1 tablet by mouth daily., Disp: , Rfl:  .  propranolol ER (INDERAL LA) 120 MG 24 hr capsule, Take 1 capsule (120 mg total) by mouth at bedtime., Disp: 90 capsule, Rfl: 3 .  rizatriptan (MAXALT) 10 MG tablet, TAKE 1 TABLET BY MOUTH AS NEEDED FOR MIGRAINE. MAY REPEAT IN 2 HOURS IF NEEDED, Disp: 10 tablet, Rfl: 0 .  topiramate (TOPAMAX) 100 MG tablet, Take 1 tablet (100 mg total) by mouth 2 (two) times daily., Disp: 60 tablet, Rfl: 11  Assessment/ Plan: 48 y.o.  female   1. Bronchitis We will empirically treat with oral antibiotics given duration of symptoms.  We discussed need for evaluation should things get worse.  I agree with smoking cessation as this is likely exacerbating symptoms.  Recommend isolation precautions as outlined per CDC guidelines.  She has had 3 - Covid test thus far.  She will follow-up as needed - doxycycline (VIBRA-TABS) 100 MG tablet; Take 1 tablet (100 mg total) by mouth 2 (two) times daily for 7 days.  Dispense: 14 tablet; Refill: 0 - benzonatate (TESSALON) 200 MG capsule; Take 1 capsule (200 mg total) by mouth 3 (three) times daily as needed for cough.  Dispense: 20  capsule; Refill: 0 - albuterol (VENTOLIN HFA) 108 (90 Base) MCG/ACT inhaler; TAKE 1-2 PUFFS AS NEEDED FOR WHEEZING/CHEST TIGHTNESS  Dispense: 18 g; Refill: 1  2. Tobacco use Action phase of smoking cessation   Start time: 12:56pm End time: 1:03pm  Total time spent on patient care (including telephone call/ virtual visit): 10 minutes  [place], [place] [phone]

## 2019-05-28 ENCOUNTER — Telehealth: Payer: Self-pay | Admitting: *Deleted

## 2019-05-28 NOTE — Telephone Encounter (Signed)
Prior Auth for Rizatriptan 10mg -APPROVED till 05/27/20  PA# [id number]  [employer] online portal  Pharmacy notified

## 2019-06-04 ENCOUNTER — Telehealth: Payer: Self-pay | Admitting: Family Medicine

## 2019-06-04 MED ORDER — VALACYCLOVIR HCL 1 G PO TABS: 1000.0000 mg | ORAL_TABLET | Freq: Two times a day (BID) | ORAL | 0 refills | Status: DC

## 2019-06-04 NOTE — Telephone Encounter (Signed)
Wants to speak with nurse regarding 2 Rx refills.

## 2019-06-04 NOTE — Telephone Encounter (Signed)
Patient was on Myrbetric 25mg  once a day

## 2019-06-04 NOTE — Telephone Encounter (Signed)
Patient wanting refill of valtrex having an outbreak. Also wants to go up a dose on bladder control meds. Patient uses [facility] in [place].

## 2019-06-04 NOTE — Telephone Encounter (Signed)
I have sent the Valtrex, I do not have a bladder control med on her list, it must of dropped off, what medication was she on and what dose? Arville Care, MD [facility] 06/04/2019, 2:52 PM

## 2019-06-05 MED ORDER — MIRABEGRON ER 25 MG PO TB24: 25.0000 mg | ORAL_TABLET | Freq: Every day | ORAL | 5 refills | Status: DC

## 2019-06-05 NOTE — Telephone Encounter (Signed)
I sent the refill for Myrbetriq for the patient

## 2019-06-05 NOTE — Telephone Encounter (Signed)
Pt aware.

## 2019-06-06 ENCOUNTER — Telehealth: Payer: Self-pay | Admitting: *Deleted

## 2019-06-06 MED ORDER — FESOTERODINE FUMARATE ER 4 MG PO TB24: 4.0000 mg | ORAL_TABLET | Freq: Every day | ORAL | 1 refills | Status: DC

## 2019-06-06 NOTE — Telephone Encounter (Signed)
Please let the patient know that the insurance company wants [name] to try and fail at least 2 other medications before going to cover Myrbetriq.  She has already tried the 1 so I have sent a second 1 that she can try for her bladder and if it does not work over this month and let me know and we can try the Myrbetriq again.

## 2019-06-06 NOTE — Telephone Encounter (Signed)
Myrbetriq 25mg  Non-Preferred by pt insurance  Must try and fail 2 preferred to get PA: Preferred are oxybutynin , TOVIAZ and Vesicare  Pt has tried but must try and fail one other preferred.

## 2019-06-09 NOTE — Telephone Encounter (Signed)
Pt aware and voiced understanding 

## 2019-06-16 ENCOUNTER — Ambulatory Visit (INDEPENDENT_AMBULATORY_CARE_PROVIDER_SITE_OTHER): Payer: Commercial Managed Care - PPO | Admitting: Physician Assistant

## 2019-06-16 ENCOUNTER — Encounter: Payer: Self-pay | Admitting: Physician Assistant

## 2019-06-16 DIAGNOSIS — R05 Cough: Secondary | ICD-10-CM

## 2019-06-16 DIAGNOSIS — R0981 Nasal congestion: Secondary | ICD-10-CM

## 2019-06-16 DIAGNOSIS — J209 Acute bronchitis, unspecified: Secondary | ICD-10-CM

## 2019-06-16 DIAGNOSIS — R059 Cough, unspecified: Secondary | ICD-10-CM

## 2019-06-16 MED ORDER — HYDROCODONE-HOMATROPINE 5-1.5 MG/5ML PO SYRP: 5.0000 mL | ORAL_SOLUTION | Freq: Four times a day (QID) | ORAL | 0 refills | Status: DC | PRN

## 2019-06-16 MED ORDER — AZITHROMYCIN 250 MG PO TABS: ORAL_TABLET | ORAL | 0 refills | Status: DC

## 2019-06-16 NOTE — Progress Notes (Signed)
Telephone visit  Subjective: CC: Bronchitis PCP: [name], [name], MD NIO:EVOJJ Pam Chen is a 49 y.o. female calls for telephone consult today. Patient provides verbal consent for consult held via phone.  Patient is identified with 2 separate identifiers.  At this time the entire area is on COVID-19 social distancing and stay home orders are in place.  Patient is of higher risk and therefore we are performing this by a virtual method.  Location of patient: Home Location of provider: [facility] Others present for call: no  Patient with several days of progressing upper respiratory and bronchial symptoms. Initially there was more upper respiratory congestion. This progressed to having significant cough that is productive throughout the day and severe at night. There is occasional wheezing after coughing. Sometimes there is slight dyspnea on exertion. It is productive mucus that is yellow in color. Denies any blood.  She currently has albuterol to use and have encouraged her to do that 4 times a day.  She does get bronchitis several times through the winter.   ROS: Per HPI  Allergies  Allergen Reactions  . Aspirin Other (See Comments)    Upset tummy.  Can take ibuprofen.  . Oxycodone-Acetaminophen Anxiety    Can take vicodin  . Tramadol Hcl Nausea Only   Past Medical History:  Diagnosis Date  . Arthritis   . Diverticulitis   . GERD (gastroesophageal reflux disease)   . Hypertension   . Kidney stones   . Migraine   . Rotator cuff injury   . Sleep apnea    CPAP  . Tennis elbow   . Thyroid disease      Current Outpatient Medications:  .  albuterol (VENTOLIN HFA) 108 (90 Base) MCG/ACT inhaler, TAKE 1-2 PUFFS AS NEEDED FOR WHEEZING/CHEST TIGHTNESS, Disp: 18 g, Rfl: 1 .  ARIPiprazole (ABILIFY) 15 MG tablet, Take 15 mg by mouth daily., Disp: , Rfl:  .  azithromycin (ZITHROMAX Z-PAK) 250 MG tablet, Take as directed, Disp: 6 each, Rfl: 0 .  busPIRone (BUSPAR) 10 MG tablet, Take 1 tablet (10 mg total) by mouth 2 (two) times daily., Disp: 60 tablet, Rfl: 1 .  Cholecalciferol (HM VITAMIN D3) 4000 units CAPS, Take 4,000 Units by mouth daily., Disp: , Rfl:  .  fesoterodine (TOVIAZ) 4 MG TB24 tablet, Take 1 tablet (4 mg total) by mouth daily.,  Disp: 30 tablet, Rfl: 1 .  FLUoxetine HCl 60 MG TABS, Take 60 mg by mouth daily., Disp: , Rfl:  .  Galcanezumab-gnlm (EMGALITY) 120 MG/ML SOAJ, Inject 120 mg into the skin every 30 (thirty) days. Needs OV, Disp: 1 pen, Rfl: 0 .  HYDROcodone-homatropine (HYCODAN) 5-1.5 MG/5ML syrup, Take 5 mLs by mouth every 6 (six) hours as needed for cough., Disp: 120 mL, Rfl: 0 .  levothyroxine (SYNTHROID) 75 MCG tablet, Take 1 tablet (75 mcg total) by mouth daily., Disp: 90 tablet, Rfl: 3 .  linaclotide (LINZESS) 290 MCG CAPS capsule, Take 1 capsule (290 mcg total) by mouth daily before breakfast., Disp: 90 capsule, Rfl: 1 .  mirabegron ER (MYRBETRIQ) 25 MG TB24 tablet, Take 1 tablet (25 mg total) by mouth daily., Disp: 30 tablet, Rfl: 5 .  Multiple Vitamin (MULTIVITAMIN WITH MINERALS) TABS tablet, Take 1 tablet by mouth daily., Disp: , Rfl:  .  propranolol ER (INDERAL LA) 120 MG 24 hr capsule, Take 1 capsule (120 mg total) by mouth at bedtime., Disp: 90 capsule, Rfl: 3 .  rizatriptan (MAXALT) 10 MG tablet, TAKE 1 TABLET BY MOUTH AS NEEDED FOR MIGRAINE. MAY REPEAT IN 2 HOURS IF NEEDED, Disp: 10 tablet, Rfl: 0 .  topiramate (TOPAMAX) 100 MG tablet, Take 1 tablet (100 mg total) by mouth 2 (two) times daily., Disp: 60 tablet, Rfl: 11 .  valACYclovir (VALTREX) 1000 MG tablet, Take  1 tablet (1,000 mg total) by mouth 2 (two) times daily., Disp: 20 tablet, Rfl: 0  Assessment/ Plan: 49 y.o. female   1. Cough - azithromycin (ZITHROMAX Z-PAK) 250 MG tablet; Take as directed  Dispense: 6 each; Refill: 0 - HYDROcodone-homatropine (HYCODAN) 5-1.5 MG/5ML syrup; Take 5 mLs by mouth every 6 (six) hours as needed for cough.  Dispense: 120 mL; Refill: 0  2. Sinus congestion - azithromycin (ZITHROMAX Z-PAK) 250 MG tablet; Take as directed  Dispense: 6 each; Refill: 0 - HYDROcodone-homatropine (HYCODAN) 5-1.5 MG/5ML syrup; Take 5 mLs by mouth every 6 (six) hours as needed for cough.  Dispense: 120 mL; Refill: 0  3. Acute bronchitis, unspecified organism - azithromycin (ZITHROMAX Z-PAK) 250 MG tablet; Take as directed  Dispense: 6 each; Refill: 0 - HYDROcodone-homatropine (HYCODAN) 5-1.5 MG/5ML syrup; Take 5 mLs by mouth every 6 (six) hours as needed for cough.  Dispense: 120 mL; Refill: 0   Return if symptoms worsen or fail to improve.  Continue all other maintenance medications as listed above.  Start time: 9:37 AM End time: 9:44 AM  Meds ordered this encounter  Medications  . azithromycin (ZITHROMAX Z-PAK) 250 MG tablet    Sig: Take as directed    Dispense:  6 each    Refill:  0    Order Specific Question:   Supervising Provider    Answer:   Janora Norlander [[id number]]  . HYDROcodone-homatropine (HYCODAN) 5-1.5 MG/5ML syrup    Sig: Take 5 mLs by mouth every 6 (six) hours as needed for cough.    Dispense:  120 mL    Refill:  0    Order Specific Question:   Supervising Provider    Answer:   Janora Norlander [[id number]]    Particia Nearing PA-C [place] [phone]

## 2019-06-18 ENCOUNTER — Other Ambulatory Visit: Payer: Self-pay | Admitting: Family Medicine

## 2019-06-20 ENCOUNTER — Telehealth: Payer: Self-pay | Admitting: *Deleted

## 2019-06-20 NOTE — Telephone Encounter (Signed)
Prior Auth for [employer] 120mg /ml auto injectors-APPROVED till 06/19/20  Key: 06/21/20 -   PA Case ID: [id number]  Pharmacy notified

## 2019-06-23 ENCOUNTER — Other Ambulatory Visit: Payer: Self-pay

## 2019-06-23 ENCOUNTER — Ambulatory Visit (INDEPENDENT_AMBULATORY_CARE_PROVIDER_SITE_OTHER): Payer: Commercial Managed Care - PPO | Admitting: Family Medicine

## 2019-06-23 DIAGNOSIS — J40 Bronchitis, not specified as acute or chronic: Secondary | ICD-10-CM

## 2019-06-23 MED ORDER — ALBUTEROL SULFATE HFA 108 (90 BASE) MCG/ACT IN AERS: INHALATION_SPRAY | RESPIRATORY_TRACT | 1 refills | Status: DC

## 2019-06-23 NOTE — Progress Notes (Signed)
Telephone visit  Subjective: CC: bronchitis PCP: [name], [name], MD DGL:OVFIE Mahlum is a 49 y.o. female calls for telephone consult today. Patient provides verbal consent for consult held via phone.  Due to COVID-19 pandemic this visit was conducted virtually. This visit type was conducted due to national recommendations for restrictions regarding the COVID-19 Pandemic (e.g. social distancing, sheltering in place) in an effort to limit this patient's exposure and mitigate transmission in our community. All issues noted in this document were discussed and addressed.  A physical exam was not performed with this format.   Location of patient: home Location of provider: [facility] Others present for call: none  1. Bronchitis Patient reports ongoing coughing, wheezing.  She was seen at urgent care on the 21st and was switched over to Levaquin.  She is status post treatment with a prednisone Dosepak but she needs a refill on her albuterol.  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  Overall, she feels that she is trying to get better but is not quite there yet.  However, she does want to go back to work tomorrow.   ROS: Per HPI  Allergies  Allergen Reactions  . Aspirin Other (See Comments)    Upset tummy.  Can take ibuprofen.  . Oxycodone-Acetaminophen Anxiety    Can take vicodin  . Tramadol Hcl Nausea Only   Past Medical History:  Diagnosis Date  . Arthritis   . Diverticulitis   . GERD (gastroesophageal reflux disease)   . Hypertension   . Kidney stones   . Migraine   . Rotator cuff injury   . Sleep apnea    CPAP  . Tennis elbow   . Thyroid disease     Current Outpatient Medications:  .  albuterol (VENTOLIN HFA) 108 (90 Base) MCG/ACT inhaler, TAKE 1-2 PUFFS AS NEEDED FOR WHEEZING/CHEST TIGHTNESS, Disp: 18 g, Rfl: 1 .  ARIPiprazole (ABILIFY) 15 MG tablet, Take 15 mg by mouth daily., Disp: , Rfl:  .  azithromycin (ZITHROMAX Z-PAK) 250 MG tablet, Take as directed,  Disp: 6 each, Rfl: 0 .  busPIRone (BUSPAR) 10 MG tablet, Take 1 tablet (10 mg total) by mouth 2 (two) times daily., Disp: 60 tablet, Rfl: 1 .  Cholecalciferol (HM VITAMIN D3) 4000 units CAPS, Take 4,000 Units by mouth daily., Disp: , Rfl:  .  fesoterodine (TOVIAZ) 4 MG TB24 tablet, Take 1 tablet (4 mg total) by mouth daily., Disp: 30 tablet, Rfl: 1 .  FLUoxetine HCl 60 MG TABS, Take 60 mg by mouth daily., Disp: , Rfl:  .  Galcanezumab-gnlm (EMGALITY) 120 MG/ML SOAJ, Inject 120 mg into the skin every 30 (thirty) days. (Needs to be seen before next refill), Disp: 1 mL, Rfl: 0 .  HYDROcodone-homatropine (HYCODAN) 5-1.5 MG/5ML syrup, Take 5 mLs by mouth every 6 (six) hours as needed for cough., Disp: 120 mL, Rfl: 0 .  levothyroxine (SYNTHROID) 75 MCG tablet, Take 1 tablet (75 mcg total) by mouth daily., Disp: 90 tablet, Rfl: 3 .  linaclotide (LINZESS) 290 MCG CAPS capsule, Take 1 capsule (290 mcg total) by mouth daily before breakfast., Disp: 90 capsule, Rfl: 1 .  mirabegron ER (MYRBETRIQ) 25 MG TB24 tablet, Take 1 tablet (25 mg total) by mouth daily., Disp: 30 tablet, Rfl: 5 .  Multiple Vitamin (MULTIVITAMIN WITH MINERALS) TABS tablet, Take 1 tablet by mouth daily., Disp: , Rfl:  .  propranolol ER (INDERAL LA) 120 MG 24 hr capsule, Take 1 capsule (120 mg total) by  mouth at bedtime., Disp: 90 capsule, Rfl: 3 .  rizatriptan (MAXALT) 10 MG tablet, TAKE 1 TABLET BY MOUTH AS NEEDED FOR MIGRAINE. MAY REPEAT IN 2 HOURS IF NEEDED, Disp: 10 tablet, Rfl: 0 .  topiramate (TOPAMAX) 100 MG tablet, Take 1 tablet (100 mg total) by mouth 2 (two) times daily., Disp: 60 tablet, Rfl: 11 .  valACYclovir (VALTREX) 1000 MG tablet, Take 1 tablet (1,000 mg total) by mouth 2 (two) times daily., Disp: 20 tablet, Rfl: 0  Assessment/ Plan: 49 y.o. female   1. Bronchitis Improving but not resolved.  I have given her a work note for return to work given negative Covid testing.  She will follow-up as needed. - albuterol  (VENTOLIN HFA) 108 (90 Base) MCG/ACT inhaler; TAKE 1-2 PUFFS AS NEEDED FOR WHEEZING/CHEST TIGHTNESS  Dispense: 18 g; Refill: 1   Start time: 4:54pm End time: 4:58pm  Total time spent on patient care (including telephone call/ virtual visit): 10 minutes  [place], [place] [phone]

## 2019-07-02 ENCOUNTER — Ambulatory Visit (HOSPITAL_COMMUNITY)
Admission: RE | Admit: 2019-07-02 | Discharge: 2019-07-02 | Disposition: A | Discharge status: Home / Self Care | Payer: Commercial Managed Care - PPO | Source: Ambulatory Visit | Attending: Family Medicine | Admitting: Family Medicine

## 2019-07-02 ENCOUNTER — Other Ambulatory Visit: Payer: Self-pay

## 2019-07-02 ENCOUNTER — Ambulatory Visit (INDEPENDENT_AMBULATORY_CARE_PROVIDER_SITE_OTHER): Payer: Commercial Managed Care - PPO | Admitting: Family Medicine

## 2019-07-02 DIAGNOSIS — B9789 Other viral agents as the cause of diseases classified elsewhere: Secondary | ICD-10-CM | POA: Diagnosis not present

## 2019-07-02 DIAGNOSIS — J329 Chronic sinusitis, unspecified: Secondary | ICD-10-CM | POA: Diagnosis present

## 2019-07-02 DIAGNOSIS — J40 Bronchitis, not specified as acute or chronic: Secondary | ICD-10-CM | POA: Insufficient documentation

## 2019-07-02 MED ORDER — PREDNISONE 20 MG PO TABS: ORAL_TABLET | ORAL | 0 refills | Status: DC

## 2019-07-02 MED ORDER — MOXIFLOXACIN HCL 400 MG PO TABS: 400.0000 mg | ORAL_TABLET | Freq: Every day | ORAL | 0 refills | Status: DC

## 2019-07-02 NOTE — Progress Notes (Signed)
Virtual Visit via telephone Note  I connected with Pam Chen on 07/02/19 at 1322 by telephone and verified that I am speaking with the correct person using two identifiers. Pam Chen is currently located at home and no other people are currently with her during visit. The provider, [name] Sarie Stall, MD is located in their office at time of visit.  Call ended at 1331  I discussed the limitations, risks, security and privacy concerns of performing an evaluation and management service by telephone and the availability of in person appointments. I also discussed with the patient that there may be a patient responsible charge related to this service. The patient expressed understanding and agreed to proceed.   History and Present Illness: Patient is calling for a 1 month history of earache and sinus congestion and now sore throat and worsening cough.  She feels like her throat is worsening and she is using lozenges and her sore throat is getting worse.  She is having a lot of sinus drainage and kept her up last night.  She is on her 3rd round of steroids and just finished levaquin for 7 days.  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  She is also using albuterol and it is helping.   1. Sinobronchitis     Outpatient Encounter Medications as of 07/02/2019  Medication Sig  . albuterol (VENTOLIN HFA) 108 (90 Base) MCG/ACT inhaler TAKE 1-2 PUFFS AS NEEDED FOR WHEEZING/CHEST TIGHTNESS  . ARIPiprazole (ABILIFY) 15 MG tablet Take 15 mg by mouth daily.  . busPIRone (BUSPAR) 10 MG tablet Take 1 tablet (10 mg total) by mouth 2 (two) times daily.  . Cholecalciferol (HM VITAMIN D3) 4000 units CAPS Take 4,000 Units by mouth daily.  . fesoterodine (TOVIAZ) 4 MG TB24 tablet Take 1 tablet (4 mg total) by mouth daily.  Marland Kitchen FLUoxetine HCl 60 MG TABS Take 60 mg by mouth daily.  . Galcanezumab-gnlm (EMGALITY) 120 MG/ML SOAJ Inject 120 mg into the skin every 30 (thirty) days. (Needs to be seen before  next refill)  . HYDROcodone-homatropine (HYCODAN) 5-1.5 MG/5ML syrup Take 5 mLs by mouth every 6 (six) hours as needed for cough.  . levothyroxine (SYNTHROID) 75 MCG tablet Take 1 tablet (75 mcg total) by mouth daily.  Marland Kitchen linaclotide (LINZESS) 290 MCG CAPS capsule Take 1 capsule (290 mcg total) by mouth daily before breakfast.  . mirabegron ER (MYRBETRIQ) 25 MG TB24 tablet Take 1 tablet (25 mg total) by mouth daily.  Marland Kitchen moxifloxacin (AVELOX) 400 MG tablet Take 1 tablet (400 mg total) by mouth daily.  . Multiple Vitamin (MULTIVITAMIN WITH MINERALS) TABS tablet Take 1 tablet by mouth daily.  . predniSONE (DELTASONE) 20 MG tablet Take 3 tabs daily for 1 week, then 2 tabs daily for week 2, then 1 tab daily for week 3.  . propranolol ER (INDERAL LA) 120 MG 24 hr capsule Take 1 capsule (120 mg total) by mouth at bedtime.  . rizatriptan (MAXALT) 10 MG tablet TAKE 1 TABLET BY MOUTH AS NEEDED FOR MIGRAINE. MAY REPEAT IN 2 HOURS IF NEEDED  . topiramate (TOPAMAX) 100 MG tablet Take 1 tablet (100 mg total) by mouth 2 (two) times daily.  . valACYclovir (VALTREX) 1000 MG tablet Take 1 tablet (1,000 mg total) by mouth 2 (two) times daily.   No facility-administered encounter medications on file as of 07/02/2019.    Review of Systems  Constitutional: Negative for chills and fever.  HENT: Positive for congestion, postnasal drip,  rhinorrhea, sinus pressure, sneezing and sore throat. Negative for ear discharge and ear pain.   Eyes: Negative for visual disturbance.  Respiratory: Positive for cough and wheezing. Negative for chest tightness and shortness of breath.   Cardiovascular: Negative for chest pain and leg swelling.  Musculoskeletal: Negative for back pain, gait problem and myalgias.  Skin: Negative for rash.  Neurological: Negative for light-headedness and headaches.  Psychiatric/Behavioral: Negative for agitation and behavioral problems.  All other systems reviewed and are  negative.   Observations/Objective: Patient sounds comfortable and in no acute distress  Assessment and Plan: Problem List Items Addressed This Visit    None    Visit Diagnoses    Sinobronchitis    -  Primary   Relevant Medications   moxifloxacin (AVELOX) 400 MG tablet   predniSONE (DELTASONE) 20 MG tablet   Other Relevant Orders   DG Chest 2 View       Follow up plan: Return if symptoms worsen or fail to improve.     I discussed the assessment and treatment plan with the patient. The patient was provided an opportunity to ask questions and all were answered. The patient agreed with the plan and demonstrated an understanding of the instructions.   The patient was advised to call back or seek an in-person evaluation if the symptoms worsen or if the condition fails to improve as anticipated.  The above assessment and management plan was discussed with the patient. The patient verbalized understanding of and has agreed to the management plan. Patient is aware to call the clinic if symptoms persist or worsen. Patient is aware when to return to the clinic for a follow-up visit. Patient educated on when it is appropriate to go to the emergency department.    I provided 9 minutes of non-face-to-face time during this encounter.    Worthy Rancher, MD

## 2019-07-19 ENCOUNTER — Other Ambulatory Visit: Payer: Self-pay | Admitting: Family Medicine

## 2019-07-19 DIAGNOSIS — F410 Panic disorder [episodic paroxysmal anxiety] without agoraphobia: Secondary | ICD-10-CM

## 2019-07-29 ENCOUNTER — Other Ambulatory Visit: Payer: Self-pay | Admitting: Family Medicine

## 2019-07-29 MED ORDER — EMGALITY 120 MG/ML ~~LOC~~ SOAJ: 120.0000 mg | SUBCUTANEOUS | 0 refills | Status: DC

## 2019-07-29 NOTE — Telephone Encounter (Signed)
[name]. NTBS 30 days given 06/20/19

## 2019-07-29 NOTE — Addendum Note (Signed)
Addended by: Magdalene River on: 07/29/2019 11:52 AM   Modules accepted: Orders

## 2019-07-30 ENCOUNTER — Encounter: Payer: Self-pay | Admitting: Family Medicine

## 2019-07-30 ENCOUNTER — Telehealth (INDEPENDENT_AMBULATORY_CARE_PROVIDER_SITE_OTHER): Payer: Commercial Managed Care - PPO | Admitting: Family Medicine

## 2019-07-30 DIAGNOSIS — R05 Cough: Secondary | ICD-10-CM

## 2019-07-30 DIAGNOSIS — R062 Wheezing: Secondary | ICD-10-CM

## 2019-07-30 DIAGNOSIS — R059 Cough, unspecified: Secondary | ICD-10-CM

## 2019-07-30 MED ORDER — FLUTICASONE-SALMETEROL 250-50 MCG/DOSE IN AEPB: 1.0000 | INHALATION_SPRAY | Freq: Two times a day (BID) | RESPIRATORY_TRACT | 2 refills | Status: DC

## 2019-07-30 MED ORDER — VALACYCLOVIR HCL 1 G PO TABS: 1000.0000 mg | ORAL_TABLET | Freq: Every day | ORAL | 0 refills | Status: AC

## 2019-07-30 NOTE — Progress Notes (Signed)
Virtual Visit via Video note  I connected with  Pam Chen on 07/31/19 at 4:20 PM by video and verified that I am speaking with the correct person using two identifiers. Pam Chen is currently located in her car and nobody is currently with her during visit. The provider, [name], FNP is located in their office at time of visit.  I discussed the limitations, risks, security and privacy concerns of performing an evaluation and management service by video and the availability of in person appointments. I also discussed with the patient that there may be a patient responsible charge related to this service. The patient expressed understanding and agreed to proceed.  Subjective: PCP: [name], [name], MD  Chief Complaint  Patient presents with  . URI   Patient has concerns that she has been coughing and wheezing with shortness of breath for the past 3 months.  She states she has been treated with 5 or 6 rounds of antibiotics and steroids with each antibiotic.  States that her symptoms are worse in the morning.  She is a current smoker and has been for many years.   ROS: Per HPI  Current Outpatient Medications:  .  albuterol (VENTOLIN HFA) 108 (90 Base) MCG/ACT inhaler, TAKE 1-2 PUFFS AS NEEDED FOR WHEEZING/CHEST TIGHTNESS, Disp: 18 g, Rfl: 1 .  ARIPiprazole (ABILIFY) 15 MG tablet, Take 15 mg by mouth daily., Disp: , Rfl:  .  busPIRone (BUSPAR) 10 MG tablet, Take 1 tablet (10 mg total) by mouth 2 (two) times daily. (Needs to be seen before next refill), Disp: 60 tablet, Rfl: 0 .  Cholecalciferol (HM VITAMIN D3) 4000 units CAPS, Take 4,000 Units by mouth daily., Disp: , Rfl:  .  fesoterodine (TOVIAZ) 4 MG TB24 tablet, Take 1 tablet (4 mg total) by mouth daily., Disp: 30 tablet, Rfl: 1 .  FLUoxetine HCl 60 MG TABS, Take 60 mg by mouth daily., Disp: , Rfl:  .  Fluticasone-Salmeterol (ADVAIR) 250-50 MCG/DOSE AEPB, Inhale 1 puff into the lungs every 12 (twelve) hours., Disp: 60  each, Rfl: 2 .  Galcanezumab-gnlm (EMGALITY) 120 MG/ML SOAJ, Inject 120 mg into the skin every 30 (thirty) days. (Needs to be seen before next refill), Disp: 1 mL, Rfl: 0 .  HYDROcodone-homatropine (HYCODAN) 5-1.5 MG/5ML syrup, Take 5 mLs by mouth every 6 (six) hours as needed for cough., Disp: 120 mL, Rfl: 0 .  levothyroxine (SYNTHROID) 75 MCG tablet, Take 1 tablet (75 mcg total) by mouth daily., Disp: 90 tablet, Rfl: 3 .  linaclotide (LINZESS) 290 MCG CAPS capsule, Take 1 capsule (290 mcg total) by mouth daily before breakfast., Disp: 90 capsule, Rfl: 1 .  mirabegron ER (MYRBETRIQ) 25 MG TB24 tablet, Take 1 tablet (25 mg total) by mouth daily., Disp: 30 tablet, Rfl: 5 .  moxifloxacin (AVELOX) 400 MG tablet, Take 1 tablet (400 mg total) by mouth daily., Disp: 7 tablet, Rfl: 0 .  Multiple Vitamin (MULTIVITAMIN WITH MINERALS) TABS tablet, Take 1 tablet by mouth daily., Disp: , Rfl:  .  predniSONE (DELTASONE) 20 MG tablet, Take 3 tabs daily for 1 week, then 2 tabs daily for week 2, then 1 tab daily for week 3., Disp: 42 tablet, Rfl: 0 .  propranolol ER (INDERAL LA) 120 MG 24 hr capsule, Take 1 capsule (120 mg total) by mouth at bedtime., Disp: 90 capsule, Rfl: 3 .  rizatriptan (MAXALT) 10 MG tablet, TAKE 1 TABLET BY MOUTH AS NEEDED FOR MIGRAINE. MAY REPEAT IN 2 HOURS IF  NEEDED, Disp: 10 tablet, Rfl: 0 .  topiramate (TOPAMAX) 100 MG tablet, Take 1 tablet (100 mg total) by mouth 2 (two) times daily., Disp: 60 tablet, Rfl: 11 .  valACYclovir (VALTREX) 1000 MG tablet, Take 1 tablet (1,000 mg total) by mouth daily for 5 days., Disp: 20 tablet, Rfl: 0  Allergies  Allergen Reactions  . Aspirin Other (See Comments)    Upset tummy.  Can take ibuprofen.  . Oxycodone-Acetaminophen Anxiety    Can take vicodin  . Tramadol Hcl Nausea Only   Past Medical History:  Diagnosis Date  . Arthritis   . Diverticulitis   . GERD (gastroesophageal reflux disease)   . Hypertension   . Kidney stones   . Migraine   .  Rotator cuff injury   . Sleep apnea    CPAP  . Tennis elbow   . Thyroid disease     Observations/Objective: Physical Exam Constitutional:      General: She is not in acute distress.    Appearance: Normal appearance. She is not ill-appearing or toxic-appearing.  Eyes:     General: No scleral icterus.       Right eye: No discharge.        Left eye: No discharge.     Conjunctiva/sclera: Conjunctivae normal.  Pulmonary:     Effort: Pulmonary effort is normal. No respiratory distress.     Comments: Coughing during visit Neurological:     Mental Status: She is alert and oriented to person, place, and time.  Psychiatric:        Mood and Affect: Mood normal.        Behavior: Behavior normal.        Thought Content: Thought content normal.        Judgment: Judgment normal.    Assessment and Plan: 1-2. Cough/Wheezing - Discussed COPD with patient.  I did tell her I am not diagnosing her with this as I do not diagnose anyone with COPD without pulmonary function testing which I am unable to obtain at this time due to the current COVID-19 pandemic without referring to pulmonology, which the patient does not want to do at this time.  She states she has a hard time getting off work.  We are going to try Advair inhaler to see if this improves her symptoms.  She has an appointment scheduled with her PCP later this month.  Education provided on COPD. - Fluticasone-Salmeterol (ADVAIR) 250-50 MCG/DOSE AEPB; Inhale 1 puff into the lungs every 12 (twelve) hours.  Dispense: 60 each; Refill: 2   Follow Up Instructions: Return as scheduled with PCP.   I discussed the assessment and treatment plan with the patient. The patient was provided an opportunity to ask questions and all were answered. The patient agreed with the plan and demonstrated an understanding of the instructions.   The patient was advised to call back or seek an in-person evaluation if the symptoms worsen or if the condition fails to  improve as anticipated.  The above assessment and management plan was discussed with the patient. The patient verbalized understanding of and has agreed to the management plan. Patient is aware to call the clinic if symptoms persist or worsen. Patient is aware when to return to the clinic for a follow-up visit. Patient educated on when it is appropriate to go to the emergency department.   Time call ended: 4:34 PM  I provided 16 minutes of face-to-face time during this encounter.   Hendricks Limes, MSN, APRN, FNP-C  [facility] 07/31/19

## 2019-07-31 NOTE — Patient Instructions (Signed)
Chronic Obstructive Pulmonary Disease Chronic obstructive pulmonary disease (COPD) is a long-term (chronic) lung problem. When you have COPD, it is hard for air to get in and out of your lungs. Usually the condition gets worse over time, and your lungs will never return to normal. There are things you can do to keep yourself as healthy as possible.  Your doctor may treat your condition with: ? Medicines. ? Oxygen. ? Lung surgery.  Your doctor may also recommend: ? Rehabilitation. This includes steps to make your body work better. It may involve a team of specialists. ? Quitting smoking, if you smoke. ? Exercise and changes to your diet. ? Comfort measures (palliative care). Follow these instructions at home: Medicines  Take over-the-counter and prescription medicines only as told by your doctor.  Talk to your doctor before taking any cough or allergy medicines. You may need to avoid medicines that cause your lungs to be dry. Lifestyle  If you smoke, stop. Smoking makes the problem worse. If you need help quitting, ask your doctor.  Avoid being around things that make your breathing worse. This may include smoke, chemicals, and fumes.  Stay active, but remember to rest as well.  Learn and use tips on how to relax.  Make sure you get enough sleep. Most adults need at least 7 hours of sleep every night.  Eat healthy foods. Eat smaller meals more often. Rest before meals. Controlled breathing Learn and use tips on how to control your breathing as told by your doctor. Try:  Breathing in (inhaling) through your nose for 1 second. Then, pucker your lips and breath out (exhale) through your lips for 2 seconds.  Putting one hand on your belly (abdomen). Breathe in slowly through your nose for 1 second. Your hand on your belly should move out. Pucker your lips and breathe out slowly through your lips. Your hand on your belly should move in as you breathe out.  Controlled coughing Learn  and use controlled coughing to clear mucus from your lungs. Follow these steps: 1. Lean your head a little forward. 2. Breathe in deeply. 3. Try to hold your breath for 3 seconds. 4. Keep your mouth slightly open while coughing 2 times. 5. Spit any mucus out into a tissue. 6. Rest and do the steps again 1 or 2 times as needed. General instructions  Make sure you get all the shots (vaccines) that your doctor recommends. Ask your doctor about a flu shot and a pneumonia shot.  Use oxygen therapy and pulmonary rehabilitation if told by your doctor. If you need home oxygen therapy, ask your doctor if you should buy a tool to measure your oxygen level (oximeter).  Make a COPD action plan with your doctor. This helps you to know what to do if you feel worse than usual.  Manage any other conditions you have as told by your doctor.  Avoid going outside when it is very hot, cold, or humid.  Avoid people who have a sickness you can catch (contagious).  Keep all follow-up visits as told by your doctor. This is important. Contact a doctor if:  You cough up more mucus than usual.  There is a change in the color or thickness of the mucus.  It is harder to breathe than usual.  Your breathing is faster than usual.  You have trouble sleeping.  You need to use your medicines more often than usual.  You have trouble doing your normal activities such as getting dressed   or walking around the house. Get help right away if:  You have shortness of breath while resting.  You have shortness of breath that stops you from: ? Being able to talk. ? Doing normal activities.  Your chest hurts for longer than 5 minutes.  Your skin color is more blue than usual.  Your pulse oximeter shows that you have low oxygen for longer than 5 minutes.  You have a fever.  You feel too tired to breathe normally. Summary  Chronic obstructive pulmonary disease (COPD) is a long-term lung problem.  The way your  lungs work will never return to normal. Usually the condition gets worse over time. There are things you can do to keep yourself as healthy as possible.  Take over-the-counter and prescription medicines only as told by your doctor.  If you smoke, stop. Smoking makes the problem worse. This information is not intended to replace advice given to you by your health care provider. Make sure you discuss any questions you have with your health care provider. Document Revised: 04/27/2017 Document Reviewed: 06/19/2016 Elsevier Patient Education  2020 [employer]  

## 2019-08-03 ENCOUNTER — Emergency Department (HOSPITAL_COMMUNITY)
Admission: EM | Admit: 2019-08-03 | Discharge: 2019-08-03 | Disposition: A | Discharge status: Home / Self Care | Payer: Commercial Managed Care - PPO | Attending: Emergency Medicine | Admitting: Emergency Medicine

## 2019-08-03 ENCOUNTER — Encounter (HOSPITAL_COMMUNITY): Payer: Self-pay | Admitting: *Deleted

## 2019-08-03 ENCOUNTER — Other Ambulatory Visit: Payer: Self-pay

## 2019-08-03 DIAGNOSIS — R253 Fasciculation: Secondary | ICD-10-CM | POA: Insufficient documentation

## 2019-08-03 DIAGNOSIS — R079 Chest pain, unspecified: Secondary | ICD-10-CM | POA: Insufficient documentation

## 2019-08-03 DIAGNOSIS — Z5321 Procedure and treatment not carried out due to patient leaving prior to being seen by health care provider: Secondary | ICD-10-CM | POA: Insufficient documentation

## 2019-08-03 DIAGNOSIS — F419 Anxiety disorder, unspecified: Secondary | ICD-10-CM | POA: Insufficient documentation

## 2019-08-03 NOTE — ED Triage Notes (Addendum)
Pt seen at urgent care for abscess tooth, took her first dose antibiotic and steroid.  Pt having twitching to right side of face and with anxiety.   [facility] reported that pt did meth within the last 48 hours.

## 2019-08-03 NOTE — ED Notes (Signed)
Registration reported that pt left at 2022

## 2019-08-05 ENCOUNTER — Encounter: Payer: Self-pay | Admitting: Family

## 2019-08-05 ENCOUNTER — Ambulatory Visit (INDEPENDENT_AMBULATORY_CARE_PROVIDER_SITE_OTHER): Payer: Commercial Managed Care - PPO | Admitting: Family

## 2019-08-05 DIAGNOSIS — F411 Generalized anxiety disorder: Secondary | ICD-10-CM | POA: Diagnosis not present

## 2019-08-05 DIAGNOSIS — F410 Panic disorder [episodic paroxysmal anxiety] without agoraphobia: Secondary | ICD-10-CM | POA: Diagnosis not present

## 2019-08-05 MED ORDER — HYDROXYZINE PAMOATE 25 MG PO CAPS: 25.0000 mg | ORAL_CAPSULE | Freq: Three times a day (TID) | ORAL | 1 refills | Status: DC | PRN

## 2019-08-05 NOTE — Progress Notes (Signed)
   Virtual Visit via telephone Note Due to COVID-19 pandemic this visit was conducted virtually. This visit type was conducted due to national recommendations for restrictions regarding the COVID-19 Pandemic (e.g. social distancing, sheltering in place) in an effort to limit this patient's exposure and mitigate transmission in our community. All issues noted in this document were discussed and addressed.  A physical exam was not performed with this format.  I connected with Pam Chen on 08/05/19 at 11:57 AM  by telephone and verified that I am speaking with the correct person using two identifiers. Pam Chen is currently located at home and no one is currently with her during visit. The provider, Jannifer Rodney, FNP is located in their office at time of visit.  I discussed the limitations, risks, security and privacy concerns of performing an evaluation and management service by telephone and the availability of in person appointments. I also discussed with the patient that there may be a patient responsible charge related to this service. The patient expressed understanding and agreed to proceed.   History and Present Illness:  Anxiety Presents for follow-up visit. Symptoms include decreased concentration, depressed mood, excessive worry, insomnia, irritability, nervous/anxious behavior, palpitations, panic and restlessness. Symptoms occur constantly. The severity of symptoms is causing significant distress. The quality of sleep is good.      Pt calls the office today with increased anxiety and panic attack. She reports she was driving down the road yesterday, and her face and her right side of her body started twitching. She called EMS and went to the ED. She states the ED was full and left because she was feeling better.    Review of Systems  Constitutional: Positive for irritability.  Cardiovascular: Positive for palpitations.  Psychiatric/Behavioral: Positive for decreased  concentration. The patient is nervous/anxious and has insomnia.   All other systems reviewed and are negative.    Observations/Objective: No SOB or distress noted, hoarse voice  Assessment and Plan: 1. GAD (generalized anxiety disorder) - hydrOXYzine (VISTARIL) 25 MG capsule; Take 1 capsule (25 mg total) by mouth 3 (three) times daily as needed.  Dispense: 90 capsule; Refill: 1 - Ambulatory referral to Psychiatry  2. Panic attack - hydrOXYzine (VISTARIL) 25 MG capsule; Take 1 capsule (25 mg total) by mouth 3 (three) times daily as needed.  Dispense: 90 capsule; Refill: 1 - Ambulatory referral to Psychiatry  Continue current medications, will add Vistaril 25 mg as needed for panic attack. I have placed a referral for behavorial Health.  Stress management discussed      I discussed the assessment and treatment plan with the patient. The patient was provided an opportunity to ask questions and all were answered. The patient agreed with the plan and demonstrated an understanding of the instructions.   The patient was advised to call back or seek an in-person evaluation if the symptoms worsen or if the condition fails to improve as anticipated.  The above assessment and management plan was discussed with the patient. The patient verbalized understanding of and has agreed to the management plan. Patient is aware to call the clinic if symptoms persist or worsen. Patient is aware when to return to the clinic for a follow-up visit. Patient educated on when it is appropriate to go to the emergency department.   Time call ended:  12:12 pm  I provided 15 minutes of non-face-to-face time during this encounter.    Jannifer Rodney, FNP

## 2019-08-08 ENCOUNTER — Telehealth: Payer: Self-pay | Admitting: Family Medicine

## 2019-08-08 DIAGNOSIS — F410 Panic disorder [episodic paroxysmal anxiety] without agoraphobia: Secondary | ICD-10-CM

## 2019-08-08 DIAGNOSIS — F319 Bipolar disorder, unspecified: Secondary | ICD-10-CM

## 2019-08-08 DIAGNOSIS — F411 Generalized anxiety disorder: Secondary | ICD-10-CM

## 2019-08-08 NOTE — Telephone Encounter (Signed)
I do not know, have her call them for sure and let them know if she does.  Psych is sometimes different.

## 2019-08-08 NOTE — Telephone Encounter (Signed)
Pam Amend do you know if patient will need referral. Please advise.

## 2019-08-08 NOTE — Telephone Encounter (Signed)
Pt wants to know if she needs a referral to start IOP for Behavioral Health through [facility]. Says she had an appt with her Psychiatrist who increased her dosage with the Abilify and Prozac and recommended she do this program.

## 2019-08-11 ENCOUNTER — Telehealth: Payer: Self-pay | Admitting: Family Medicine

## 2019-08-11 ENCOUNTER — Other Ambulatory Visit: Payer: Self-pay | Admitting: Family Medicine

## 2019-08-11 NOTE — Telephone Encounter (Signed)
There should be a new Ref placed for [facility] for [place]. Thank You!

## 2019-08-11 NOTE — Telephone Encounter (Signed)
  REFERRAL REQUEST Telephone Note 08/11/2019  What type of referral do you need? Behavorial at [facility] health, Intense Out Pt Program  Have you been seen at our office for this problem? Pt states this has been discussed this before was seen by psychiatrist Friday and will have to get short term disability (Advise that they may need an appointment with their PCP before a referral can be done)  Is there a particular doctor or location that you prefer? [place]   Patient notified that referrals can take up to a week or longer to process. If they haven't heard anything within a week they should call back and speak with the referral department.

## 2019-08-11 NOTE — Telephone Encounter (Signed)
Placed referral for the patient. 

## 2019-08-13 ENCOUNTER — Ambulatory Visit (INDEPENDENT_AMBULATORY_CARE_PROVIDER_SITE_OTHER): Payer: Commercial Managed Care - PPO | Admitting: Family Medicine

## 2019-08-13 ENCOUNTER — Other Ambulatory Visit: Payer: Self-pay

## 2019-08-13 ENCOUNTER — Encounter: Payer: Self-pay | Admitting: Family Medicine

## 2019-08-13 VITALS — HR 71 | Temp 96.4°F | Ht 67.0 in | Wt 214.0 lb

## 2019-08-13 DIAGNOSIS — E785 Hyperlipidemia, unspecified: Secondary | ICD-10-CM | POA: Diagnosis not present

## 2019-08-13 DIAGNOSIS — E039 Hypothyroidism, unspecified: Secondary | ICD-10-CM

## 2019-08-13 DIAGNOSIS — K219 Gastro-esophageal reflux disease without esophagitis: Secondary | ICD-10-CM

## 2019-08-13 DIAGNOSIS — E669 Obesity, unspecified: Secondary | ICD-10-CM | POA: Diagnosis not present

## 2019-08-13 DIAGNOSIS — G43009 Migraine without aura, not intractable, without status migrainosus: Secondary | ICD-10-CM

## 2019-08-13 MED ORDER — EMGALITY 120 MG/ML ~~LOC~~ SOAJ: 120.0000 mg | SUBCUTANEOUS | 11 refills | Status: DC

## 2019-08-13 MED ORDER — LEVOTHYROXINE SODIUM 75 MCG PO TABS: 75.0000 ug | ORAL_TABLET | Freq: Every day | ORAL | 3 refills | Status: DC

## 2019-08-13 NOTE — Progress Notes (Signed)
Pulse 71   Temp (!) 96.4 F (35.8 C)   Ht '5\' 7"'  (1.702 m)   Wt 214 lb (97.1 kg)   SpO2 98%   BMI 33.52 kg/m    Subjective:   Patient ID: Grady Lucci, female    DOB: [birth year], 49 y.o.   MRN: [id number]  HPI: Lisaann Atha is a 49 y.o. female presenting on 08/13/2019 for Medical Management of Chronic Issues, Hypothyroidism, Migraine, and Anxiety   HPI Hypothyroidism recheck Patient is coming in for thyroid recheck today as well. They deny any issues with hair changes or heat or cold problems or diarrhea or constipation. They deny any chest pain or palpitations. They are currently on levothyroxine 75 micrograms   Migraines Patient has been doing a lot better on the Emgality for migraines.  She has even stopped the Topamax and propranolol and feels like she is having very infrequent headaches and feels like is going very well.  Patient is coming in to discuss anxiety and depression where she is.  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  She wants referral for intensive outpatient therapy to help her with the recovery and referral was placed a couple days ago during a phone message and phone number was given to her today so she can call and make the appointment.  Patient says she has thoughts of being better off dead and some passing thoughts of suicide but no action plan currently.  She does have a psychiatrist as well.  Relevant past medical, surgical, family and social history reviewed and updated as indicated. Interim medical history since our last visit reviewed. Allergies and medications reviewed and updated.  Review of Systems  Constitutional: Negative for chills and fever.  Eyes: Negative for visual disturbance.  Respiratory: Negative for chest tightness and shortness of breath.   Cardiovascular: Negative for chest pain and leg swelling.  Musculoskeletal: Negative for back pain and  gait problem.  Skin: Negative for rash.  Neurological: Negative for light-headedness and headaches.  Psychiatric/Behavioral: Positive for decreased concentration, dysphoric mood and suicidal ideas. Negative for agitation, behavioral problems, self-injury and sleep disturbance. The patient is nervous/anxious.   All other systems reviewed and are negative.   Per HPI unless specifically indicated above   Allergies as of 08/13/2019      Reactions   Aspirin Other (See Comments)   Upset tummy.  Can take ibuprofen.   Oxycodone-acetaminophen Anxiety   Can take vicodin   Tramadol Hcl Nausea Only      Medication List       Accurate as of August 13, 2019  9:05 AM. If you have any questions, ask your nurse or doctor.        STOP taking these medications   moxifloxacin 400 MG tablet Commonly known as: Avelox Stopped by: Worthy Rancher, MD   predniSONE 20 MG tablet Commonly known as: DELTASONE Stopped by: Fransisca Kaufmann Sophi Calligan, MD   propranolol ER 120 MG 24 hr capsule Commonly known as: INDERAL LA Stopped by: Worthy Rancher, MD   Toviaz 4 MG Tb24 tablet Generic drug: fesoterodine Stopped by: Fransisca Kaufmann Felicia Both, MD     TAKE these medications   albuterol 108 (90 Base) MCG/ACT inhaler Commonly known as: VENTOLIN HFA TAKE 1-2 PUFFS AS NEEDED FOR WHEEZING/CHEST TIGHTNESS   ARIPiprazole 20 MG tablet Commonly known as: ABILIFY Take 20 mg  by mouth daily. What changed: Another medication with the same name was removed. Continue taking this medication, and follow the directions you see here. Changed by: Fransisca Kaufmann Audra Bellard, MD   busPIRone 10 MG tablet Commonly known as: BUSPAR Take 1 tablet (10 mg total) by mouth 2 (two) times daily. (Needs to be seen before next refill)   Emgality 120 MG/ML Soaj Generic drug: Galcanezumab-gnlm Inject 120 mg into the skin every 30 (thirty) days. (Needs to be seen before next refill)   FLUoxetine HCl 60 MG Tabs Take 60 mg by mouth daily.     Fluticasone-Salmeterol 250-50 MCG/DOSE Aepb Commonly known as: ADVAIR Inhale 1 puff into the lungs every 12 (twelve) hours.   HM Vitamin D3 100 MCG (4000 UT) Caps Generic drug: Cholecalciferol Take 4,000 Units by mouth daily.   hydrOXYzine 25 MG capsule Commonly known as: Vistaril Take 1 capsule (25 mg total) by mouth 3 (three) times daily as needed.   levothyroxine 75 MCG tablet Commonly known as: SYNTHROID Take 1 tablet (75 mcg total) by mouth daily.   linaclotide 290 MCG Caps capsule Commonly known as: LINZESS Take 1 capsule (290 mcg total) by mouth daily before breakfast.   mirabegron ER 25 MG Tb24 tablet Commonly known as: Myrbetriq Take 1 tablet (25 mg total) by mouth daily.   multivitamin with minerals Tabs tablet Take 1 tablet by mouth daily.   rizatriptan 10 MG tablet Commonly known as: MAXALT TAKE 1 TABLET BY MOUTH AS NEEDED FOR MIGRAINE. MAY REPEAT IN 2 HOURS IF NEEDED   topiramate 100 MG tablet Commonly known as: TOPAMAX Take 1 tablet (100 mg total) by mouth 2 (two) times daily.        Objective:   Pulse 71   Temp (!) 96.4 F (35.8 C)   Ht '5\' 7"'  (1.702 m)   Wt 214 lb (97.1 kg)   SpO2 98%   BMI 33.52 kg/m   Wt Readings from Last 3 Encounters:  08/13/19 214 lb (97.1 kg)  02/10/19 205 lb (93 kg)  01/22/19 196 lb (88.9 kg)    Physical Exam Vitals and nursing note reviewed.  Constitutional:      General: She is not in acute distress.    Appearance: She is well-developed. She is not diaphoretic.  Eyes:     Conjunctiva/sclera: Conjunctivae normal.     Pupils: Pupils are equal, round, and reactive to light.  Cardiovascular:     Rate and Rhythm: Normal rate and regular rhythm.     Heart sounds: Normal heart sounds. No murmur.  Pulmonary:     Effort: Pulmonary effort is normal. No respiratory distress.     Breath sounds: Normal breath sounds. No wheezing.  Musculoskeletal:        General: No tenderness. Normal range of motion.  Skin:     General: Skin is warm and dry.     Findings: No rash.  Neurological:     Mental Status: She is alert and oriented to person, place, and time.     Coordination: Coordination normal.  Psychiatric:        Mood and Affect: Mood is anxious and depressed.        Behavior: Behavior normal.        Thought Content: Thought content includes suicidal ideation. Thought content does not include suicidal plan.    Patient will go to outpatient psychiatry ASAP and will call for an appointment today.   Assessment & Plan:   Problem List Items Addressed This Visit  Cardiovascular and Mediastinum   Migraine   Relevant Medications   Galcanezumab-gnlm (EMGALITY) 120 MG/ML SOAJ     Digestive   GERD (gastroesophageal reflux disease)   Relevant Orders   CBC with Differential/Platelet   CMP14+EGFR     Endocrine   Hypothyroid   Relevant Medications   levothyroxine (SYNTHROID) 75 MCG tablet   Other Relevant Orders   CMP14+EGFR   TSH     Other   Obesity (BMI 30.0-34.9)   Relevant Orders   CMP14+EGFR   Hyperlipidemia LDL goal <130 - Primary   Relevant Orders   Lipid panel      Continue current medication include Emgality levothyroxine and continue see psychiatry, outpatient intensive therapy and is calling for the number over there for the referral.  Referral is already been placed Follow up plan: Return in about 6 months (around 02/13/2020), or if symptoms worsen or fail to improve, for Thyroid and migraine recheck.  Counseling provided for all of the vaccine components Orders Placed This Encounter  Procedures  . CBC with Differential/Platelet  . CMP14+EGFR  . Lipid panel  . TSH    Caryl Pina, MD [facility] 08/13/2019, 9:05 AM

## 2019-08-14 LAB — LIPID PANEL
Chol/HDL Ratio: 5.2 ratio — ABNORMAL HIGH (ref 0.0–4.4)
Cholesterol, Total: 239 mg/dL — ABNORMAL HIGH (ref 100–199)
HDL: 46 mg/dL (ref >39)
LDL Chol Calc (NIH): 139 mg/dL — ABNORMAL HIGH (ref 0–99)
Triglycerides: 298 mg/dL — ABNORMAL HIGH (ref 0–149)
VLDL Cholesterol Cal: 54 mg/dL — ABNORMAL HIGH (ref 5–40)

## 2019-08-14 LAB — CMP14+EGFR
ALT: 13 IU/L (ref 0–32)
AST: 14 IU/L (ref 0–40)
Albumin/Globulin Ratio: 1.8 (ref 1.2–2.2)
Albumin: 4.4 g/dL (ref 3.8–4.8)
Alkaline Phosphatase: 72 IU/L (ref 39–117)
BUN/Creatinine Ratio: 14 (ref 9–23)
BUN: 13 mg/dL (ref 6–24)
Bilirubin Total: <0.2 mg/dL (ref 0.0–1.2)
CO2: 24 mmol/L (ref 20–29)
Calcium: 9.9 mg/dL (ref 8.7–10.2)
Chloride: 102 mmol/L (ref 96–106)
Creatinine, Ser: 0.95 mg/dL (ref 0.57–1.00)
GFR calc Af Amer: 83 mL/min/{1.73_m2} (ref >59)
GFR calc non Af Amer: 72 mL/min/{1.73_m2} (ref >59)
Globulin, Total: 2.5 g/dL (ref 1.5–4.5)
Glucose: 63 mg/dL — ABNORMAL LOW (ref 65–99)
Potassium: 4.4 mmol/L (ref 3.5–5.2)
Sodium: 140 mmol/L (ref 134–144)
Total Protein: 6.9 g/dL (ref 6.0–8.5)

## 2019-08-14 LAB — CBC WITH DIFFERENTIAL/PLATELET
Basophils Absolute: 0.1 10*3/uL (ref 0.0–0.2)
Basos: 1 %
EOS (ABSOLUTE): 0.1 10*3/uL (ref 0.0–0.4)
Eos: 1 %
Hematocrit: 47.4 % — ABNORMAL HIGH (ref 34.0–46.6)
Hemoglobin: 16.2 g/dL — ABNORMAL HIGH (ref 11.1–15.9)
Immature Grans (Abs): 0.1 10*3/uL (ref 0.0–0.1)
Immature Granulocytes: 1 %
Lymphocytes Absolute: 2 10*3/uL (ref 0.7–3.1)
Lymphs: 25 %
MCH: 30.6 pg (ref 26.6–33.0)
MCHC: 34.2 g/dL (ref 31.5–35.7)
MCV: 90 fL (ref 79–97)
Monocytes Absolute: 0.7 10*3/uL (ref 0.1–0.9)
Monocytes: 8 %
Neutrophils Absolute: 5 10*3/uL (ref 1.4–7.0)
Neutrophils: 64 %
Platelets: 256 10*3/uL (ref 150–450)
RBC: 5.29 x10E6/uL — ABNORMAL HIGH (ref 3.77–5.28)
RDW: 13.3 % (ref 11.7–15.4)
WBC: 7.8 10*3/uL (ref 3.4–10.8)

## 2019-08-14 LAB — TSH: TSH: 2 u[IU]/mL (ref 0.450–4.500)

## 2019-08-18 ENCOUNTER — Telehealth (HOSPITAL_COMMUNITY): Payer: Self-pay | Admitting: Psychiatry

## 2019-08-18 DIAGNOSIS — G4700 Insomnia, unspecified: Secondary | ICD-10-CM | POA: Insufficient documentation

## 2019-08-18 NOTE — Telephone Encounter (Signed)
D:  Pt had phoned and left vm requesting IOP.  A:  Placed call to orient and educate pt about MH-IOP vs. CD-IOP.  Pt will meet with Fulton Reek, LCSW, CSAC on 08-19-19 @ 9 a.m for an assessment/orientation.  Informed pt to bring her insurance card, wear a mask and arrive at 8:30 a.m for paperwork.  R:  Pt receptive.

## 2019-08-19 ENCOUNTER — Ambulatory Visit (INDEPENDENT_AMBULATORY_CARE_PROVIDER_SITE_OTHER): Payer: Commercial Managed Care - PPO | Admitting: Licensed Clinical Social Worker

## 2019-08-19 ENCOUNTER — Other Ambulatory Visit: Payer: Self-pay

## 2019-08-19 DIAGNOSIS — F4310 Post-traumatic stress disorder, unspecified: Secondary | ICD-10-CM

## 2019-08-19 DIAGNOSIS — F1994 Other psychoactive substance use, unspecified with psychoactive substance-induced mood disorder: Secondary | ICD-10-CM

## 2019-08-19 DIAGNOSIS — F1510 Other stimulant abuse, uncomplicated: Secondary | ICD-10-CM | POA: Diagnosis not present

## 2019-08-19 DIAGNOSIS — F1210 Cannabis abuse, uncomplicated: Secondary | ICD-10-CM

## 2019-08-19 DIAGNOSIS — F1110 Opioid abuse, uncomplicated: Secondary | ICD-10-CM

## 2019-08-19 DIAGNOSIS — F1421 Cocaine dependence, in remission: Secondary | ICD-10-CM

## 2019-08-19 DIAGNOSIS — F17200 Nicotine dependence, unspecified, uncomplicated: Secondary | ICD-10-CM

## 2019-08-21 ENCOUNTER — Other Ambulatory Visit: Payer: Self-pay | Admitting: Family Medicine

## 2019-08-21 ENCOUNTER — Ambulatory Visit (HOSPITAL_COMMUNITY): Payer: Medicaid Other | Admitting: Licensed Clinical Social Worker

## 2019-08-21 ENCOUNTER — Other Ambulatory Visit: Payer: Self-pay

## 2019-08-21 ENCOUNTER — Ambulatory Visit: Payer: Commercial Managed Care - PPO | Attending: Internal Medicine

## 2019-08-21 DIAGNOSIS — F410 Panic disorder [episodic paroxysmal anxiety] without agoraphobia: Secondary | ICD-10-CM

## 2019-08-21 DIAGNOSIS — Z23 Encounter for immunization: Secondary | ICD-10-CM

## 2019-08-21 NOTE — Progress Notes (Signed)
   Covid-19 Vaccination Clinic  Name:  Daryan Cagley    MRN: [id number] DOB: 24-Aug-1970  08/21/2019  Ms. Roads was observed post Covid-19 immunization for 15 minutes without incident. She was provided with Vaccine Information Sheet and instruction to access the V-Safe system.   Ms. Mckeon was instructed to call 911 with any severe reactions post vaccine: Marland Kitchen Difficulty breathing  . Swelling of face and throat  . A fast heartbeat  . A bad rash all over body  . Dizziness and weakness   Immunizations Administered    Name Date Dose VIS Date Route   Moderna COVID-19 Vaccine 08/21/2019  9:38 AM 0.5 mL 04/29/2019 Intramuscular   Manufacturer: Moderna   Lot: [id number]   NDC: 73710-626-94
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
## 2019-08-25 ENCOUNTER — Encounter (HOSPITAL_COMMUNITY): Payer: Self-pay | Admitting: Medical

## 2019-08-25 ENCOUNTER — Other Ambulatory Visit (HOSPITAL_COMMUNITY): Payer: Self-pay | Admitting: Medical

## 2019-08-25 ENCOUNTER — Other Ambulatory Visit: Payer: Self-pay

## 2019-08-25 ENCOUNTER — Other Ambulatory Visit (HOSPITAL_COMMUNITY): Payer: Commercial Managed Care - PPO | Attending: Psychiatry | Admitting: Licensed Clinical Social Worker

## 2019-08-25 DIAGNOSIS — F1110 Opioid abuse, uncomplicated: Secondary | ICD-10-CM

## 2019-08-25 DIAGNOSIS — Z915 Personal history of self-harm: Secondary | ICD-10-CM | POA: Insufficient documentation

## 2019-08-25 DIAGNOSIS — R059 Cough, unspecified: Secondary | ICD-10-CM

## 2019-08-25 DIAGNOSIS — K5900 Constipation, unspecified: Secondary | ICD-10-CM

## 2019-08-25 DIAGNOSIS — I10 Essential (primary) hypertension: Secondary | ICD-10-CM | POA: Diagnosis not present

## 2019-08-25 DIAGNOSIS — R450 Nervousness: Secondary | ICD-10-CM | POA: Diagnosis not present

## 2019-08-25 DIAGNOSIS — E669 Obesity, unspecified: Secondary | ICD-10-CM | POA: Insufficient documentation

## 2019-08-25 DIAGNOSIS — Z9889 Other specified postprocedural states: Secondary | ICD-10-CM

## 2019-08-25 DIAGNOSIS — R05 Cough: Secondary | ICD-10-CM

## 2019-08-25 DIAGNOSIS — G43001 Migraine without aura, not intractable, with status migrainosus: Secondary | ICD-10-CM

## 2019-08-25 DIAGNOSIS — F4310 Post-traumatic stress disorder, unspecified: Secondary | ICD-10-CM

## 2019-08-25 DIAGNOSIS — F329 Major depressive disorder, single episode, unspecified: Secondary | ICD-10-CM | POA: Insufficient documentation

## 2019-08-25 DIAGNOSIS — E039 Hypothyroidism, unspecified: Secondary | ICD-10-CM | POA: Insufficient documentation

## 2019-08-25 DIAGNOSIS — R45851 Suicidal ideations: Secondary | ICD-10-CM | POA: Diagnosis not present

## 2019-08-25 DIAGNOSIS — Z6830 Body mass index (BMI) 30.0-30.9, adult: Secondary | ICD-10-CM | POA: Diagnosis not present

## 2019-08-25 DIAGNOSIS — G43009 Migraine without aura, not intractable, without status migrainosus: Secondary | ICD-10-CM

## 2019-08-25 DIAGNOSIS — F1994 Other psychoactive substance use, unspecified with psychoactive substance-induced mood disorder: Secondary | ICD-10-CM

## 2019-08-25 DIAGNOSIS — Z8659 Personal history of other mental and behavioral disorders: Secondary | ICD-10-CM | POA: Insufficient documentation

## 2019-08-25 DIAGNOSIS — Z79899 Other long term (current) drug therapy: Secondary | ICD-10-CM | POA: Diagnosis not present

## 2019-08-25 DIAGNOSIS — F1421 Cocaine dependence, in remission: Secondary | ICD-10-CM

## 2019-08-25 DIAGNOSIS — F17200 Nicotine dependence, unspecified, uncomplicated: Secondary | ICD-10-CM

## 2019-08-25 DIAGNOSIS — F419 Anxiety disorder, unspecified: Secondary | ICD-10-CM | POA: Diagnosis not present

## 2019-08-25 DIAGNOSIS — R062 Wheezing: Secondary | ICD-10-CM

## 2019-08-25 DIAGNOSIS — F5104 Psychophysiologic insomnia: Secondary | ICD-10-CM | POA: Insufficient documentation

## 2019-08-25 DIAGNOSIS — E66811 Obesity, class 1: Secondary | ICD-10-CM

## 2019-08-25 DIAGNOSIS — F1210 Cannabis abuse, uncomplicated: Secondary | ICD-10-CM

## 2019-08-25 DIAGNOSIS — G4730 Sleep apnea, unspecified: Secondary | ICD-10-CM | POA: Insufficient documentation

## 2019-08-25 DIAGNOSIS — G4733 Obstructive sleep apnea (adult) (pediatric): Secondary | ICD-10-CM

## 2019-08-25 DIAGNOSIS — F17210 Nicotine dependence, cigarettes, uncomplicated: Secondary | ICD-10-CM | POA: Insufficient documentation

## 2019-08-25 DIAGNOSIS — J40 Bronchitis, not specified as acute or chronic: Secondary | ICD-10-CM

## 2019-08-25 DIAGNOSIS — F1920 Other psychoactive substance dependence, uncomplicated: Secondary | ICD-10-CM

## 2019-08-25 DIAGNOSIS — F410 Panic disorder [episodic paroxysmal anxiety] without agoraphobia: Secondary | ICD-10-CM | POA: Diagnosis not present

## 2019-08-25 DIAGNOSIS — R451 Restlessness and agitation: Secondary | ICD-10-CM | POA: Insufficient documentation

## 2019-08-25 DIAGNOSIS — F19980 Other psychoactive substance use, unspecified with psychoactive substance-induced anxiety disorder: Secondary | ICD-10-CM

## 2019-08-25 DIAGNOSIS — F1510 Other stimulant abuse, uncomplicated: Secondary | ICD-10-CM

## 2019-08-25 DIAGNOSIS — Z90710 Acquired absence of both cervix and uterus: Secondary | ICD-10-CM

## 2019-08-25 HISTORY — DX: Personal history of other mental and behavioral disorders: Z86.59

## 2019-08-25 HISTORY — DX: Other psychoactive substance dependence, uncomplicated: F19.20

## 2019-08-25 MED ORDER — NALTREXONE HCL 50 MG PO TABS: 50.0000 mg | ORAL_TABLET | Freq: Every day | ORAL | 0 refills | Status: AC

## 2019-08-25 MED ORDER — BACLOFEN 10 MG PO TABS: 10.0000 mg | ORAL_TABLET | Freq: Three times a day (TID) | ORAL | 1 refills | Status: DC
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
## 2019-08-27 ENCOUNTER — Other Ambulatory Visit: Payer: Self-pay

## 2019-08-27 ENCOUNTER — Other Ambulatory Visit (HOSPITAL_COMMUNITY): Payer: Commercial Managed Care - PPO | Admitting: Licensed Clinical Social Worker

## 2019-08-27 DIAGNOSIS — F1421 Cocaine dependence, in remission: Secondary | ICD-10-CM

## 2019-08-27 DIAGNOSIS — F1510 Other stimulant abuse, uncomplicated: Secondary | ICD-10-CM

## 2019-08-27 DIAGNOSIS — F329 Major depressive disorder, single episode, unspecified: Secondary | ICD-10-CM | POA: Diagnosis not present

## 2019-08-27 DIAGNOSIS — F1920 Other psychoactive substance dependence, uncomplicated: Secondary | ICD-10-CM

## 2019-08-27 DIAGNOSIS — F1110 Opioid abuse, uncomplicated: Secondary | ICD-10-CM

## 2019-08-27 DIAGNOSIS — F1994 Other psychoactive substance use, unspecified with psychoactive substance-induced mood disorder: Secondary | ICD-10-CM

## 2019-08-28 ENCOUNTER — Other Ambulatory Visit (HOSPITAL_COMMUNITY): Payer: Commercial Managed Care - PPO | Attending: Psychiatry | Admitting: Licensed Clinical Social Worker

## 2019-08-28 ENCOUNTER — Other Ambulatory Visit: Payer: Self-pay

## 2019-08-28 DIAGNOSIS — F319 Bipolar disorder, unspecified: Secondary | ICD-10-CM | POA: Diagnosis not present

## 2019-08-28 DIAGNOSIS — F1920 Other psychoactive substance dependence, uncomplicated: Secondary | ICD-10-CM

## 2019-08-28 DIAGNOSIS — G4733 Obstructive sleep apnea (adult) (pediatric): Secondary | ICD-10-CM | POA: Insufficient documentation

## 2019-08-28 DIAGNOSIS — Z79899 Other long term (current) drug therapy: Secondary | ICD-10-CM | POA: Diagnosis not present

## 2019-08-28 DIAGNOSIS — F5104 Psychophysiologic insomnia: Secondary | ICD-10-CM | POA: Insufficient documentation

## 2019-08-28 DIAGNOSIS — F1210 Cannabis abuse, uncomplicated: Secondary | ICD-10-CM

## 2019-08-28 DIAGNOSIS — F1110 Opioid abuse, uncomplicated: Secondary | ICD-10-CM

## 2019-08-28 DIAGNOSIS — Z7951 Long term (current) use of inhaled steroids: Secondary | ICD-10-CM | POA: Diagnosis not present

## 2019-08-28 DIAGNOSIS — F4312 Post-traumatic stress disorder, chronic: Secondary | ICD-10-CM | POA: Diagnosis not present

## 2019-08-28 DIAGNOSIS — F1924 Other psychoactive substance dependence with psychoactive substance-induced mood disorder: Secondary | ICD-10-CM | POA: Diagnosis not present

## 2019-08-28 DIAGNOSIS — E039 Hypothyroidism, unspecified: Secondary | ICD-10-CM | POA: Insufficient documentation

## 2019-08-28 DIAGNOSIS — Z6834 Body mass index (BMI) 34.0-34.9, adult: Secondary | ICD-10-CM | POA: Insufficient documentation

## 2019-08-28 DIAGNOSIS — F1510 Other stimulant abuse, uncomplicated: Secondary | ICD-10-CM

## 2019-08-28 DIAGNOSIS — Z9119 Patient's noncompliance with other medical treatment and regimen: Secondary | ICD-10-CM | POA: Diagnosis not present

## 2019-08-28 DIAGNOSIS — G43009 Migraine without aura, not intractable, without status migrainosus: Secondary | ICD-10-CM | POA: Diagnosis not present

## 2019-08-28 DIAGNOSIS — F1421 Cocaine dependence, in remission: Secondary | ICD-10-CM

## 2019-08-28 DIAGNOSIS — F17200 Nicotine dependence, unspecified, uncomplicated: Secondary | ICD-10-CM | POA: Insufficient documentation

## 2019-08-28 DIAGNOSIS — E669 Obesity, unspecified: Secondary | ICD-10-CM | POA: Insufficient documentation

## 2019-08-28 DIAGNOSIS — Z8659 Personal history of other mental and behavioral disorders: Secondary | ICD-10-CM

## 2019-08-28 DIAGNOSIS — Z79890 Hormone replacement therapy (postmenopausal): Secondary | ICD-10-CM | POA: Insufficient documentation

## 2019-08-28 DIAGNOSIS — F329 Major depressive disorder, single episode, unspecified: Secondary | ICD-10-CM | POA: Diagnosis present

## 2019-08-28 DIAGNOSIS — F1994 Other psychoactive substance use, unspecified with psychoactive substance-induced mood disorder: Secondary | ICD-10-CM

## 2019-08-28 DIAGNOSIS — F19980 Other psychoactive substance use, unspecified with psychoactive substance-induced anxiety disorder: Secondary | ICD-10-CM

## 2019-08-28 NOTE — Progress Notes (Signed)
    Daily Group Progress Note  Program: CD-IOP  Group Time: 1-2:30pm   Participation Level: Active   Behavioral Response: Appropriate   Type of Therapy: Process Group   Topic: Clinician checked in with group members, assessing for SI/HI/psychosis and overall level of functioning, including cravings, relapse, and participation in community support meetings. Clinician and group members processed 'highs' and 'lows' since last group and how challenges effected their thoughts or behaviors. Clinician and group members read and processed AA Daily Reflection related to hopefulness during sobriety.     Group Time: 2:30pm-4pm   Participation Level: Active   Behavioral Response: Appropriate   Type of Therapy: Psycho-education Group   Topic: Clinician provided psycho-educational handout 'Cognitive Distortions' which provided definition and examples of various distorted thought patterns. Clinician reviewed handout and prompted members to share thoughts and any relatable experiences to personal distorted thoughts. Clinician provided examples of means to challenge unhelpful thinking patterns and presented additional handout titled 'Challenging Negative Thoughts'. Clinician facilitated further discussion on ways that members can challenge thoughts to achieve healthier feelings and behaviors. Clinician provided active listening, summarizing statements and validated client feelings. Clinician assessed for 1 self-care activity prior to next group meeting.   Summary: Client presented alert and oriented x5 for today's session and identified sobriety date of 08/25/19. Client reported attending 0 support meetings since last group session. Client related personal experiences to fellow members throughout session and validated responses. Client spent time reviewing presented material and reported that she often finds herself engaging in catastrophizing. Client actively engaged in further discussion and verbalized  understanding of CBT triangle. Client identified a self-care activity of attending a support meeting this afternoon.   Family Program: Family present? No   Name of family member(s): N/A  UDS collected: Yes Results: pending  AA/NA attended?: No  Sponsor?: No   Francine Graven, LCSW

## 2019-08-29 NOTE — Progress Notes (Signed)
    Daily Group Progress Note  Program: CD-IOP  Group Time: 1-2:30pm   Participation Level: Active   Behavioral Response: Appropriate   Type of Therapy: Process Group   Topic: Clinician checked in with group members, assessing for SI/HI/psychosis and overall level of functioning, including cravings, relapse, and participation in community support meetings. Clinician and group members processed 'highs' and 'lows' since last group and how challenges effected their thoughts or behaviors. Clinician and group members read Daily Reflection focused on 'Looking Within' and identifying and correcting emotional or character defects. Clinician and group members discussed resources to engaging in AA or NA program.     Group Time: 2:30pm-4pm   Participation Level: Active   Behavioral Response: Appropriate   Type of Therapy: Psycho-education Group   Topic: Psychoeducational group facilitated by Director of Health and Wellness, [place], focused on healthy habits to improve overall functioning. Asher Muir educated group members on the importance of healthy sleep, nutritional, and exercise  routines regarding managing stress. Jamie allotted time for questions and feedback as well as facilitated activity in which members were encouraged to rate various dimensions of functioning and consider steps they intend to take in order to improve these areas of health. Clinician facilitated check-out by assessing for 1 self-care activity that members will engage in prior to next group session.   Summary: Marua maintained a sobriety date of 08/25/19 with no attended meetings. Client shows progress toward goals AEB identified cognitive distortions for previous days play out in her life, such as all or nothing thinking with eating, spending money, or using. Client reported decrease in cravings, physically and mentally, helping make good decisions. Client continues journaling, reporting helpful having a way to check in with  herself in morning and evening. Client has a goal to set a good example of recovery for her daughter. Client discussed physical exercise and motivation to begin working out as a means to improve health and wellness. Client left for a scheduled appointment prior to check out.    Family Program: Family present? No   Name of family member(s): N/A  UDS collected: Yes Results: pending  AA/NA attended?: No  Sponsor?: No   Francine Graven, LCSW

## 2019-09-01 ENCOUNTER — Other Ambulatory Visit: Payer: Self-pay

## 2019-09-01 ENCOUNTER — Other Ambulatory Visit (HOSPITAL_COMMUNITY): Payer: Commercial Managed Care - PPO | Admitting: Medical

## 2019-09-01 NOTE — Progress Notes (Signed)
    Daily Group Progress Note  Program: CD-IOP   Group Time: 1pm-2:30pm Participation Level: Active Behavioral Response: Appropriate Type of Therapy: Process Group Topic:Clinician checked in with group members, assessing for SI/HI/psychosis and overall level of functioning including triggers, cravings, or relapse. Clinician and group members reviewed sobriety date, community meetings attended, and positive interactions as well as interactions which could have gone better and how they were addressed. Clinician facilitated process discussion on identifying and responding to early signs of relapse, such as changes in behavior patterns and working the program less or isolating more. Group members discussed making decisions out of fear and how fear and anxiety effect recovery. Clinician normalized emotions and provided 5 senses grounding activity to increase mindfulness of present moment.   Group Time: 2:30pm-4pm Participation Level: Active Behavioral Response: Appropriate Type of Therapy: Psycho-education Group Topic: Clinician reviewed DBT Mindfulness skills 'WHAT' and 'HOW,' discussing the importance of describing events, thoughts, and feelings in a non-judgmental manner. Clinician facilitated Laughter Yoga exercise in session, encouraging clients to participate non-judgmentally. Clinician allowed time for clients to provide feedback about skill.   Summary: Sobriety date 08/25/19, 3 meetings attended since individual session previous week. Client reports use over the weekend when daughter brought marijuana into the home. Client notes wanting to be a better example for her children. Client shows progress toward goals AEB engaging in activity despite discomfort, and engaging in conversations with group about triggers for relapse.   Family Program: Family present? No   Name of family member(s): NA  UDS collected: Yes Results: marijuana, reported prior to UDS  AA/NA attended?: Yes  Sponsor?:  No   Rien Marland A Evalyn Shultis, LCSW, LCAS

## 2019-09-03 ENCOUNTER — Other Ambulatory Visit: Payer: Self-pay

## 2019-09-03 ENCOUNTER — Other Ambulatory Visit (HOSPITAL_COMMUNITY): Payer: Commercial Managed Care - PPO | Admitting: Licensed Clinical Social Worker

## 2019-09-03 ENCOUNTER — Encounter (HOSPITAL_COMMUNITY): Payer: Self-pay | Admitting: Medical

## 2019-09-03 DIAGNOSIS — F1994 Other psychoactive substance use, unspecified with psychoactive substance-induced mood disorder: Secondary | ICD-10-CM

## 2019-09-03 DIAGNOSIS — F1920 Other psychoactive substance dependence, uncomplicated: Secondary | ICD-10-CM

## 2019-09-03 DIAGNOSIS — F1924 Other psychoactive substance dependence with psychoactive substance-induced mood disorder: Secondary | ICD-10-CM | POA: Diagnosis not present

## 2019-09-03 DIAGNOSIS — Z8659 Personal history of other mental and behavioral disorders: Secondary | ICD-10-CM

## 2019-09-03 DIAGNOSIS — F19980 Other psychoactive substance use, unspecified with psychoactive substance-induced anxiety disorder: Secondary | ICD-10-CM

## 2019-09-03 DIAGNOSIS — F4312 Post-traumatic stress disorder, chronic: Secondary | ICD-10-CM

## 2019-09-03 DIAGNOSIS — E039 Hypothyroidism, unspecified: Secondary | ICD-10-CM

## 2019-09-03 DIAGNOSIS — G4733 Obstructive sleep apnea (adult) (pediatric): Secondary | ICD-10-CM

## 2019-09-03 DIAGNOSIS — G43001 Migraine without aura, not intractable, with status migrainosus: Secondary | ICD-10-CM

## 2019-09-03 DIAGNOSIS — F17200 Nicotine dependence, unspecified, uncomplicated: Secondary | ICD-10-CM

## 2019-09-03 DIAGNOSIS — Z90710 Acquired absence of both cervix and uterus: Secondary | ICD-10-CM

## 2019-09-03 DIAGNOSIS — F5104 Psychophysiologic insomnia: Secondary | ICD-10-CM

## 2019-09-03 DIAGNOSIS — Z9889 Other specified postprocedural states: Secondary | ICD-10-CM

## 2019-09-03 DIAGNOSIS — E669 Obesity, unspecified: Secondary | ICD-10-CM

## 2019-09-03 NOTE — Progress Notes (Signed)
   [facility] Follow-up Outpatient CDIOP Date: 08/03/2019  Admission Date:08/25/2019  Sobriety date: 08/18/2019  Subjective: " The medication has really helped-I dont feel like I want to use"  HPI : CD IOP Initial provider FU  Pt is seen for first FU S/P entry into CD IOP. She reports an excellent anticraving response To the combination of Baclofen and Naltrexone.  Review of Systems: Psychiatric: Agitation: Craving improved Hallucination: No Depressed Mood: Taking Prozac 60 mg/Abilify 20 mg-has SIMD Insomnia: Chronic/has sleep apnea as well Hypersomnia: No Altered Concentration: Several days Feels Worthless: Everyday when using Grandiose Ideas: No Belief In Special Powers: No New/Increased Substance Abuse: No Compulsions: Cravings improved with MAT  Neurologic: Headache: No Seizure: No Paresthesias: No  Current Medications: albuterol 108 (90 Base) MCG/ACT inhaler Commonly known as: VENTOLIN HFA TAKE 1-2 PUFFS AS NEEDED FOR WHEEZING/CHEST TIGHTNESS  ARIPiprazole 20 MG tablet Commonly known as: ABILIFY Take by mouth.  baclofen 10 MG tablet Commonly known as: LIORESAL Take 1 tablet (10 mg total) by mouth 3 (three) times daily.  busPIRone 10 MG tablet Commonly known as: BUSPAR Take 1 tablet (10 mg total) by mouth 2 (two) times daily.  Emgality 120 MG/ML Soaj Generic drug: Galcanezumab-gnlm Inject into the skin.  FLUoxetine HCl 60 MG Tabs Take 60 mg by mouth daily.  Fluticasone-Salmeterol 250-50 MCG/DOSE Aepb Commonly known as: ADVAIR Inhale 1 puff into the lungs every 12 (twelve) hours.  HM Vitamin D3 100 MCG (4000 UT) Caps Generic drug: Cholecalciferol Take 4,000 Units by mouth daily.  levothyroxine 75 MCG tablet Commonly known as: SYNTHROID Take by mouth.  linaclotide 290 MCG Caps capsule Commonly known as: LINZESS Take 1 capsule (290 mcg total) by mouth daily before breakfast.  mirabegron ER 25 MG Tb24 tablet Commonly known as: Myrbetriq Take 1 tablet (25  mg total) by mouth daily.  multivitamin with minerals Tabs tablet Take 1 tablet by mouth daily.  naltrexone 50 MG tablet Commonly known as: DEPADE Take 1 tablet (50 mg total) by mouth daily.  rizatriptan 10 MG tablet Commonly known as: MAXALT TAKE 1 TABLET BY MOUTH AS NEEDED FOR MIGRAINE. MAY REPEAT IN 2 HOURS IF NEEDED    Mental Status Examination  Appearance: Alert: Yes Attention: good  Cooperative: Yes Eye Contact: Good Speech: Clear and coherent Psychomotor Activity: Normal Memory/Concentration: Normal/intact Oriented: person, place, time/date and situation Mood: Some anxiety Affect: Appropriate and Congruent Thought Processes and Associations: Coherent and Intact Fund of Knowledge: WDL Thought Content: WDL Insight: Present Judgement: Impaired  UDS: 08/25/2019 Prozac/THC/Nicotine  Diagnosis:  0 Polysubstance dependence including opioid type drug with complication, episodic abuse (HCC) 0 Chronic post-traumatic stress disorder 0 Substance induced mood disorder (HCC) 0 Substance or medication-induced anxiety disorder (HCC) 0 History of bipolar disorder 0 Psychophysiological insomnia 0 Obstructive sleep apnea syndrome 0 Acquired hypothyroidism 0 Migraine without aura and with status migrainosus, not intractable 0 Obesity (BMI 30.0-34.9) 0 S/P gastroplasty 0 S/P total hysterectomy 0 Tobacco use disorder  Assessment: Beginning her 2nd IOP program.Now 16 days from last use with cravings responding to MAT .Stable   Treatment Plan:Per admission. FU 2 weeks-sooner if needed Pam Morn, PA-CPatient ID: Pam Chen, female   DOB: 1971/05/17, 49 y.o.   MRN: [id number]

## 2019-09-04 ENCOUNTER — Other Ambulatory Visit (HOSPITAL_COMMUNITY): Payer: Commercial Managed Care - PPO

## 2019-09-04 ENCOUNTER — Other Ambulatory Visit: Payer: Self-pay

## 2019-09-08 ENCOUNTER — Encounter (HOSPITAL_COMMUNITY): Payer: Medicaid Other

## 2019-09-10 ENCOUNTER — Encounter (HOSPITAL_COMMUNITY): Payer: Self-pay | Admitting: Medical

## 2019-09-10 ENCOUNTER — Other Ambulatory Visit (INDEPENDENT_AMBULATORY_CARE_PROVIDER_SITE_OTHER): Payer: Commercial Managed Care - PPO | Admitting: Medical

## 2019-09-10 ENCOUNTER — Other Ambulatory Visit: Payer: Self-pay

## 2019-09-10 ENCOUNTER — Telehealth (HOSPITAL_COMMUNITY): Payer: Self-pay | Admitting: Licensed Clinical Social Worker

## 2019-09-10 DIAGNOSIS — F5104 Psychophysiologic insomnia: Secondary | ICD-10-CM

## 2019-09-10 DIAGNOSIS — F17200 Nicotine dependence, unspecified, uncomplicated: Secondary | ICD-10-CM

## 2019-09-10 DIAGNOSIS — Z9111 Patient's noncompliance with dietary regimen: Secondary | ICD-10-CM

## 2019-09-10 DIAGNOSIS — G43001 Migraine without aura, not intractable, with status migrainosus: Secondary | ICD-10-CM

## 2019-09-10 DIAGNOSIS — Z5329 Procedure and treatment not carried out because of patient's decision for other reasons: Secondary | ICD-10-CM

## 2019-09-10 DIAGNOSIS — E669 Obesity, unspecified: Secondary | ICD-10-CM

## 2019-09-10 DIAGNOSIS — F4312 Post-traumatic stress disorder, chronic: Secondary | ICD-10-CM

## 2019-09-10 DIAGNOSIS — F19980 Other psychoactive substance use, unspecified with psychoactive substance-induced anxiety disorder: Secondary | ICD-10-CM

## 2019-09-10 DIAGNOSIS — Z9889 Other specified postprocedural states: Secondary | ICD-10-CM

## 2019-09-10 DIAGNOSIS — F1994 Other psychoactive substance use, unspecified with psychoactive substance-induced mood disorder: Secondary | ICD-10-CM

## 2019-09-10 DIAGNOSIS — E039 Hypothyroidism, unspecified: Secondary | ICD-10-CM

## 2019-09-10 DIAGNOSIS — G4733 Obstructive sleep apnea (adult) (pediatric): Secondary | ICD-10-CM

## 2019-09-10 DIAGNOSIS — Z90710 Acquired absence of both cervix and uterus: Secondary | ICD-10-CM

## 2019-09-10 DIAGNOSIS — F1920 Other psychoactive substance dependence, uncomplicated: Secondary | ICD-10-CM

## 2019-09-10 DIAGNOSIS — F1110 Opioid abuse, uncomplicated: Secondary | ICD-10-CM

## 2019-09-10 DIAGNOSIS — Z8659 Personal history of other mental and behavioral disorders: Secondary | ICD-10-CM

## 2019-09-10 NOTE — Progress Notes (Addendum)
[facility]                                                      Discharge Summary                                                                                                                                                                     Date of Admission: 08/25/2019 Referall Provider:[name], Fransisca Kaufmann, MD [facility] Date of Discharge: 09/10/2019 Sobriety Date:None Admission Diagnosis: CM     1. Polysubstance dependence including opioid type drug with complication, episodic abuse ([place])  F19.20    Cocaine/Methamphetamine/Opiates/THC/Nicotene  2. Current every day smoker  F17.200   3. History of bipolar disorder  Z86.59    ? Substance induced  4. Substance induced mood disorder (HCC)  F19.94   5. Substance or medication-induced anxiety disorder (HCC)  F19.980   6. Acquired hypothyroidism  E03.9   7. Psychophysiological insomnia  F51.04   8. Migraine without aura and with status migrainosus, not intractable  G43.001   9. Obstructive sleep apnea syndrome  G47.33   10. Obesity (BMI 30.0-34.9)  E66.9   11. Bronchitis  J40   12. Panic attacks  F41.0   13. Cough  R05   14. Wheezing  R06.2   15. Constipation, unspecified constipation type  K59.00   16. Migraine without aura and without status migrainosus, not intractable  G43.009   17. S/P gastroplasty  Z98.890   18. S/P total hysterectomy  Z90.710     Course of Treatment: Patient failed to attend last 3 consecutive groups without calling/excuse. When Counselor was finally able tp speak with her she said she wasnt motivated to stop smoking pot/try abstinence.She withdrew herself from recommended treatment.  Medications: albuterol 108 (90 Base) MCG/ACT inhaler Commonly  known as: VENTOLIN HFA TAKE  1-2 PUFFS AS NEEDED FOR WHEEZING/CHEST TIGHTNESS  ARIPiprazole 20 MG tablet Commonly known as: ABILIFY Take by mouth.  baclofen 10 MG tablet Commonly known as: LIORESAL Take 1 tablet (10 mg total) by mouth 3 (three) times daily.  busPIRone 10 MG tablet Commonly known as: BUSPAR Take 1 tablet (10 mg total) by mouth 2 (two) times daily.  Emgality 120 MG/ML Soaj Generic drug: Galcanezumab-gnlm Inject into the skin.  FLUoxetine HCl 60 MG Tabs Take 60 mg by mouth daily.  Fluticasone-Salmeterol 250-50 MCG/DOSE Aepb Commonly known as: ADVAIR Inhale 1 puff into the lungs every 12 (twelve) hours.  HM Vitamin D3 100 MCG (4000 UT) Caps Generic drug: Cholecalciferol Take 4,000 Units by mouth daily.  levothyroxine 75 MCG tablet Commonly known as: SYNTHROID Take by mouth.  linaclotide 290 MCG Caps capsule Commonly known as: LINZESS Take 1 capsule (290 mcg total) by mouth daily before breakfast.  mirabegron ER 25 MG Tb24 tablet Commonly known as: Myrbetriq Take 1 tablet (25 mg total) by mouth daily.  multivitamin with minerals Tabs tablet Take 1 tablet by mouth daily.  naltrexone 50 MG tablet Commonly known as: DEPADE Take 1 tablet (50 mg total) by mouth daily.  ondansetron 4 MG disintegrating tablet Commonly known as: ZOFRAN-ODT Take 4 mg by mouth every 8 (eight) hours as needed.  rizatriptan 10 MG tablet Commonly known as: MAXALT TAKE 1 TABLET BY MOUTH AS NEEDED FOR MIGRAINE. MAY REPEAT IN 2 HOURS IF NEEDED    Discharge Diagnosis: 0 Noncompliance with treatment plan 0 Failure to attend appointment 0 Polysubstance dependence including opioid type drug with complication, episodic abuse (HCC) 0 Opiate abuse, episodic (HCC) 0 Chronic post-traumatic stress disorder 0 Substance induced mood disorder (HCC) 0 Substance or medication-induced anxiety disorder (HCC) 0 History of bipolar disorder 0 Psychophysiological insomnia 0 Acquired hypothyroidism 0 Obstructive sleep  apnea syndrome 0 Migraine without aura and with status migrainosus, not intractable 0 Obesity (BMI 30.0-34.9) 0 S/P gastroplasty 0 S/P total hysterectomy 0 Current every day smoker  Plan of Action to Address Continuing Problems:  Goals and Activities to Help Maintain Sobriety: 1. Stay away from people ,places and things that are triggers 2. Continue practicing Fair Fighting rules in interpersonal conflicts. 3. Continue alcohol and drug refusal skills and call on support system  4. Attend AA/NA meetings AT LEAST as often as you use  5. Obtain a sponsor and a home group in AA/NA. 6. Return to outside providers-PCP and Psychiatry  Referrals:[facility] recommended  Next appointment: Pt to schedule  Prognosis:Poor    Client has NOT participated in the development of this discharge plan and can receive a copy of this completed plan  , female   DOB: 1970-10-01, 49 y.o.   MRN: [id number]

## 2019-09-11 ENCOUNTER — Encounter (HOSPITAL_COMMUNITY): Payer: Self-pay | Admitting: Medical

## 2019-09-11 ENCOUNTER — Other Ambulatory Visit: Payer: Self-pay

## 2019-09-11 ENCOUNTER — Other Ambulatory Visit (HOSPITAL_COMMUNITY): Payer: Commercial Managed Care - PPO

## 2019-09-11 NOTE — Progress Notes (Signed)
    Daily Group Progress Note  Program: CD-IOP   Group Time: 1pm-2:30pm Participation Level: Active Behavioral Response: Appropriate Type of Therapy: Process Group Topic: Clinician and group members checked in with sobriety date, positive events and events which could have gone better since last group and additionally any challenges to their recovery. Clinician utilize summarizing and reflective statements and praised group engagement and vulnerability with stories. Clinician and group members processed a group member walking out after being re-directed multiple times for inappropriate behaviors and related thoughts and feelings. Clinician and group members read and discussed AA daily reflection and JFT meditation.    Group Time: 2:30pm-4pm Participation Level: Active Behavioral Response: Appropriate Type of Therapy: Psycho-education Group Topic: Clinician provided psycho-educational information on identifying, setting, and maintaining boundaries. Clinician and group members reviewed types of boundaries, personality types and their response to boundaries and examples of how to set boundaries. Clinician and group members role played in session verbalizing boundaries and maintaining when challenged.     Summary: Jesse maintained sobriety date, reporting improvement in relationship with daughter who is respecting boundaries of not using substances around her. Client shared about struggle maintaining boundaries and upcoming events which she might struggle to maintain boundaries. Client was able to identify myths which felt like facts based on feelings and identify the healthy thought and behavior. Client acknowledged the need to engage in AA or NA meetings and shows progress toward goals AEB accepting responsibility for lack of attendance and wants vs needs.    Family Program: Family present? No   Name of family member(s): NA  UDS collected: No Results: pending  AA/NA attended?: Yes  Sponsor?: No   Harlon Ditty, LCSW

## 2019-09-15 ENCOUNTER — Encounter (HOSPITAL_COMMUNITY): Payer: Medicaid Other

## 2019-09-16 ENCOUNTER — Other Ambulatory Visit (HOSPITAL_COMMUNITY): Payer: Self-pay | Admitting: Medical

## 2019-09-17 ENCOUNTER — Encounter (HOSPITAL_COMMUNITY): Payer: Medicaid Other

## 2019-09-18 ENCOUNTER — Encounter (HOSPITAL_COMMUNITY): Payer: Medicaid Other

## 2019-09-19 ENCOUNTER — Encounter: Payer: Self-pay | Admitting: Family Medicine

## 2019-09-19 ENCOUNTER — Telehealth (INDEPENDENT_AMBULATORY_CARE_PROVIDER_SITE_OTHER): Payer: Commercial Managed Care - PPO | Admitting: Family Medicine

## 2019-09-19 DIAGNOSIS — H9201 Otalgia, right ear: Secondary | ICD-10-CM

## 2019-09-19 DIAGNOSIS — B370 Candidal stomatitis: Secondary | ICD-10-CM

## 2019-09-19 MED ORDER — CIPROFLOXACIN-DEXAMETHASONE 0.3-0.1 % OT SUSP: 4.0000 [drp] | Freq: Two times a day (BID) | OTIC | 0 refills | Status: AC

## 2019-09-19 MED ORDER — NYSTATIN 100000 UNIT/ML MT SUSP: 5.0000 mL | Freq: Four times a day (QID) | OROMUCOSAL | 0 refills | Status: DC

## 2019-09-19 NOTE — Progress Notes (Signed)
Telephone visit  Subjective: Pam Chen, ear pain PCP: [name], [name], MD AQT:MAUQJ Chen is a 49 y.o. female. Patient provides verbal consent for consult held via phone (could not do video due to technical difficulties).  Due to COVID-19 pandemic this visit was conducted virtually. This visit type was conducted due to national recommendations for restrictions regarding the COVID-19 Pandemic (e.g. social distancing, sheltering in place) in an effort to limit this patient's exposure and mitigate transmission in our community. All issues noted in this document were discussed and addressed.  A physical exam was not performed with this format.   Location of patient: home Location of provider: [facility] Others present for call: none  1. Thrush Patient reports several day history of spots on her tongue and underneath her tongue that are white and irritated.  She has been having recurrent thrush for some reason over the last couple of months when she was using antibiotics for various URIs.  She has been rinsing her mouth after Advair which she uses twice daily.  She used nystatin which resolved her symptoms after a couple of days but would like to see if she get a refill since this has occurred.  2. Otalgia Patient reports right sided ear pain that has been ongoing 3-4 days.  She is hearing normally.  She has been putting cotton in it which has helped it feel better.  She does not take antihistamines.  Denies any drainage.  No fevers.   ROS: Per HPI  Allergies  Allergen Reactions  . Aspirin Other (See Comments)    Upset tummy.  Can take ibuprofen.  . Oxycodone-Acetaminophen Anxiety    Can take vicodin  . Tramadol Hcl Nausea Only   Past Medical History:  Diagnosis Date  . Arthritis   . Current every day smoker 03/07/2017  . Diverticulitis   . GERD (gastroesophageal reflux disease)   . 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 History of  adenomatous polyp of colon 05/02/2016   Adenoma 05/02/16; repeat 04/2021; Barbaraann Share, MD [facility])  . History of bipolar disorder 08/25/2019  . History of partial colectomy 03/28/2016   diverticulitis  . Hypertension   . Hypothyroid 02/02/2017  . Kidney stones   . Migraine   . Obesity (BMI 30.0-34.9) 02/02/2017  . Polysubstance dependence including opioid type drug with complication, episodic abuse (HCC) 08/25/2019  . Rotator cuff injury   . Sleep apnea    CPAP  . Tennis elbow   . Thyroid disease     Current Outpatient Medications:  .  albuterol (VENTOLIN HFA) 108 (90 Base) MCG/ACT inhaler, TAKE 1-2 PUFFS AS NEEDED FOR WHEEZING/CHEST TIGHTNESS, Disp: 18 g, Rfl: 1 .  ARIPiprazole (ABILIFY) 20 MG tablet, Take by mouth., Disp: , Rfl:  .  baclofen (LIORESAL) 10 MG tablet, Take 1 tablet (10 mg total) by mouth 3 (three) times daily., Disp: 90 tablet, Rfl: 1 .  busPIRone (BUSPAR) 10 MG tablet, Take 1 tablet (10 mg total) by mouth 2 (two) times daily., Disp: 180 tablet, Rfl: 0 .  Cholecalciferol (HM VITAMIN D3) 4000 units CAPS, Take 4,000 Units by mouth daily., Disp: , Rfl:  .  FLUoxetine HCl 60 MG TABS, Take 60 mg by mouth daily., Disp: , Rfl:  .  Fluticasone-Salmeterol (ADVAIR) 250-50 MCG/DOSE AEPB, Inhale 1 puff into the lungs every 12 (twelve) hours., Disp: 60 each, Rfl: 2 .  Galcanezumab-gnlm (EMGALITY) 120 MG/ML SOAJ, Inject into the skin., Disp: , Rfl:  .  levothyroxine (SYNTHROID) 75 MCG tablet, Take by mouth., Disp: , Rfl:  .  linaclotide (LINZESS) 290 MCG CAPS capsule, Take 1 capsule (290 mcg total) by mouth daily before breakfast., Disp: 90 capsule, Rfl: 1 .  mirabegron ER (MYRBETRIQ) 25 MG TB24 tablet, Take 1 tablet (25 mg total) by mouth daily., Disp: 30 tablet, Rfl: 5 .  Multiple Vitamin (MULTIVITAMIN WITH MINERALS) TABS tablet, Take 1 tablet by mouth daily., Disp: , Rfl:  .  naltrexone (DEPADE) 50 MG tablet, Take 1 tablet (50 mg total) by mouth daily., Disp: 30 tablet, Rfl: 0 .   ondansetron (ZOFRAN-ODT) 4 MG disintegrating tablet, Take 4 mg by mouth every 8 (eight) hours as needed., Disp: , Rfl:  .  rizatriptan (MAXALT) 10 MG tablet, TAKE 1 TABLET BY MOUTH AS NEEDED FOR MIGRAINE. MAY REPEAT IN 2 HOURS IF NEEDED, Disp: 10 tablet, Rfl: 0  Assessment/ Plan: 49 y.o. female   1. Oral thrush Nystatin sent.  Home care directions reviewed - nystatin (MYCOSTATIN) 100000 UNIT/ML suspension; Take 5 mLs (500,000 Units total) by mouth 4 (four) times daily.  Dispense: 200 mL; Refill: 0  2. Right ear pain Topical antibiotic plus dexamethasone sent.  I hesitate to use oral antibiotics or oral prednisone given the above.  Not totally convinced that this is an infection but will cover since she is not able to be directly visualized.  Reinforced need for oral antihistamine to help with allergy symptoms reduce pressures in ears.  She voiced understanding of follow-up as needed - ciprofloxacin-dexamethasone (CIPRODEX) OTIC suspension; Place 4 drops into the right ear 2 (two) times daily for 7 days.  Dispense: 7.5 mL; Refill: 0   Start time: 5:15pm End time: 5:25pm  Total time spent on patient care (including video visit/ documentation): 15 minutes  [place], [place] [phone]

## 2019-09-22 ENCOUNTER — Encounter (HOSPITAL_COMMUNITY): Payer: Medicaid Other

## 2019-09-23 ENCOUNTER — Ambulatory Visit: Payer: Commercial Managed Care - PPO | Attending: Internal Medicine

## 2019-09-23 DIAGNOSIS — Z23 Encounter for immunization: Secondary | ICD-10-CM

## 2019-09-23 NOTE — Progress Notes (Signed)
   Covid-19 Vaccination Clinic  Name:  Pam Chen    MRN: [id number] DOB: April 24, 1971  09/23/2019  [name] was observed post Covid-19 immunization for 15 minutes without incident. She was provided with Vaccine Information Sheet and instruction to access the V-Safe system.   [name] was instructed to call 911 with any severe reactions post vaccine: Marland Kitchen Difficulty breathing  . Swelling of face and throat  . A fast heartbeat  . A bad rash all over body  . Dizziness and weakness   Immunizations Administered    Name Date Dose VIS Date Route   Moderna COVID-19 Vaccine 09/23/2019  8:28 AM 0.5 mL 04/2019 Intramuscular   Manufacturer: Moderna   Lot: [id number]   NDC: 97182-099-06

## 2019-09-24 ENCOUNTER — Encounter (HOSPITAL_COMMUNITY): Payer: Medicaid Other

## 2019-09-25 ENCOUNTER — Encounter (HOSPITAL_COMMUNITY): Payer: Medicaid Other

## 2019-09-26 ENCOUNTER — Other Ambulatory Visit: Payer: Self-pay | Admitting: Family Medicine

## 2019-10-18 ENCOUNTER — Other Ambulatory Visit: Payer: Self-pay | Admitting: Family Medicine

## 2019-10-18 DIAGNOSIS — G43009 Migraine without aura, not intractable, without status migrainosus: Secondary | ICD-10-CM

## 2019-10-22 ENCOUNTER — Ambulatory Visit (INDEPENDENT_AMBULATORY_CARE_PROVIDER_SITE_OTHER): Payer: Commercial Managed Care - PPO | Admitting: Family Medicine

## 2019-10-22 DIAGNOSIS — R42 Dizziness and giddiness: Secondary | ICD-10-CM

## 2019-10-22 DIAGNOSIS — F17203 Nicotine dependence unspecified, with withdrawal: Secondary | ICD-10-CM

## 2019-10-22 MED ORDER — MECLIZINE HCL 25 MG PO TABS: 25.0000 mg | ORAL_TABLET | Freq: Three times a day (TID) | ORAL | 0 refills | Status: DC | PRN

## 2019-10-22 NOTE — Patient Instructions (Signed)
Coping with Quitting Smoking  Quitting smoking is a physical and mental challenge. You will face cravings, withdrawal symptoms, and temptation. Before quitting, work with your health care provider to make a plan that can help you cope. Preparation can help you quit and keep you from giving in. How can I cope with cravings? Cravings usually last for 5-10 minutes. If you get through it, the craving will pass. Consider taking the following actions to help you cope with cravings:  Keep your mouth busy: ? Chew sugar-free gum. ? Suck on hard candies or a straw. ? Brush your teeth.  Keep your hands and body busy: ? Immediately change to a different activity when you feel a craving. ? Squeeze or play with a ball. ? Do an activity or a hobby, like making bead jewelry, practicing needlepoint, or working with wood. ? Mix up your normal routine. ? Take a short exercise break. Go for a quick walk or run up and down stairs. ? Spend time in public places where smoking is not allowed.  Focus on doing something kind or helpful for someone else.  Call a friend or family member to talk during a craving.  Join a support group.  Call a quit line, such as 1-800-QUIT-NOW.  Talk with your health care provider about medicines that might help you cope with cravings and make quitting easier for you. How can I deal with withdrawal symptoms? Your body may experience negative effects as it tries to get used to not having nicotine in the system. These effects are called withdrawal symptoms. They may include:  Feeling hungrier than normal.  Trouble concentrating.  Irritability.  Trouble sleeping.  Feeling depressed.  Restlessness and agitation.  Craving a cigarette. To manage withdrawal symptoms:  Avoid places, people, and activities that trigger your cravings.  Remember why you want to quit.  Get plenty of sleep.  Avoid coffee and other caffeinated drinks. These may worsen some of your  symptoms. How can I handle social situations? Social situations can be difficult when you are quitting smoking, especially in the first few weeks. To manage this, you can:  Avoid parties, bars, and other social situations where people might be smoking.  Avoid alcohol.  Leave right away if you have the urge to smoke.  Explain to your family and friends that you are quitting smoking. Ask for understanding and support.  Plan activities with friends or family where smoking is not an option. What are some ways I can cope with stress? Wanting to smoke may cause stress, and stress can make you want to smoke. Find ways to manage your stress. Relaxation techniques can help. For example:  Breathe slowly and deeply, in through your nose and out through your mouth.  Listen to soothing, relaxing music.  Talk with a family member or friend about your stress.  Light a candle.  Soak in a bath or take a shower.  Think about a peaceful place. What are some ways I can prevent weight gain? Be aware that many people gain weight after they quit smoking. However, not everyone does. To keep from gaining weight, have a plan in place before you quit and stick to the plan after you quit. Your plan should include:  Having healthy snacks. When you have a craving, it may help to: ? Eat plain popcorn, crunchy carrots, celery, or other cut vegetables. ? Chew sugar-free gum.  Changing how you eat: ? Eat small portion sizes at meals. ? Eat 4-6 small meals   throughout the day instead of 1-2 large meals a day. ? Be mindful when you eat. Do not watch television or do other things that might distract you as you eat.  Exercising regularly: ? Make time to exercise each day. If you do not have time for a long workout, do short bouts of exercise for 5-10 minutes several times a day. ? Do some form of strengthening exercise, like weight lifting, and some form of aerobic exercise, like running or swimming.  Drinking  plenty of water or other low-calorie or no-calorie drinks. Drink 6-8 glasses of water daily, or as much as instructed by your health care provider. Summary  Quitting smoking is a physical and mental challenge. You will face cravings, withdrawal symptoms, and temptation to smoke again. Preparation can help you as you go through these challenges.  You can cope with cravings by keeping your mouth busy (such as by chewing gum), keeping your body and hands busy, and making calls to family, friends, or a helpline for people who want to quit smoking.  You can cope with withdrawal symptoms by avoiding places where people smoke, avoiding drinks with caffeine, and getting plenty of rest.  Ask your health care provider about the different ways to prevent weight gain, avoid stress, and handle social situations. This information is not intended to replace advice given to you by your health care provider. Make sure you discuss any questions you have with your health care provider. Document Revised: 04/27/2017 Document Reviewed: 05/12/2016 Elsevier Patient Education  2020 [employer]  

## 2019-10-22 NOTE — Progress Notes (Signed)
Telephone visit  Subjective: CC: Dizziness PCP: [name], Fransisca Kaufmann, MD JYN:WGNFA Pam Chen is a 49 y.o. female calls for telephone consult today. Patient provides verbal consent for consult held via phone.  Due to COVID-19 pandemic this visit was conducted virtually. This visit type was conducted due to national recommendations for restrictions regarding the COVID-19 Pandemic (e.g. social distancing, sheltering in place) in an effort to limit this patient's exposure and mitigate transmission in our community. All issues noted in this document were discussed and addressed.  A physical exam was not performed with this format.   Location of patient: Home Location of provider: [facility] Others present for call: None  1.  Dizziness Patient reports 3 days of dizzy spells, nausea, and feeling weak.  She quit smoking cold [place] about 1 week ago.  Symptoms onset about 3 days after discontinuation of smoking.  She has been using her Zofran and this does help some with the nausea but she continues to have dizziness.  She was sent home from work today because of her symptoms.  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  She also missed work yesterday due to symptoms.  ROS: Per HPI  Allergies  Allergen Reactions  . Aspirin Other (See Comments)    Upset tummy.  Can take ibuprofen.  . Oxycodone-Acetaminophen Anxiety    Can take vicodin  . Tramadol Hcl Nausea Only   Past Medical History:  Diagnosis Date  . Arthritis   . Current every day smoker 03/07/2017  . Diverticulitis   . GERD (gastroesophageal reflux disease)   . 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 History of adenomatous polyp of colon 05/02/2016   Adenoma 05/02/16; repeat 04/2021; [name], MD [facility])  . History of bipolar disorder 08/25/2019  . History of partial colectomy 03/28/2016   diverticulitis  . Hypertension   . Hypothyroid 02/02/2017  . Kidney stones   .  Migraine   . Obesity (BMI 30.0-34.9) 02/02/2017  . Polysubstance dependence including opioid type drug with complication, episodic abuse ([place]) 08/25/2019  . Rotator cuff injury   . Sleep apnea    CPAP  . Tennis elbow   . Thyroid disease     Current Outpatient Medications:  .  albuterol (VENTOLIN HFA) 108 (90 Base) MCG/ACT inhaler, TAKE 1-2 PUFFS AS NEEDED FOR WHEEZING/CHEST TIGHTNESS, Disp: 18 g, Rfl: 1 .  ARIPiprazole (ABILIFY) 20 MG tablet, Take by mouth., Disp: , Rfl:  .  baclofen (LIORESAL) 10 MG tablet, Take 1 tablet (10 mg total) by mouth 3 (three) times daily., Disp: 90 tablet, Rfl: 1 .  busPIRone (BUSPAR) 10 MG tablet, Take 1 tablet (10 mg total) by mouth 2 (two) times daily., Disp: 180 tablet, Rfl: 0 .  Cholecalciferol (HM VITAMIN D3) 4000 units CAPS, Take 4,000 Units by mouth daily., Disp: , Rfl:  .  FLUoxetine HCl 60 MG TABS, Take 60 mg by mouth daily., Disp: , Rfl:  .  Fluticasone-Salmeterol (ADVAIR) 250-50 MCG/DOSE AEPB, Inhale 1 puff into the lungs every 12 (twelve) hours., Disp: 60 each, Rfl: 2 .  Galcanezumab-gnlm (EMGALITY) 120 MG/ML SOAJ, Inject into the skin., Disp: , Rfl:  .  levothyroxine (SYNTHROID) 75 MCG tablet, Take by mouth., Disp: , Rfl:  .  linaclotide (LINZESS) 290 MCG CAPS capsule, Take 1 capsule (290 mcg total) by mouth daily before breakfast., Disp: 90 capsule, Rfl: 1 .  mirabegron ER (MYRBETRIQ) 25 MG TB24 tablet,  Take 1 tablet (25 mg total) by mouth daily., Disp: 30 tablet, Rfl: 5 .  Multiple Vitamin (MULTIVITAMIN WITH MINERALS) TABS tablet, Take 1 tablet by mouth daily., Disp: , Rfl:  .  nystatin (MYCOSTATIN) 100000 UNIT/ML suspension, Take 5 mLs (500,000 Units total) by mouth 4 (four) times daily., Disp: 200 mL, Rfl: 0 .  ondansetron (ZOFRAN-ODT) 4 MG disintegrating tablet, Take 4 mg by mouth every 8 (eight) hours as needed., Disp: , Rfl:  .  rizatriptan (MAXALT) 10 MG tablet, TAKE 1 TABLET BY MOUTH AS NEEDED FOR MIGRAINE. MAY REPEAT IN 2 HOURS IF NEEDED,  Disp: 10 tablet, Rfl: 1 .  TOVIAZ 4 MG TB24 tablet, TAKE 1 TABLET BY MOUTH EVERY DAY, Disp: 30 tablet, Rfl: 0  Assessment/ Plan: 49 y.o. female   1. Nicotine withdrawal High suspicion that her symptoms are related to nicotine withdrawal.  Though I did encourage her to go ahead and get her blood pressure checked since she does not have a monitor at home.  I am going to put her on Antivert for dizziness.  Caution sedation.  Okay to continue using Zofran if needed.  Work note provided.  We discussed red flags and she will follow-up as needed.  2. Dizziness - meclizine (ANTIVERT) 25 MG tablet; Take 1 tablet (25 mg total) by mouth 3 (three) times daily as needed for dizziness.  Dispense: 30 tablet; Refill: 0   Start time: 10:05am End time: 10:12am  Total time spent on patient care (including telephone call/ virtual visit): 12 minutes  Nakul Avino Hulen Skains, DO [facility] [phone]

## 2019-10-23 ENCOUNTER — Telehealth: Payer: Self-pay | Admitting: Family Medicine

## 2019-10-23 NOTE — Telephone Encounter (Signed)
Patient has visit with Dr. Reece Agar yesterday.  Her note was for her to return to work today.  Patient states she was not able to go to work today and would like for her work note to be extended for today. Per note patient does not want her work note to be uploaded to [employer] but I believe it links it to her mychart when we do a letter.   Please advise

## 2019-10-23 NOTE — Telephone Encounter (Signed)
Ok to extend note.  Yes, she just cannot access her mychart so she wants it to be printed for pick up.

## 2019-10-23 NOTE — Telephone Encounter (Signed)
Note done- patient picked up

## 2019-10-30 ENCOUNTER — Encounter: Payer: Self-pay | Admitting: Family Medicine

## 2019-10-30 ENCOUNTER — Ambulatory Visit (INDEPENDENT_AMBULATORY_CARE_PROVIDER_SITE_OTHER): Payer: Commercial Managed Care - PPO | Admitting: Family Medicine

## 2019-10-30 DIAGNOSIS — M940 Chondrocostal junction syndrome [Tietze]: Secondary | ICD-10-CM | POA: Diagnosis not present

## 2019-10-30 MED ORDER — TIZANIDINE HCL 4 MG PO TABS: 4.0000 mg | ORAL_TABLET | Freq: Three times a day (TID) | ORAL | 0 refills | Status: DC | PRN

## 2019-10-30 MED ORDER — IBUPROFEN 800 MG PO TABS: 800.0000 mg | ORAL_TABLET | Freq: Three times a day (TID) | ORAL | 0 refills | Status: DC | PRN

## 2019-10-30 NOTE — Patient Instructions (Signed)
Costochondritis Costochondritis is swelling and irritation (inflammation) of the tissue (cartilage) that connects your ribs to your breastbone (sternum). This causes pain in the front of your chest. Usually, the pain:  Starts gradually.  Is in more than one rib. This condition usually goes away on its own over time. Follow these instructions at home:  Do not do anything that makes your pain worse.  If directed, put ice on the painful area: ? Put ice in a plastic bag. ? Place a towel between your skin and the bag. ? Leave the ice on for 20 minutes, 2-3 times a day.  If directed, put heat on the affected area as often as told by your doctor. Use the heat source that your doctor tells you to use, such as a moist heat pack or a heating pad. ? Place a towel between your skin and the heat source. ? Leave the heat on for 20-30 minutes. ? Take off the heat if your skin turns bright red. This is very important if you cannot feel pain, heat, or cold. You may have a greater risk of getting burned.  Take over-the-counter and prescription medicines only as told by your doctor.  Return to your normal activities as told by your doctor. Ask your doctor what activities are safe for you.  Keep all follow-up visits as told by your doctor. This is important. Contact a doctor if:  You have chills or a fever.  Your pain does not go away or it gets worse.  You have a cough that does not go away. Get help right away if:  You are short of breath. This information is not intended to replace advice given to you by your health care provider. Make sure you discuss any questions you have with your health care provider. Document Revised: 05/30/2017 Document Reviewed: 09/08/2015 Elsevier Patient Education  2020 Elsevier Inc.  

## 2019-10-30 NOTE — Progress Notes (Signed)
Virtual Visit via Telephone Note  I connected with Pam Chen on 10/30/19 at 10:08 AM by telephone and verified that I am speaking with the correct person using two identifiers. Pam Chen is currently located at work and nobody is currently with her during this visit. The provider, [name], FNP is located in their home at time of visit.  I discussed the limitations, risks, security and privacy concerns of performing an evaluation and management service by telephone and the availability of in person appointments. I also discussed with the patient that there may be a patient responsible charge related to this service. The patient expressed understanding and agreed to proceed.  Subjective: PCP: [name], Pam Radon, MD  Chief Complaint  Patient presents with  . Pain   Patient reports she was treated for bronchitis a few weeks ago at which time she was also having some rib pain due to the coughing.  She feels the pain was starting to ease off but now it is hurting more than it was before.  Her ribs hurt to touch, take a deep breath, cough, sneeze, and talk.  She describes the pain as sharp.  She has been alternating heating pad and ice pack but is having trouble now because it hurts just to put it on her ribs.  She does report the pain is between her ribs and not on the actual bone.  She has been taking ibuprofen or Aleve which helps a little bit.  She prefers the ibuprofen and generally takes 400 to 600 mg every 4-6 hours.  Patient just completed a 10-day course of prednisone on 10/22/2019.   ROS: Per HPI  Current Outpatient Medications:  .  albuterol (VENTOLIN HFA) 108 (90 Base) MCG/ACT inhaler, TAKE 1-2 PUFFS AS NEEDED FOR WHEEZING/CHEST TIGHTNESS, Disp: 18 g, Rfl: 1 .  ARIPiprazole (ABILIFY) 20 MG tablet, Take by mouth., Disp: , Rfl:  .  baclofen (LIORESAL) 10 MG tablet, Take 1 tablet (10 mg total) by mouth 3 (three) times daily., Disp: 90 tablet, Rfl: 1 .  busPIRone (BUSPAR) 10  MG tablet, Take 1 tablet (10 mg total) by mouth 2 (two) times daily., Disp: 180 tablet, Rfl: 0 .  Cholecalciferol (HM VITAMIN D3) 4000 units CAPS, Take 4,000 Units by mouth daily., Disp: , Rfl:  .  FLUoxetine HCl 60 MG TABS, Take 60 mg by mouth daily., Disp: , Rfl:  .  Fluticasone-Salmeterol (ADVAIR) 250-50 MCG/DOSE AEPB, Inhale 1 puff into the lungs every 12 (twelve) hours., Disp: 60 each, Rfl: 2 .  Galcanezumab-gnlm (EMGALITY) 120 MG/ML SOAJ, Inject into the skin., Disp: , Rfl:  .  levothyroxine (SYNTHROID) 75 MCG tablet, Take by mouth., Disp: , Rfl:  .  linaclotide (LINZESS) 290 MCG CAPS capsule, Take 1 capsule (290 mcg total) by mouth daily before breakfast., Disp: 90 capsule, Rfl: 1 .  meclizine (ANTIVERT) 25 MG tablet, Take 1 tablet (25 mg total) by mouth 3 (three) times daily as needed for dizziness., Disp: 30 tablet, Rfl: 0 .  mirabegron ER (MYRBETRIQ) 25 MG TB24 tablet, Take 1 tablet (25 mg total) by mouth daily., Disp: 30 tablet, Rfl: 5 .  Multiple Vitamin (MULTIVITAMIN WITH MINERALS) TABS tablet, Take 1 tablet by mouth daily., Disp: , Rfl:  .  nystatin (MYCOSTATIN) 100000 UNIT/ML suspension, Take 5 mLs (500,000 Units total) by mouth 4 (four) times daily., Disp: 200 mL, Rfl: 0 .  ondansetron (ZOFRAN-ODT) 4 MG disintegrating tablet, Take 4 mg by mouth every 8 (eight) hours as needed.,  Disp: , Rfl:  .  rizatriptan (MAXALT) 10 MG tablet, TAKE 1 TABLET BY MOUTH AS NEEDED FOR MIGRAINE. MAY REPEAT IN 2 HOURS IF NEEDED, Disp: 10 tablet, Rfl: 1 .  TOVIAZ 4 MG TB24 tablet, TAKE 1 TABLET BY MOUTH EVERY DAY, Disp: 30 tablet, Rfl: 0  Allergies  Allergen Reactions  . Aspirin Other (See Comments)    Upset tummy.  Can take ibuprofen.  . Oxycodone-Acetaminophen Anxiety    Can take vicodin  . Tramadol Hcl Nausea Only   Past Medical History:  Diagnosis Date  . Arthritis   . Current every day smoker 03/07/2017  . Diverticulitis   . GERD (gastroesophageal reflux disease)   . Herpes simplex type 1  infection 02/13/2019  . 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 History of adenomatous polyp of colon 05/02/2016   Adenoma 05/02/16; repeat 04/2021; [name], MD [facility])  . History of bipolar disorder 08/25/2019  . History of partial colectomy 03/28/2016   diverticulitis  . Hypertension   . Hypothyroid 02/02/2017  . Kidney stones   . Migraine   . Obesity (BMI 30.0-34.9) 02/02/2017  . Polysubstance dependence including opioid type drug with complication, episodic abuse ([place]) 08/25/2019  . Rotator cuff injury   . Sleep apnea    CPAP  . Tennis elbow   . Thyroid disease     Observations/Objective: A&O  No respiratory distress or wheezing audible over the phone Mood, judgement, and thought processes all WNL   Assessment and Plan: 1. Costochondritis - Did not prescribe steroids since patient just got done with a 10-day course of prednisone.  She is going to continue ice and heat.  Education provided on costochondritis via MyChart. - ibuprofen (ADVIL) 800 MG tablet; Take 1 tablet (800 mg total) by mouth every 8 (eight) hours as needed.  Dispense: 30 tablet; Refill: 0 - tiZANidine (ZANAFLEX) 4 MG tablet; Take 1 tablet (4 mg total) by mouth every 8 (eight) hours as needed for muscle spasms.  Dispense: 30 tablet; Refill: 0   Follow Up Instructions:  I discussed the assessment and treatment plan with the patient. The patient was provided an opportunity to ask questions and all were answered. The patient agreed with the plan and demonstrated an understanding of the instructions.   The patient was advised to call back or seek an in-person evaluation if the symptoms worsen or if the condition fails to improve as anticipated.  The above assessment and management plan was discussed with the patient. The patient verbalized understanding of and has agreed to the management plan. Patient is aware to call the clinic if symptoms persist or worsen. Patient is aware when to return to the clinic for a  follow-up visit. Patient educated on when it is appropriate to go to the emergency department.   Time call ended: 10:17 AM  I provided 11 minutes of non-face-to-face time during this encounter.  Hendricks Limes, MSN, APRN, FNP-C [facility] 10/30/19

## 2019-11-07 ENCOUNTER — Telehealth: Payer: Self-pay | Admitting: Family Medicine

## 2019-11-07 DIAGNOSIS — K5900 Constipation, unspecified: Secondary | ICD-10-CM

## 2019-11-07 MED ORDER — LINACLOTIDE 290 MCG PO CAPS: 290.0000 ug | ORAL_CAPSULE | Freq: Every day | ORAL | 1 refills | Status: DC

## 2019-11-07 NOTE — Telephone Encounter (Signed)
Patient aware.

## 2019-11-07 NOTE — Telephone Encounter (Signed)
Sent refill for Linzess

## 2019-11-07 NOTE — Telephone Encounter (Signed)
  Prescription Request  11/07/2019  What is the name of the medication or equipment? linaclotide (LINZESS) 290 MCG CAPS capsule  Have you contacted your pharmacy to request a refill? (if applicable) no  Which pharmacy would you [facility]? [facility]   Patient notified that their request is being sent to the clinical staff for review and that they should receive a response within 2 business days.

## 2019-11-07 NOTE — Telephone Encounter (Signed)
Please advise Last RF 05/14/18 Last chronic check 08/13/19 no Dx

## 2019-11-12 ENCOUNTER — Other Ambulatory Visit: Payer: Self-pay | Admitting: *Deleted

## 2019-11-12 ENCOUNTER — Telehealth: Payer: Self-pay | Admitting: Family Medicine

## 2019-11-12 DIAGNOSIS — M940 Chondrocostal junction syndrome [Tietze]: Secondary | ICD-10-CM

## 2019-11-12 MED ORDER — IBUPROFEN 800 MG PO TABS: 800.0000 mg | ORAL_TABLET | Freq: Three times a day (TID) | ORAL | 1 refills | Status: DC | PRN

## 2019-11-12 MED ORDER — TIZANIDINE HCL 4 MG PO TABS: 4.0000 mg | ORAL_TABLET | Freq: Three times a day (TID) | ORAL | 1 refills | Status: DC | PRN

## 2019-11-12 MED ORDER — ONDANSETRON 4 MG PO TBDP: 4.0000 mg | ORAL_TABLET | Freq: Three times a day (TID) | ORAL | 11 refills | Status: DC | PRN

## 2019-11-12 NOTE — Telephone Encounter (Signed)
Patient can take both up to 2 or 3 times a day with food, if she is doing that and if still not improving then maybe we need to have her come in again and be seen, I would give it another week or 2 of that regiment. Arville Care, MD [facility] 11/12/2019, 10:46 AM

## 2019-11-12 NOTE — Telephone Encounter (Signed)
For review and respond .

## 2019-11-12 NOTE — Telephone Encounter (Signed)
Called and notified the patient.  She has been taking the tizanidine once a day and the Ibuprofen 800 mg twice a day.  Patient will try 2-3 times a day prn on both medications and will follow up if this does not help.  Patient states that she needs a refill on ibuprofen 800 mg last written 11/02/2019 for # 30   and Zofran 4 mg which was last written by our office 10/30/2018 # 20 with 11 refills - please review.

## 2019-11-12 NOTE — Telephone Encounter (Signed)
Patient's mother aware, script is ready.

## 2019-11-12 NOTE — Telephone Encounter (Signed)
Yes go ahead and refill the ibuprofen 800 for her and you can give her 60 with a refill.  You can do the same for the Zanaflex if she needs it.  Go ahead and refill the Zofran for the same #20 with 11 refills.

## 2019-11-25 ENCOUNTER — Other Ambulatory Visit: Payer: Self-pay

## 2019-11-25 ENCOUNTER — Encounter: Payer: Self-pay | Admitting: Family

## 2019-11-25 ENCOUNTER — Ambulatory Visit (INDEPENDENT_AMBULATORY_CARE_PROVIDER_SITE_OTHER): Payer: Commercial Managed Care - PPO | Admitting: Family

## 2019-11-25 VITALS — BP 105/72 | HR 68 | Temp 97.8°F | Ht 67.5 in | Wt 224.0 lb

## 2019-11-25 DIAGNOSIS — G43009 Migraine without aura, not intractable, without status migrainosus: Secondary | ICD-10-CM

## 2019-11-25 MED ORDER — KETOROLAC TROMETHAMINE 60 MG/2ML IM SOLN
60.0000 mg | Freq: Once | INTRAMUSCULAR | Status: AC
  Administered 2019-11-25: 60 mg via INTRAMUSCULAR

## 2019-11-25 NOTE — Patient Instructions (Signed)

## 2019-11-25 NOTE — Progress Notes (Signed)
Subjective:    Patient ID: Pam Chen, female    DOB: Jun 27, 1970, 49 y.o.   MRN: [id number]  Chief Complaint  Patient presents with  . Migraine    was given toradol yesterday    Pt presents to the office today with complaints of a migraine that started yesterday around 3 pm. She went to the Urgent Care yesterday and was given Toradol that helped her and she was able to sleep, but at 3 Am her headache returned. She has Maxalt that has not given her any relief. She does have a neurologists, but has not seen them in over a year related to COVID.   Migraine  This is a chronic problem. Episode onset: weekly. The problem has been unchanged. The pain is located in the temporal (worse on left) region. The pain quality is similar to prior headaches. The quality of the pain is described as pulsating and shooting. The pain is at a severity of 8/10. The pain is moderate. Associated symptoms include blurred vision, nausea, phonophobia and photophobia. Pertinent negatives include no back pain, dizziness, drainage, ear pain, eye pain, eye redness or vomiting. She has tried ketorolac injections, NSAIDs and darkened room for the symptoms. The treatment provided mild relief.     Review of Systems  HENT: Negative for ear pain.   Eyes: Positive for blurred vision and photophobia. Negative for pain and redness.  Gastrointestinal: Positive for nausea. Negative for vomiting.  Musculoskeletal: Negative for back pain.  Neurological: Negative for dizziness.  All other systems reviewed and are negative.      Objective:   Physical Exam Vitals reviewed.  Constitutional:      General: She is not in acute distress.    Appearance: She is well-developed.  HENT:     Head: Normocephalic and atraumatic.     Comments: Sensitivity to lights and noise  Eyes:     Pupils: Pupils are equal, round, and reactive to light.  Neck:     Thyroid: No thyromegaly.  Cardiovascular:     Rate and Rhythm: Normal rate and  regular rhythm.     Heart sounds: Normal heart sounds. No murmur heard.   Pulmonary:     Effort: Pulmonary effort is normal. No respiratory distress.     Breath sounds: Normal breath sounds. No wheezing.  Abdominal:     General: Bowel sounds are normal. There is no distension.     Palpations: Abdomen is soft.     Tenderness: There is no abdominal tenderness.  Musculoskeletal:        General: No tenderness. Normal range of motion.     Cervical back: Normal range of motion and neck supple.  Skin:    General: Skin is warm and dry.  Neurological:     Mental Status: She is alert and oriented to person, place, and time.     Cranial Nerves: No cranial nerve deficit.     Deep Tendon Reflexes: Reflexes are normal and symmetric.  Psychiatric:        Behavior: Behavior normal.        Thought Content: Thought content normal.        Judgment: Judgment normal.      BP 105/72   Pulse 68   Temp 97.8 F (36.6 C) (Temporal)   Ht 5' 7.5" (1.715 m)   Wt 224 lb (101.6 kg)   SpO2 97%   BMI 34.57 kg/m       Assessment & Plan:  Nithya Meriweather comes in  today with chief complaint of Migraine (was given toradol yesterday )   Diagnosis and orders addressed:  1. Migraine without aura and without status migrainosus, not intractable Pt will call Neurologists and make follow up appt Continue Maxalt as needed Avoid Caffeine  Limit stress Encouraged 8 hours of sleep  Keep follow up with PCP - ketorolac (TORADOL) injection 60 mg   Jannifer Rodney, FNP

## 2019-12-21 ENCOUNTER — Other Ambulatory Visit: Payer: Self-pay | Admitting: Family Medicine

## 2019-12-21 DIAGNOSIS — G43009 Migraine without aura, not intractable, without status migrainosus: Secondary | ICD-10-CM

## 2019-12-28 ENCOUNTER — Other Ambulatory Visit: Payer: Self-pay | Admitting: Family Medicine

## 2019-12-28 DIAGNOSIS — M940 Chondrocostal junction syndrome [Tietze]: Secondary | ICD-10-CM

## 2020-01-09 ENCOUNTER — Encounter: Payer: Self-pay | Admitting: Family Medicine

## 2020-01-09 ENCOUNTER — Ambulatory Visit (INDEPENDENT_AMBULATORY_CARE_PROVIDER_SITE_OTHER): Payer: Commercial Managed Care - PPO | Admitting: Family Medicine

## 2020-01-09 DIAGNOSIS — M25511 Pain in right shoulder: Secondary | ICD-10-CM | POA: Diagnosis not present

## 2020-01-09 MED ORDER — GUAIFENESIN ER 600 MG PO TB12: 600.0000 mg | ORAL_TABLET | Freq: Two times a day (BID) | ORAL | 2 refills | Status: DC

## 2020-01-09 NOTE — Progress Notes (Signed)
Virtual Visit via telephone Note  I connected with Pam Chen on 01/09/20 at 1122 by telephone and verified that I am speaking with the correct person using two identifiers. Pam Chen is currently located at work and no other people are currently with her during visit. The provider, [name] Liley Rake, MD is located in their office at time of visit.  Call ended at 1135  I discussed the limitations, risks, security and privacy concerns of performing an evaluation and management service by telephone and the availability of in person appointments. I also discussed with the patient that there may be a patient responsible charge related to this service. The patient expressed understanding and agreed to proceed.   History and Present Illness: Patient is calling in for shoulder pain in her shoulder and has trouble lifting hand overhead. Her right shoulder is causing problems more over the past few years.  The right one is worse than the other shoulder. She has pain going to back of neck as well. She denies any numbness or weakness.   No diagnosis found.  Outpatient Encounter Medications as of 01/09/2020  Medication Sig  . albuterol (VENTOLIN HFA) 108 (90 Base) MCG/ACT inhaler TAKE 1-2 PUFFS AS NEEDED FOR WHEEZING/CHEST TIGHTNESS  . ARIPiprazole (ABILIFY) 20 MG tablet Take by mouth.  . baclofen (LIORESAL) 10 MG tablet Take 1 tablet (10 mg total) by mouth 3 (three) times daily.  . busPIRone (BUSPAR) 10 MG tablet Take 1 tablet (10 mg total) by mouth 2 (two) times daily.  . Cholecalciferol (HM VITAMIN D3) 4000 units CAPS Take 4,000 Units by mouth daily.  Marland Kitchen FLUoxetine HCl 60 MG TABS Take 60 mg by mouth daily.  . Fluticasone-Salmeterol (ADVAIR) 250-50 MCG/DOSE AEPB Inhale 1 puff into the lungs every 12 (twelve) hours.  Marland Kitchen Galcanezumab-gnlm (EMGALITY) 120 MG/ML SOAJ Inject into the skin.  Marland Kitchen ibuprofen (ADVIL) 800 MG tablet TAKE 1 TABLET BY MOUTH EVERY 8 HOURS AS NEEDED  . levothyroxine (SYNTHROID)  75 MCG tablet Take by mouth.  . linaclotide (LINZESS) 290 MCG CAPS capsule Take 1 capsule (290 mcg total) by mouth daily before breakfast.  . meclizine (ANTIVERT) 25 MG tablet Take 1 tablet (25 mg total) by mouth 3 (three) times daily as needed for dizziness.  . mirabegron ER (MYRBETRIQ) 25 MG TB24 tablet Take 1 tablet (25 mg total) by mouth daily.  . Multiple Vitamin (MULTIVITAMIN WITH MINERALS) TABS tablet Take 1 tablet by mouth daily.  Marland Kitchen nystatin (MYCOSTATIN) 100000 UNIT/ML suspension Take 5 mLs (500,000 Units total) by mouth 4 (four) times daily.  . ondansetron (ZOFRAN-ODT) 4 MG disintegrating tablet Take 1 tablet (4 mg total) by mouth every 8 (eight) hours as needed.  . rizatriptan (MAXALT) 10 MG tablet TAKE 1 TABLET BY MOUTH AS NEEDED FOR MIGRAINE. MAY REPEAT IN 2 HOURS IF NEEDED  . tiZANidine (ZANAFLEX) 4 MG tablet TAKE 1 TABLET (4 MG TOTAL) BY MOUTH EVERY 8 (EIGHT) HOURS AS NEEDED FOR MUSCLE SPASMS.  Marland Kitchen TOVIAZ 4 MG TB24 tablet TAKE 1 TABLET BY MOUTH EVERY DAY   No facility-administered encounter medications on file as of 01/09/2020.    Review of Systems  Constitutional: Negative for chills and fever.  Eyes: Negative for visual disturbance.  Respiratory: Negative for chest tightness and shortness of breath.   Cardiovascular: Negative for chest pain and leg swelling.  Genitourinary: Negative for difficulty urinating and dysuria.  Musculoskeletal: Positive for arthralgias. Negative for back pain and gait problem.  Skin: Negative for rash.  Neurological: Negative  for light-headedness and headaches.  Psychiatric/Behavioral: Negative for agitation and behavioral problems.  All other systems reviewed and are negative.   Observations/Objective: Patient sounds comfortable and in no acute distress  Assessment and Plan: Problem List Items Addressed This Visit    None    Visit Diagnoses    Acute pain of right shoulder    -  Primary   Relevant Medications   guaiFENesin (MUCINEX) 600 MG  12 hr tablet   Other Relevant Orders   Ambulatory referral to Orthopedic Surgery       Follow up plan: Return if symptoms worsen or fail to improve.     I discussed the assessment and treatment plan with the patient. The patient was provided an opportunity to ask questions and all were answered. The patient agreed with the plan and demonstrated an understanding of the instructions.   The patient was advised to call back or seek an in-person evaluation if the symptoms worsen or if the condition fails to improve as anticipated.  The above assessment and management plan was discussed with the patient. The patient verbalized understanding of and has agreed to the management plan. Patient is aware to call the clinic if symptoms persist or worsen. Patient is aware when to return to the clinic for a follow-up visit. Patient educated on when it is appropriate to go to the emergency department.    I provided 13 minutes of non-face-to-face time during this encounter.    Nils Pyle, MD

## 2020-02-18 ENCOUNTER — Encounter: Payer: Self-pay | Admitting: Family Medicine

## 2020-02-18 ENCOUNTER — Ambulatory Visit: Payer: Commercial Managed Care - PPO | Admitting: Family Medicine

## 2020-02-21 ENCOUNTER — Other Ambulatory Visit: Payer: Self-pay | Admitting: Family Medicine

## 2020-02-21 DIAGNOSIS — M940 Chondrocostal junction syndrome [Tietze]: Secondary | ICD-10-CM

## 2020-03-17 ENCOUNTER — Other Ambulatory Visit: Payer: Self-pay | Admitting: Family Medicine

## 2020-03-17 DIAGNOSIS — G43009 Migraine without aura, not intractable, without status migrainosus: Secondary | ICD-10-CM

## 2020-04-01 ENCOUNTER — Encounter: Payer: Self-pay | Admitting: Nurse Practitioner

## 2020-04-01 ENCOUNTER — Ambulatory Visit (INDEPENDENT_AMBULATORY_CARE_PROVIDER_SITE_OTHER): Payer: Commercial Managed Care - PPO | Admitting: Nurse Practitioner

## 2020-04-01 DIAGNOSIS — J040 Acute laryngitis: Secondary | ICD-10-CM

## 2020-04-01 NOTE — Progress Notes (Signed)
   Virtual Visit via telephone Note Due to COVID-19 pandemic this visit was conducted virtually. This visit type was conducted due to national recommendations for restrictions regarding the COVID-19 Pandemic (e.g. social distancing, sheltering in place) in an effort to limit this patient's exposure and mitigate transmission in our community. All issues noted in this document were discussed and addressed.  A physical exam was not performed with this format.  I connected with Pam Chen on 04/01/20 at 9:10 by telephone and verified that I am speaking with the correct person using two identifiers. Pam Chen is currently located at work and no one is currently with her during visit. The provider, [name], FNP is located in their office at time of visit.  I discussed the limitations, risks, security and privacy concerns of performing an evaluation and management service by telephone and the availability of in person appointments. I also discussed with the patient that there may be a patient responsible charge related to this service. The patient expressed understanding and agreed to proceed.   History and Present Illness:   Chief Complaint: Sore Throat   HPI patient calls in for appointment c/o laryngitis for 7 weeks. Voice goes in and and. She has had several rounds of antibiotics, and steroids and her voice is almost completely gone today. Has intermittent soreness but not today. No swollen glands in neck that she can feel.   Review of Systems  Constitutional: Negative for chills and fever.  HENT: Negative for congestion, ear pain, hearing loss and sore throat.   Respiratory: Negative for cough.   Neurological: Positive for headaches.  All other systems reviewed and are negative.    Observations/Objective: Alert and oriented- answers all questions appropriately No distress Faint voice   Assessment and Plan: Pam Chen in today with chief complaint of Sore  Throat   1. Laryngitis, acute Only talk when have to Gargle with salt water - Ambulatory referral to ENT    Follow Up Instructions: prn    I discussed the assessment and treatment plan with the patient. The patient was provided an opportunity to ask questions and all were answered. The patient agreed with the plan and demonstrated an understanding of the instructions.   The patient was advised to call back or seek an in-person evaluation if the symptoms worsen or if the condition fails to improve as anticipated.  The above assessment and management plan was discussed with the patient. The patient verbalized understanding of and has agreed to the management plan. Patient is aware to call the clinic if symptoms persist or worsen. Patient is aware when to return to the clinic for a follow-up visit. Patient educated on when it is appropriate to go to the emergency department.   Time call ended:  9:25  I provided 15 minutes of non-face-to-face time during this encounter.    [name], FNP

## 2020-04-02 ENCOUNTER — Telehealth: Payer: Self-pay

## 2020-05-13 ENCOUNTER — Encounter: Payer: Self-pay | Admitting: Family Medicine

## 2020-05-13 ENCOUNTER — Ambulatory Visit (INDEPENDENT_AMBULATORY_CARE_PROVIDER_SITE_OTHER): Payer: Commercial Managed Care - PPO | Admitting: Family Medicine

## 2020-05-13 ENCOUNTER — Other Ambulatory Visit: Payer: Self-pay

## 2020-05-13 VITALS — BP 105/76 | HR 83 | Temp 98.0°F | Ht 67.5 in | Wt 218.0 lb

## 2020-05-13 DIAGNOSIS — Z23 Encounter for immunization: Secondary | ICD-10-CM | POA: Diagnosis not present

## 2020-05-13 DIAGNOSIS — E785 Hyperlipidemia, unspecified: Secondary | ICD-10-CM

## 2020-05-13 DIAGNOSIS — G43009 Migraine without aura, not intractable, without status migrainosus: Secondary | ICD-10-CM | POA: Diagnosis not present

## 2020-05-13 MED ORDER — RIZATRIPTAN BENZOATE 10 MG PO TABS: ORAL_TABLET | ORAL | 1 refills | Status: DC

## 2020-05-13 MED ORDER — ONDANSETRON 4 MG PO TBDP: 4.0000 mg | ORAL_TABLET | Freq: Three times a day (TID) | ORAL | 11 refills | Status: DC | PRN

## 2020-05-13 MED ORDER — EMGALITY 120 MG/ML ~~LOC~~ SOAJ: 120.0000 mg | SUBCUTANEOUS | 11 refills | Status: DC

## 2020-05-13 NOTE — Progress Notes (Signed)
BP 105/76   Pulse 83   Temp 98 F (36.7 C)   Ht 5' 7.5" (1.715 m)   Wt 218 lb (98.9 kg)   SpO2 97%   BMI 33.64 kg/m    Subjective:   Patient ID: Pam Chen, female    DOB: 1971/04/29, 49 y.o.   MRN: [id number]  HPI: Pam Chen is a 49 y.o. female presenting on 05/13/2020 for Medical Management of Chronic Issues, Migraine, and Anxiety   HPI Is coming in today for migraine recheck.  She is actually been off of her Emgality for 3 months and the doing okay but she would like to get back on it because she is having some breakthrough.  She does use the Maxalt for breakthrough and the Zofran for breakthrough she feels like those are helping her a lot.  She feels like her migraines are more stable than they ever have been.  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  Relevant past medical, surgical, family and social history reviewed and updated as indicated. Interim medical history since our last visit reviewed. Allergies and medications reviewed and updated.  Review of Systems  Constitutional: Negative for chills and fever.  HENT: Negative for congestion, ear discharge and ear pain.   Eyes: Negative for visual disturbance.  Respiratory: Negative for chest tightness and shortness of breath.   Cardiovascular: Negative for chest pain and leg swelling.  Genitourinary: Negative for difficulty urinating and dysuria.  Musculoskeletal: Negative for back pain and gait problem.  Skin: Negative for rash.  Neurological: Positive for headaches. Negative for dizziness and light-headedness.  Psychiatric/Behavioral: Negative for agitation and behavioral problems.  All other systems reviewed and are negative.   Per HPI unless specifically indicated above   Allergies as of 05/13/2020      Reactions   Aspirin Other (See Comments)   Upset tummy.  Can take ibuprofen.   Oxycodone-acetaminophen Anxiety   Can take vicodin   Tramadol Hcl Nausea Only      Medication List        Accurate as of May 13, 2020  3:22 PM. If you have any questions, ask your nurse or doctor.        STOP taking these medications   baclofen 10 MG tablet Commonly known as: LIORESAL Stopped by: Fransisca Kaufmann Jonisha Kindig, MD   guaiFENesin 600 MG 12 hr tablet Commonly known as: Mucinex Stopped by: Fransisca Kaufmann Agnes Probert, MD   nystatin 100000 UNIT/ML suspension Commonly known as: MYCOSTATIN Stopped by: Worthy Rancher, MD   Toviaz 4 MG Tb24 tablet Generic drug: fesoterodine Stopped by: Worthy Rancher, MD     TAKE these medications   albuterol 108 (90 Base) MCG/ACT inhaler Commonly known as: VENTOLIN HFA TAKE 1-2 PUFFS AS NEEDED FOR WHEEZING/CHEST TIGHTNESS   ARIPiprazole 20 MG tablet Commonly known as: ABILIFY Take 30 mg by mouth.   busPIRone 10 MG tablet Commonly known as: BUSPAR Take 1 tablet (10 mg total) by mouth 2 (two) times daily.   Cholecalciferol 100 MCG (4000 UT) Caps Take 4,000 Units by mouth daily.   Emgality 120 MG/ML Soaj Generic drug: Galcanezumab-gnlm Inject into the skin.   FLUoxetine HCl 60 MG Tabs Take 60 mg by mouth daily.   Fluticasone-Salmeterol 250-50 MCG/DOSE Aepb Commonly known as: ADVAIR Inhale 1 puff into the lungs every 12 (twelve) hours.   ibuprofen 800 MG tablet Commonly known as: ADVIL TAKE 1 TABLET BY MOUTH EVERY 8 HOURS AS NEEDED   levothyroxine 75  MCG tablet Commonly known as: SYNTHROID Take by mouth.   linaclotide 290 MCG Caps capsule Commonly known as: LINZESS Take 1 capsule (290 mcg total) by mouth daily before breakfast.   meclizine 25 MG tablet Commonly known as: ANTIVERT Take 1 tablet (25 mg total) by mouth 3 (three) times daily as needed for dizziness.   mirabegron ER 25 MG Tb24 tablet Commonly known as: Myrbetriq Take 1 tablet (25 mg total) by mouth daily.   multivitamin with minerals Tabs tablet Take 1 tablet by mouth daily.   ondansetron 4 MG disintegrating tablet Commonly known as: ZOFRAN-ODT Take 1  tablet (4 mg total) by mouth every 8 (eight) hours as needed.   rizatriptan 10 MG tablet Commonly known as: MAXALT TAKE 1 TABLET BY MOUTH AS NEEDED FOR MIGRAINE. MAY REPEAT IN 2 HOURS IF NEEDED   tiZANidine 4 MG tablet Commonly known as: ZANAFLEX TAKE 1 TABLET (4 MG TOTAL) BY MOUTH EVERY 8 (EIGHT) HOURS AS NEEDED FOR MUSCLE SPASMS.        Objective:   BP 105/76   Pulse 83   Temp 98 F (36.7 C)   Ht 5' 7.5" (1.715 m)   Wt 218 lb (98.9 kg)   SpO2 97%   BMI 33.64 kg/m   Wt Readings from Last 3 Encounters:  05/13/20 218 lb (98.9 kg)  11/25/19 224 lb (101.6 kg)  08/13/19 214 lb (97.1 kg)    Physical Exam Vitals and nursing note reviewed.  Constitutional:      General: She is not in acute distress.    Appearance: She is well-developed and well-nourished. She is not diaphoretic.  Eyes:     Extraocular Movements: EOM normal.     Conjunctiva/sclera: Conjunctivae normal.  Cardiovascular:     Rate and Rhythm: Normal rate and regular rhythm.     Pulses: Intact distal pulses.     Heart sounds: Normal heart sounds. No murmur heard.   Pulmonary:     Effort: Pulmonary effort is normal. No respiratory distress.     Breath sounds: Normal breath sounds. No wheezing.  Musculoskeletal:        General: No tenderness or edema. Normal range of motion.  Skin:    General: Skin is warm and dry.     Findings: No rash.  Neurological:     Mental Status: She is alert and oriented to person, place, and time.     Coordination: Coordination normal.  Psychiatric:        Mood and Affect: Mood and affect normal.        Behavior: Behavior normal.       Assessment & Plan:   Problem List Items Addressed This Visit      Cardiovascular and Mediastinum   Migraine - Primary   Relevant Medications   Galcanezumab-gnlm (EMGALITY) 120 MG/ML SOAJ   rizatriptan (MAXALT) 10 MG tablet   Other Relevant Orders   CBC with Differential/Platelet   CMP14+EGFR     Other   Hyperlipidemia LDL goal  <130   Relevant Orders   Lipid panel    Other Visit Diagnoses    Need for immunization against influenza       Relevant Orders   Flu Vaccine QUAD 36+ mos IM (Completed)      Restart Emgality, and Maxalt. Follow up plan: Return in about 6 months (around 11/11/2020), or if symptoms worsen or fail to improve, for migrain recheck.  Counseling provided for all of the vaccine components Orders Placed This Encounter  Procedures  .  Flu Vaccine QUAD 36+ mos IM    Caryl Pina, MD [facility] 05/13/2020, 3:22 PM

## 2020-05-14 LAB — CBC WITH DIFFERENTIAL/PLATELET
Basophils Absolute: 0 10*3/uL (ref 0.0–0.2)
Basos: 1 %
EOS (ABSOLUTE): 0.1 10*3/uL (ref 0.0–0.4)
Eos: 1 %
Hematocrit: 40.3 % (ref 34.0–46.6)
Hemoglobin: 13.8 g/dL (ref 11.1–15.9)
Immature Grans (Abs): 0 10*3/uL (ref 0.0–0.1)
Immature Granulocytes: 0 %
Lymphocytes Absolute: 2.1 10*3/uL (ref 0.7–3.1)
Lymphs: 30 %
MCH: 30.5 pg (ref 26.6–33.0)
MCHC: 34.2 g/dL (ref 31.5–35.7)
MCV: 89 fL (ref 79–97)
Monocytes Absolute: 0.6 10*3/uL (ref 0.1–0.9)
Monocytes: 8 %
Neutrophils Absolute: 4.2 10*3/uL (ref 1.4–7.0)
Neutrophils: 60 %
Platelets: 218 10*3/uL (ref 150–450)
RBC: 4.53 x10E6/uL (ref 3.77–5.28)
RDW: 12.5 % (ref 11.7–15.4)
WBC: 7 10*3/uL (ref 3.4–10.8)

## 2020-05-14 LAB — CMP14+EGFR
ALT: 14 IU/L (ref 0–32)
AST: 16 IU/L (ref 0–40)
Albumin/Globulin Ratio: 1.8 (ref 1.2–2.2)
Albumin: 4 g/dL (ref 3.8–4.8)
Alkaline Phosphatase: 69 IU/L (ref 44–121)
BUN/Creatinine Ratio: 13 (ref 9–23)
BUN: 11 mg/dL (ref 6–24)
Bilirubin Total: <0.2 mg/dL (ref 0.0–1.2)
CO2: 27 mmol/L (ref 20–29)
Calcium: 9.1 mg/dL (ref 8.7–10.2)
Chloride: 101 mmol/L (ref 96–106)
Creatinine, Ser: 0.84 mg/dL (ref 0.57–1.00)
GFR calc Af Amer: 96 mL/min/{1.73_m2} (ref >59)
GFR calc non Af Amer: 83 mL/min/{1.73_m2} (ref >59)
Globulin, Total: 2.2 g/dL (ref 1.5–4.5)
Glucose: 99 mg/dL (ref 65–99)
Potassium: 4.3 mmol/L (ref 3.5–5.2)
Sodium: 139 mmol/L (ref 134–144)
Total Protein: 6.2 g/dL (ref 6.0–8.5)

## 2020-05-14 LAB — LIPID PANEL
Chol/HDL Ratio: 4.5 ratio — ABNORMAL HIGH (ref 0.0–4.4)
Cholesterol, Total: 199 mg/dL (ref 100–199)
HDL: 44 mg/dL (ref >39)
LDL Chol Calc (NIH): 130 mg/dL — ABNORMAL HIGH (ref 0–99)
Triglycerides: 137 mg/dL (ref 0–149)
VLDL Cholesterol Cal: 25 mg/dL (ref 5–40)

## 2020-05-20 ENCOUNTER — Ambulatory Visit (INDEPENDENT_AMBULATORY_CARE_PROVIDER_SITE_OTHER): Payer: Commercial Managed Care - PPO | Admitting: Family Medicine

## 2020-05-20 ENCOUNTER — Encounter: Payer: Self-pay | Admitting: Family Medicine

## 2020-05-20 DIAGNOSIS — J40 Bronchitis, not specified as acute or chronic: Secondary | ICD-10-CM

## 2020-05-20 DIAGNOSIS — R059 Cough, unspecified: Secondary | ICD-10-CM | POA: Diagnosis not present

## 2020-05-20 MED ORDER — BENZONATATE 100 MG PO CAPS: 100.0000 mg | ORAL_CAPSULE | Freq: Three times a day (TID) | ORAL | 0 refills | Status: DC | PRN

## 2020-05-20 MED ORDER — METHYLPREDNISOLONE 4 MG PO TBPK: ORAL_TABLET | ORAL | 0 refills | Status: DC

## 2020-05-20 MED ORDER — AZITHROMYCIN 250 MG PO TABS: ORAL_TABLET | ORAL | 0 refills | Status: DC

## 2020-05-20 NOTE — Progress Notes (Signed)
   Virtual Visit via telephone Note Due to COVID-19 pandemic this visit was conducted virtually. This visit type was conducted due to national recommendations for restrictions regarding the COVID-19 Pandemic (e.g. social distancing, sheltering in place) in an effort to limit this patient's exposure and mitigate transmission in our community. All issues noted in this document were discussed and addressed.  A physical exam was not performed with this format.  I connected with Pam Chen on 05/20/20 at 0814 by telephone and verified that I am speaking with the correct person using two identifiers. Pam Chen is currently located at home and no one is currently with her during the visit. The provider, Gabriel Earing, FNP is located in their office at time of visit.  I discussed the limitations, risks, security and privacy concerns of performing an evaluation and management service by telephone and the availability of in person appointments. I also discussed with the patient that there may be a patient responsible charge related to this service. The patient expressed understanding and agreed to proceed.   History and Present Illness:  HPI Pam Chen report recurrent bronchitis for the last 2 years. She reports a worsening in her cough for last 3-4 days. She reports that her cough is constant and is now productive. She report clear to cloudy sputum. Also reports head and chest congestion. She has occasionally wheezing. She has been usually advair inhaler daily and albuterol inhaler as needed. She has tried OTC cough medication without relief. Denies fever, chills, sore throat, body aches, chest pain, or shortness of breath.   ROS As per HPI.   Observations/Objective: Alert and oriented x3. Able to speak in full sentences without difficulty. Coughing noted.   Assessment and Plan: Diagnoses and all orders for this visit:  Cough Bronchitis Rest, push fluids. Albuterol as needed. Return to office for  new or worsening symptoms, or if symptoms persist.  -     azithromycin (ZITHROMAX Z-PAK) 250 MG tablet; As directed -     methylPREDNISolone (MEDROL DOSEPAK) 4 MG TBPK tablet; Use as directed -     benzonatate (TESSALON PERLES) 100 MG capsule; Take 1 capsule (100 mg total) by mouth 3 (three) times daily as needed for cough.  Follow Up Instructions: As needed.     I discussed the assessment and treatment plan with the patient. The patient was provided an opportunity to ask questions and all were answered. The patient agreed with the plan and demonstrated an understanding of the instructions.   The patient was advised to call back or seek an in-person evaluation if the symptoms worsen or if the condition fails to improve as anticipated.  The above assessment and management plan was discussed with the patient. The patient verbalized understanding of and has agreed to the management plan. Patient is aware to call the clinic if symptoms persist or worsen. Patient is aware when to return to the clinic for a follow-up visit. Patient educated on when it is appropriate to go to the emergency department.   Time call ended:  0821  I provided 15 minutes of non-face-to-face time during this encounter.    Gabriel Earing, FNP

## 2020-05-25 ENCOUNTER — Telehealth: Payer: Self-pay | Admitting: Family Medicine

## 2020-05-25 NOTE — Telephone Encounter (Signed)
Pt states she is still taking the antibiotic and using the tessalon pearles but her cough is terrible and she was up all night coughing and feels like she may have pulled a muscle from coughing so much. She wants to know if there is something else she could get for the cough and would also like to know if you could send in ibuprofen 800mg  for the "pulled muscle".

## 2020-05-25 NOTE — Telephone Encounter (Signed)
Pt had an appt 12/23 and Tiffany called in antibiotic but pt states that it is not helping and would like something else called in to treat

## 2020-05-26 NOTE — Telephone Encounter (Signed)
Covering PCP- please advise  

## 2020-05-26 NOTE — Telephone Encounter (Signed)
lmtcb

## 2020-05-26 NOTE — Telephone Encounter (Signed)
The only other thing would be cough meds with codiene in it and cannot do that without being seen face to face.

## 2020-05-26 NOTE — Telephone Encounter (Signed)
Pt called to get update on status of her issue with needing something else called in to pharmacy for her for cough.  Dr [name] wont be in office until tomorrow. Pt wants to know if covering/other provider can call her something else in for cough.  Please advise and call patient.

## 2020-05-26 NOTE — Telephone Encounter (Signed)
Pt returned missed call from nurse and stated that she is willing to see anyone tomorrow for that appt so that someone can call her in something for her cough.

## 2020-05-27 ENCOUNTER — Telehealth: Payer: Commercial Managed Care - PPO | Admitting: Physician Assistant

## 2020-05-27 DIAGNOSIS — R059 Cough, unspecified: Secondary | ICD-10-CM

## 2020-05-27 NOTE — Telephone Encounter (Signed)
Patient aware no appts available. Advised patient she could do an evisit through [facility] and she will do that.

## 2020-05-27 NOTE — Progress Notes (Signed)
Hi Ritaj,   I am sorry you are not feeling well. I see you were treated with an antibiotic, an oral steroid and a medication for cough last week by your PCP and asked to follow-up if no improvement.  Please connect with [facility] and let him know you are not improving following treatment.    Based on what you shared with me, I feel your condition warrants further evaluation and I recommend that you be seen for a face to face visit.  Please contact your primary care physician practice to be seen. Many offices offer virtual options to be seen via video if you are not comfortable going in person to a medical facility at this time.  If you do not have a PCP, [facility] offers a free physician referral service available at [phone]. Our trained staff has the experience, knowledge and resources to put you in touch with a physician who is right for you.   You also have the option of a video visit through [email or web address]  If you are having a true medical emergency please call 911.  NOTE: If you entered your credit card information for this eVisit, you will not be charged. You may see a "hold" on your card for the $35 but that hold will drop off and you will not have a charge processed.  Your e-visit answers were reviewed by a board certified advanced clinical practitioner to complete your personal care plan.  Thank you for using e-Visits.

## 2020-05-27 NOTE — Telephone Encounter (Signed)
Yes please make the patient an appointment with anybody possible if possible

## 2020-06-25 ENCOUNTER — Telehealth: Payer: Self-pay | Admitting: *Deleted

## 2020-06-25 DIAGNOSIS — G43009 Migraine without aura, not intractable, without status migrainosus: Secondary | ICD-10-CM

## 2020-06-25 NOTE — Telephone Encounter (Signed)
Key: [id number]   Status Sent to Plan today Drug Emgality 120MG /ML auto-injectors (migraine)

## 2020-06-28 NOTE — Telephone Encounter (Signed)
Form came in and more clinical questions were answered.  Placed on Dettingers desk for signature. Once he signs, we will fax back to plan.

## 2020-06-29 NOTE — Telephone Encounter (Signed)
Approved today [id number];Review Type:Prior Auth;Coverage Start Date:05/26/2020;Coverage End Date:06/29/2021;  [facility] aware

## 2020-07-05 ENCOUNTER — Telehealth: Payer: Self-pay | Admitting: *Deleted

## 2020-07-05 NOTE — Telephone Encounter (Signed)
PA in process for emgality. Completed PA under [facility] Confirmation #:[id number]

## 2020-07-06 NOTE — Telephone Encounter (Signed)
Emgality APPROVED 07/05/2020 - 06/30/2021 Pharmacy and patient aware.

## 2020-07-18 ENCOUNTER — Other Ambulatory Visit: Payer: Self-pay | Admitting: Family Medicine

## 2020-07-18 DIAGNOSIS — G43009 Migraine without aura, not intractable, without status migrainosus: Secondary | ICD-10-CM

## 2020-07-23 ENCOUNTER — Encounter: Payer: Self-pay | Admitting: Family Medicine

## 2020-07-23 ENCOUNTER — Ambulatory Visit (INDEPENDENT_AMBULATORY_CARE_PROVIDER_SITE_OTHER): Payer: Commercial Managed Care - PPO | Admitting: Family Medicine

## 2020-07-23 ENCOUNTER — Other Ambulatory Visit: Payer: Self-pay

## 2020-07-23 VITALS — BP 105/68 | HR 80 | Ht 67.5 in | Wt 219.0 lb

## 2020-07-23 DIAGNOSIS — R6884 Jaw pain: Secondary | ICD-10-CM | POA: Diagnosis not present

## 2020-07-23 DIAGNOSIS — E039 Hypothyroidism, unspecified: Secondary | ICD-10-CM | POA: Diagnosis not present

## 2020-07-23 DIAGNOSIS — H9201 Otalgia, right ear: Secondary | ICD-10-CM | POA: Diagnosis not present

## 2020-07-23 DIAGNOSIS — G43001 Migraine without aura, not intractable, with status migrainosus: Secondary | ICD-10-CM | POA: Diagnosis not present

## 2020-07-23 DIAGNOSIS — G8929 Other chronic pain: Secondary | ICD-10-CM

## 2020-07-23 NOTE — Progress Notes (Signed)
BP 105/68   Pulse 80   Ht 5' 7.5" (1.715 m)   Wt 219 lb (99.3 kg)   SpO2 99%   BMI 33.79 kg/m    Subjective:   Patient ID: Pam Chen, female    DOB: September 25, 1970, 50 y.o.   MRN: [id number]  HPI: Pam Chen is a 50 y.o. female presenting on 07/23/2020 for No chief complaint on file.   HPI Hypothyroidism recheck Patient is coming in for thyroid recheck today as well. They deny any issues with hair changes or heat or cold problems or diarrhea or constipation. They deny any chest pain or palpitations. They are currently on levothyroxine   Patient is having persistent migraines that are not being controlled with Emgality, would like to see neurology.  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  Patient has been having a lot of skin she had a right-sided neck pain going down into her neck on left side.  She feels like she has muscle soreness and pain and then pain in her ear as well and this is been chronic, she did see dentist and did not feel like it helped and would like to go see an ENT  Relevant past medical, surgical, family and social history reviewed and updated as indicated. Interim medical history since our last visit reviewed. Allergies and medications reviewed and updated.  Review of Systems  Constitutional: Negative for chills and fever.  HENT: Positive for ear pain. Negative for dental problem and hearing loss.   Eyes: Negative for visual disturbance.  Respiratory: Negative for chest tightness and shortness of breath.   Cardiovascular: Negative for chest pain and leg swelling.  Genitourinary: Negative for difficulty urinating and dysuria.  Musculoskeletal: Negative for back pain and gait problem.  Skin: Negative for rash.  Neurological: Positive for headaches. Negative for light-headedness.  Psychiatric/Behavioral: Negative for agitation and behavioral  problems.  All other systems reviewed and are negative.   Per HPI unless specifically indicated above   Allergies as of 07/23/2020      Reactions   Aspirin Other (See Comments)   Upset tummy.  Can take ibuprofen.   Oxycodone-acetaminophen Anxiety   Can take vicodin   Tramadol Hcl Nausea Only      Medication List       Accurate as of July 23, 2020  1:33 PM. If you have any questions, ask your nurse or doctor.        STOP taking these medications   azithromycin 250 MG tablet Commonly known as: Zithromax Z-Pak Stopped by: [name] [name], MD   benzonatate 100 MG capsule Commonly known as: Lawyer Stopped by: [name] [name], MD   methylPREDNISolone 4 MG Tbpk tablet Commonly known as: MEDROL DOSEPAK Stopped by: [name] [name], MD     TAKE these medications   albuterol 108 (90 Base) MCG/ACT inhaler Commonly known as: VENTOLIN HFA TAKE 1-2 PUFFS AS NEEDED FOR WHEEZING/CHEST TIGHTNESS   ARIPiprazole 20 MG tablet Commonly known as: ABILIFY Take 30 mg by mouth.   busPIRone 10 MG tablet Commonly known as: BUSPAR Take 1 tablet (10 mg total) by mouth 2 (two) times daily.   Cholecalciferol 100 MCG (4000 UT) Caps Take 4,000 Units by mouth daily.   Emgality 120 MG/ML Soaj Generic drug: Galcanezumab-gnlm Inject 120 mg into the skin every 30 (thirty) days.   FLUoxetine HCl 60  MG Tabs Take 60 mg by mouth daily.   Fluticasone-Salmeterol 250-50 MCG/DOSE Aepb Commonly known as: ADVAIR Inhale 1 puff into the lungs every 12 (twelve) hours.   ibuprofen 800 MG tablet Commonly known as: ADVIL TAKE 1 TABLET BY MOUTH EVERY 8 HOURS AS NEEDED   levothyroxine 75 MCG tablet Commonly known as: SYNTHROID Take by mouth.   linaclotide 290 MCG Caps capsule Commonly known as: LINZESS Take 1 capsule (290 mcg total) by mouth daily before breakfast.   meclizine 25 MG tablet Commonly known as: ANTIVERT Take 1 tablet (25 mg total) by mouth 3 (three) times daily  as needed for dizziness.   mirabegron ER 25 MG Tb24 tablet Commonly known as: Myrbetriq Take 1 tablet (25 mg total) by mouth daily.   multivitamin with minerals Tabs tablet Take 1 tablet by mouth daily.   ondansetron 4 MG disintegrating tablet Commonly known as: ZOFRAN-ODT Take 1 tablet (4 mg total) by mouth every 8 (eight) hours as needed.   rizatriptan 10 MG tablet Commonly known as: MAXALT MAY REPEAT IN 2 HOURS IF NEEDED   tiZANidine 4 MG tablet Commonly known as: ZANAFLEX TAKE 1 TABLET (4 MG TOTAL) BY MOUTH EVERY 8 (EIGHT) HOURS AS NEEDED FOR MUSCLE SPASMS.        Objective:   BP 105/68   Pulse 80   Ht 5' 7.5" (1.715 m)   Wt 219 lb (99.3 kg)   SpO2 99%   BMI 33.79 kg/m   Wt Readings from Last 3 Encounters:  07/23/20 219 lb (99.3 kg)  05/13/20 218 lb (98.9 kg)  11/25/19 224 lb (101.6 kg)    Physical Exam Vitals and nursing note reviewed.  Constitutional:      General: She is not in acute distress.    Appearance: She is well-developed and well-nourished. She is not diaphoretic.  HENT:     Head:   Eyes:     Extraocular Movements: EOM normal.     Conjunctiva/sclera: Conjunctivae normal.     Pupils: Pupils are equal, round, and reactive to light.  Cardiovascular:     Rate and Rhythm: Normal rate and regular rhythm.     Pulses: Intact distal pulses.     Heart sounds: Normal heart sounds. No murmur heard.   Pulmonary:     Effort: Pulmonary effort is normal. No respiratory distress.     Breath sounds: Normal breath sounds. No wheezing.  Musculoskeletal:        General: No tenderness or edema. Normal range of motion.  Skin:    General: Skin is warm and dry.     Findings: No rash.  Neurological:     Mental Status: She is alert and oriented to person, place, and time.     Coordination: Coordination normal.  Psychiatric:        Mood and Affect: Mood and affect normal.        Behavior: Behavior normal.       Assessment & Plan:   Problem List Items  Addressed This Visit      Cardiovascular and Mediastinum   Migraine   Relevant Orders   Ambulatory referral to Neurology     Endocrine   Hypothyroidism   Relevant Orders   TSH    Other Visit Diagnoses    Right ear pain    -  Primary   Relevant Orders   Ambulatory referral to ENT   Chronic jaw pain       Relevant Orders   Ambulatory referral to  ENT      Will check thyroid levels and likely restart if that would improve his hormonal levels depending on whether thyroid result is. Referral to ENT for chronic ear and jaw pain and referral to neurology for chronic migraines.  Follow up plan: Return in about 2 months (around 09/20/2020), or if symptoms worsen or fail to improve, for Thyroid.  Counseling provided for all of the vaccine components Orders Placed This Encounter  Procedures  . TSH  . Ambulatory referral to ENT  . Ambulatory referral to Neurology    Arville Care, MD [facility] 07/23/2020, 1:33 PM    Shared

## 2020-07-24 LAB — TSH: TSH: 1.89 u[IU]/mL (ref 0.450–4.500)

## 2020-08-06 ENCOUNTER — Telehealth: Payer: Self-pay

## 2020-08-06 DIAGNOSIS — M940 Chondrocostal junction syndrome [Tietze]: Secondary | ICD-10-CM

## 2020-08-06 MED ORDER — IBUPROFEN 800 MG PO TABS: 800.0000 mg | ORAL_TABLET | Freq: Three times a day (TID) | ORAL | 1 refills | Status: DC | PRN

## 2020-08-06 NOTE — Telephone Encounter (Signed)
  Prescription Request  08/06/2020  What is the name of the medication or equipment? ibuprofen (ADVIL) 800 MG tablet    Have you contacted your pharmacy to request a refill? (if applicable) YES  Which pharmacy would you like this sent to? [facility] pt was seen by Dr. Algis Downs. On 07/26/20 and refill has not been sent to pharmacy   Patient notified that their request is being sent to the clinical staff for review and that they should receive a response within 2 business days.

## 2020-08-06 NOTE — Telephone Encounter (Signed)
Sent for pt - uses PRN and appt coming up

## 2020-08-11 ENCOUNTER — Ambulatory Visit: Payer: Commercial Managed Care - PPO

## 2020-08-11 ENCOUNTER — Other Ambulatory Visit: Payer: Self-pay

## 2020-08-11 ENCOUNTER — Encounter: Payer: Self-pay | Admitting: Family Medicine

## 2020-08-11 ENCOUNTER — Ambulatory Visit (INDEPENDENT_AMBULATORY_CARE_PROVIDER_SITE_OTHER): Payer: Commercial Managed Care - PPO | Admitting: Family Medicine

## 2020-08-11 VITALS — BP 99/64 | HR 66 | Temp 97.9°F | Ht 67.5 in | Wt 225.2 lb

## 2020-08-11 DIAGNOSIS — G43001 Migraine without aura, not intractable, with status migrainosus: Secondary | ICD-10-CM | POA: Diagnosis not present

## 2020-08-11 MED ORDER — METHYLPREDNISOLONE ACETATE 80 MG/ML IJ SUSP
80.0000 mg | Freq: Once | INTRAMUSCULAR | Status: AC
  Administered 2020-08-11: 80 mg via INTRAMUSCULAR

## 2020-08-11 MED ORDER — KETOROLAC TROMETHAMINE 60 MG/2ML IM SOLN
60.0000 mg | Freq: Once | INTRAMUSCULAR | Status: AC
  Administered 2020-08-11: 60 mg via INTRAMUSCULAR

## 2020-08-11 MED ORDER — ONDANSETRON 4 MG PO TBDP: 4.0000 mg | ORAL_TABLET | Freq: Three times a day (TID) | ORAL | 11 refills | Status: DC | PRN

## 2020-08-11 NOTE — Progress Notes (Signed)
Acute Office Visit  Subjective:    Patient ID: Pam Chen, female    DOB: Dec 09, 1970, 50 y.o.   MRN: [id number]  Chief Complaint  Patient presents with  . Migraine    HPI Patient is in today for migraine since yesterday morning. The headache is on the right side. She had vomiting yesterday. She denies vomiting today but does have nausea. She reports photophobia. She has been taking Zofran and needs a refill today. She takes Maxalt as needed and Emgalitiy. She has currently used all of her allotted Maxalt or the month. She has an appointment with neurology in a few weeks. She denies fever, changes in vision, balance, or gait.   Past Medical History:  Diagnosis Date  . Arthritis   . Current every day smoker 03/07/2017  . Diverticulitis   . GERD (gastroesophageal reflux disease)   . 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 History of adenomatous polyp of colon 05/02/2016   Adenoma 05/02/16; repeat 04/2021; Barbaraann Share, MD [facility])  . History of bipolar disorder 08/25/2019  . History of partial colectomy 03/28/2016   diverticulitis  . Hypertension   . Hypothyroid 02/02/2017  . Kidney stones   . Migraine   . Obesity (BMI 30.0-34.9) 02/02/2017  . Polysubstance dependence including opioid type drug with complication, episodic abuse (HCC) 08/25/2019  . Rotator cuff injury   . Sleep apnea    CPAP  . Tennis elbow   . Thyroid disease     Past Surgical History:  Procedure Laterality Date  . ABDOMINAL HYSTERECTOMY    . COLON SURGERY     diverticulitis  . LITHOTRIPSY    . SLEEVE GASTROPLASTY      Family History  Problem Relation Age of Onset  . Hypertension Father   . Sleep apnea Father   . Cancer Maternal Grandmother        breast  . Alzheimer's disease Maternal Grandmother   . Diabetes Maternal Grandfather   . Cancer Paternal Grandmother        ovarian  . Hypertension Paternal Grandfather   . Heart disease Paternal Grandfather    . Diabetes Paternal Grandfather   . Hyperlipidemia Brother     Social History   Socioeconomic History  . Marital status: Single    Spouse name: Not on file  . Number of children: Not on file  . Years of education: Not on file  . Highest education level: Not on file  Occupational History  . Not on file  Tobacco Use  . Smoking status: Current Every Day Smoker    Packs/day: 0.30    Types: Cigarettes  . Smokeless tobacco: Never Used  . Tobacco comment: down to 3-4 cigarettes per day - trying to stop  Vaping Use  . Vaping Use: Former  Substance and Sexual Activity  . Alcohol use: No  . Drug use: Not on file  . Sexual activity: Not on file  Other Topics Concern  . Not on file  Social History Narrative   Lives at home with parents and son.   Right-handed.   1-2 cups caffeine daily.   Social Determinants of Health   Financial Resource Strain: Not on file  Food Insecurity: Not on file  Transportation Needs: Not on file  Physical Activity: Not on file  Stress: Not on file  Social Connections: Not on file  Intimate Partner Violence: Not on file    Outpatient Medications Prior to Visit  Medication Sig Dispense Refill  . albuterol (VENTOLIN HFA) 108 (90 Base) MCG/ACT inhaler TAKE 1-2 PUFFS AS NEEDED FOR WHEEZING/CHEST TIGHTNESS 18 g 1  . ARIPiprazole (ABILIFY) 20 MG tablet Take 30 mg by mouth.    . busPIRone (BUSPAR) 10 MG tablet Take 1 tablet (10 mg total) by mouth 2 (two) times daily. 180 tablet 0  . Cholecalciferol 100 MCG (4000 UT) CAPS Take 4,000 Units by mouth daily.    Marland Kitchen FLUoxetine HCl 60 MG TABS Take 60 mg by mouth daily.    . Fluticasone-Salmeterol (ADVAIR) 250-50 MCG/DOSE AEPB Inhale 1 puff into the lungs every 12 (twelve) hours. 60 each 2  . Galcanezumab-gnlm (EMGALITY) 120 MG/ML SOAJ Inject 120 mg into the skin every 30 (thirty) days. 1.12 mL 11  . ibuprofen (ADVIL) 800 MG tablet Take 1 tablet (800 mg total) by mouth every 8 (eight) hours as needed. 60 tablet 1   . linaclotide (LINZESS) 290 MCG CAPS capsule Take 1 capsule (290 mcg total) by mouth daily before breakfast. 90 capsule 1  . mirabegron ER (MYRBETRIQ) 25 MG TB24 tablet Take 1 tablet (25 mg total) by mouth daily. 30 tablet 5  . Multiple Vitamin (MULTIVITAMIN WITH MINERALS) TABS tablet Take 1 tablet by mouth daily.    . ondansetron (ZOFRAN-ODT) 4 MG disintegrating tablet Take 1 tablet (4 mg total) by mouth every 8 (eight) hours as needed. 20 tablet 11  . rizatriptan (MAXALT) 10 MG tablet MAY REPEAT IN 2 HOURS IF NEEDED 10 tablet 0  . levothyroxine (SYNTHROID) 75 MCG tablet Take by mouth.    . meclizine (ANTIVERT) 25 MG tablet Take 1 tablet (25 mg total) by mouth 3 (three) times daily as needed for dizziness. 30 tablet 0  . tiZANidine (ZANAFLEX) 4 MG tablet TAKE 1 TABLET (4 MG TOTAL) BY MOUTH EVERY 8 (EIGHT) HOURS AS NEEDED FOR MUSCLE SPASMS. 60 tablet 1   No facility-administered medications prior to visit.    Allergies  Allergen Reactions  . Aspirin Other (See Comments)    Upset tummy.  Can take ibuprofen.  . Oxycodone-Acetaminophen Anxiety    Can take vicodin  . Tramadol Hcl Nausea Only    Review of Systems As per HPI.    Objective:    Physical Exam Vitals and nursing note reviewed.  Constitutional:      Appearance: She is not toxic-appearing or diaphoretic.  HENT:     Head: Normocephalic and atraumatic.  Pulmonary:     Effort: Pulmonary effort is normal. No respiratory distress.  Musculoskeletal:     Cervical back: Normal range of motion. No rigidity.     Right lower leg: No edema.     Left lower leg: No edema.  Skin:    General: Skin is warm and dry.  Neurological:     General: No focal deficit present.     Mental Status: She is alert and oriented to person, place, and time.     Motor: No weakness.     Gait: Gait normal.  Psychiatric:        Mood and Affect: Mood normal.        Behavior: Behavior normal.        Thought Content: Thought content normal.     BP  99/64   Pulse 66   Temp 97.9 F (36.6 C) (Temporal)   Ht 5' 7.5" (1.715 m)   Wt 225 lb 4 oz (102.2 kg)   BMI 34.76 kg/m  Wt Readings from Last 3 Encounters:  08/11/20  225 lb 4 oz (102.2 kg)  07/23/20 219 lb (99.3 kg)  05/13/20 218 lb (98.9 kg)    There are no preventive care reminders to display for this patient.  There are no preventive care reminders to display for this patient.   Lab Results  Component Value Date   TSH 1.890 07/23/2020   Lab Results  Component Value Date   WBC 7.0 05/13/2020   HGB 13.8 05/13/2020   HCT 40.3 05/13/2020   MCV 89 05/13/2020   PLT 218 05/13/2020   Lab Results  Component Value Date   NA 139 05/13/2020   K 4.3 05/13/2020   CO2 27 05/13/2020   GLUCOSE 99 05/13/2020   BUN 11 05/13/2020   CREATININE 0.84 05/13/2020   BILITOT <0.2 05/13/2020   ALKPHOS 69 05/13/2020   AST 16 05/13/2020   ALT 14 05/13/2020   PROT 6.2 05/13/2020   ALBUMIN 4.0 05/13/2020   CALCIUM 9.1 05/13/2020   ANIONGAP 9 01/22/2019   Lab Results  Component Value Date   CHOL 199 05/13/2020   Lab Results  Component Value Date   HDL 44 05/13/2020   Lab Results  Component Value Date   LDLCALC 130 (H) 05/13/2020   Lab Results  Component Value Date   TRIG 137 05/13/2020   Lab Results  Component Value Date   CHOLHDL 4.5 (H) 05/13/2020   No results found for: HGBA1C     Assessment & Plan:   Lucita was seen today for migraine.  Diagnoses and all orders for this visit:  Migraine without aura and with status migrainosus, not intractable Steroid and Tordal IM injection today in office. Refill of zofran provided. Samples of Nurtec given. Work note provided. Keep scheduled appointment with neurology.  -     ondansetron (ZOFRAN-ODT) 4 MG disintegrating tablet; Take 1 tablet (4 mg total) by mouth every 8 (eight) hours as needed. -     methylPREDNISolone acetate (DEPO-MEDROL) injection 80 mg -     ketorolac (TORADOL) injection 60 mg  Return to office for new  or worsening symptoms, or if symptoms persist.   Gabriel Earing, FNP

## 2020-08-11 NOTE — Patient Instructions (Signed)

## 2020-08-17 ENCOUNTER — Telehealth: Payer: Self-pay

## 2020-08-17 NOTE — Telephone Encounter (Signed)
Pt is scheduled for a 30 min hospital follow up with Je on 08/20/20.  Pt has had labs, CT. She has been told by her dentist that she needs to see an oral Careers adviser. Pt states that she does not have the finances at this time to see a Careers adviser. They are requiring a payment up front which she can not do at this time.  Explained to pt that I was not sure what we could do at this time since she is requiring a surgical procedure. Pt was addiment about wanting an appt to get some relief.

## 2020-08-19 ENCOUNTER — Other Ambulatory Visit: Payer: Self-pay | Admitting: Family Medicine

## 2020-08-19 DIAGNOSIS — G43009 Migraine without aura, not intractable, without status migrainosus: Secondary | ICD-10-CM

## 2020-08-20 ENCOUNTER — Other Ambulatory Visit: Payer: Self-pay

## 2020-08-20 ENCOUNTER — Encounter: Payer: Self-pay | Admitting: Nurse Practitioner

## 2020-08-20 ENCOUNTER — Ambulatory Visit (INDEPENDENT_AMBULATORY_CARE_PROVIDER_SITE_OTHER): Payer: Commercial Managed Care - PPO | Admitting: Nurse Practitioner

## 2020-08-20 VITALS — BP 128/77 | HR 68 | Temp 97.1°F | Ht 67.0 in | Wt 215.6 lb

## 2020-08-20 DIAGNOSIS — K5900 Constipation, unspecified: Secondary | ICD-10-CM | POA: Diagnosis not present

## 2020-08-20 DIAGNOSIS — G43909 Migraine, unspecified, not intractable, without status migrainosus: Secondary | ICD-10-CM

## 2020-08-20 MED ORDER — PROPRANOLOL HCL 10 MG PO TABS: 10.0000 mg | ORAL_TABLET | Freq: Three times a day (TID) | ORAL | 2 refills | Status: DC

## 2020-08-20 MED ORDER — MINERAL OIL RE ENEM
1.0000 | ENEMA | Freq: Once | RECTAL | 0 refills | Status: AC
Start: 2020-08-20 — End: 2020-08-20

## 2020-08-20 NOTE — Assessment & Plan Note (Signed)
Patient reports not having a bowel movement in 5 days.  Patient is already on Linzess.  Education provided to patient that pain medications given well in the emergency department could worsen constipation.  Mineral enema ordered Rx sent to pharmacy.  Follow-up with worsening unresolved symptoms

## 2020-08-20 NOTE — Assessment & Plan Note (Signed)
Migraine symptoms not well controlled.  Started patient on propranolol 10 mg tablet 3 times daily. Follow-up with worsening hours of symptoms. Rx sent to pharmacy Education provided to patient with printed handouts given.

## 2020-08-20 NOTE — Progress Notes (Signed)
Follow Up Note  RE: Pam Chen Mood MRN: [id number] DOB: 1970/12/21 Date of Office Visit: 08/20/2020  Referring provider: [name], [name], MD Primary care provider: [name], [name], MD  Chief Complaint: Hospitalization Follow-up (For migraines went to [facility] ) and Temporomandibular Joint Pain  History of Present Illness: I had the pleasure of seeing Pam Chen for a follow up visit on 08/20/2020. She is a 50 y.o. female, who is being followed for migraine and TMJ pain after hospital discharge.  Patient reports in the last 3 years migraine is well controlled and she stops taking propranolol.  But in the last few weeks migraines have become worsened, increased stress that also worsened her TMJ pain.  Patient reports she has not been able to go to the dentist for a mouthguard but will do that as soon as she is able to pay for one. Today is a regular follow up visit.    Assessment and Plan: Mareta is a 50 y.o. female with: Migraine Migraine symptoms not well controlled.  Started patient on propranolol 10 mg tablet 3 times daily. Follow-up with worsening hours of symptoms. Rx sent to pharmacy Education provided to patient with printed handouts given.  Constipation Patient reports not having a bowel movement in 5 days.  Patient is already on Linzess.  Education provided to patient that pain medications given well in the emergency department could worsen constipation.  Mineral enema ordered Rx sent to pharmacy.  Follow-up with worsening unresolved symptoms  Return if symptoms worsen or fail to improve.  Meds ordered this encounter  Medications  . propranolol (INDERAL) 10 MG tablet    Sig: Take 1 tablet (10 mg total) by mouth 3 (three) times daily.    Dispense:  90 tablet    Refill:  2    Order Specific Question:   Supervising Provider    Answer:   Raliegh Ip [[id number]]  . mineral oil enema    Sig: Place 133 mLs (1 enema total) rectally once for 1 dose.    Dispense:  133  mL    Refill:  0    Order Specific Question:   Supervising Provider    Answer:   Raliegh Ip [[id number]]   Lab Orders  No laboratory test(s) ordered today    Diagnostics:  .   Medication List:  Current Outpatient Medications  Medication Sig Dispense Refill  . albuterol (VENTOLIN HFA) 108 (90 Base) MCG/ACT inhaler TAKE 1-2 PUFFS AS NEEDED FOR WHEEZING/CHEST TIGHTNESS 18 g 1  . ARIPiprazole (ABILIFY) 20 MG tablet Take 30 mg by mouth.    . Cholecalciferol 100 MCG (4000 UT) CAPS Take 4,000 Units by mouth daily.    Marland Kitchen FLUoxetine HCl 60 MG TABS TAKE 1 TABLET BY MOUTH EVERY DAY 30 tablet 11  . Fluticasone-Salmeterol (ADVAIR) 250-50 MCG/DOSE AEPB Inhale 1 puff into the lungs every 12 (twelve) hours. 60 each 2  . Galcanezumab-gnlm (EMGALITY) 120 MG/ML SOAJ Inject 120 mg into the skin every 30 (thirty) days. 1.12 mL 11  . ibuprofen (ADVIL) 800 MG tablet Take 1 tablet (800 mg total) by mouth every 8 (eight) hours as needed. 60 tablet 1  . linaclotide (LINZESS) 290 MCG CAPS capsule Take 1 capsule (290 mcg total) by mouth daily before breakfast. 90 capsule 1  . mirabegron ER (MYRBETRIQ) 25 MG TB24 tablet Take 1 tablet (25 mg total) by mouth daily. 30 tablet 5  . Multiple Vitamin (MULTIVITAMIN WITH MINERALS) TABS tablet Take 1 tablet by mouth daily.    Marland Kitchen  ondansetron (ZOFRAN-ODT) 4 MG disintegrating tablet Take 1 tablet (4 mg total) by mouth every 8 (eight) hours as needed. 20 tablet 11  . rizatriptan (MAXALT) 10 MG tablet TAKE 1 TABLET AS DIRECTED AS NEEDED. MAY REPEAT ONCE IN 2 HOURS IF NEEDED 10 tablet 0   No current facility-administered medications for this visit.   Allergies: Allergies  Allergen Reactions  . Aspirin Other (See Comments)    Upset tummy.  Can take ibuprofen.  . Oxycodone-Acetaminophen Anxiety    Can take vicodin  . Tramadol Hcl Nausea Only   I reviewed her past medical history, social history, family history, and environmental history and no significant changes  have been reported from her previous visit.  Review of Systems  Constitutional: Negative for fatigue and fever.  HENT: Negative.   Eyes: Negative.   Respiratory: Negative.   Cardiovascular: Negative.   Gastrointestinal: Negative.   Genitourinary: Negative.   Musculoskeletal: Negative.   Neurological: Positive for headaches. Negative for dizziness and light-headedness.  All other systems reviewed and are negative.  Objective: BP 128/77   Pulse 68   Temp (!) 97.1 F (36.2 C) (Temporal)   Ht 5\' 7"  (1.702 m)   Wt 215 lb 9.6 oz (97.8 kg)   BMI 33.77 kg/m  Body mass index is 33.77 kg/m. Physical Exam Vitals reviewed.  Constitutional:      Appearance: Normal appearance.  HENT:     Head: Normocephalic.     Nose: Nose normal.  Eyes:     Conjunctiva/sclera: Conjunctivae normal.  Cardiovascular:     Pulses: Normal pulses.     Heart sounds: Normal heart sounds.  Pulmonary:     Effort: Pulmonary effort is normal.     Breath sounds: Normal breath sounds.  Abdominal:     General: Bowel sounds are normal.  Musculoskeletal:        General: Normal range of motion.  Neurological:     Mental Status: She is alert and oriented to person, place, and time.     Comments: Headache  Psychiatric:        Behavior: Behavior normal.    Previous notes and tests were reviewed. The plan was reviewed with the patient/family, and all questions/concern were addressed.  It was my pleasure to see Pam Chen today and participate in her care. Please feel free to contact me with any questions or concerns.  Sincerely,   Scherry Ran NP

## 2020-08-20 NOTE — Patient Instructions (Addendum)
Constipation, Adult Constipation is when a person has trouble pooping (having a bowel movement). When you have this condition, you may poop fewer than 3 times a week. Your poop (stool) may also be dry, hard, or bigger than normal. Follow these instructions at home: Eating and drinking  Eat foods that have a lot of fiber, such as: ? Fresh fruits and vegetables. ? Whole grains. ? Beans.  Eat less of foods that are low in fiber and high in fat and sugar, such as: ? [place] fries. ? Hamburgers. ? Cookies. ? Candy. ? Soda.  Drink enough fluid to keep your pee (urine) pale yellow.   General instructions  Exercise regularly or as told by your doctor. Try to do 150 minutes of exercise each week.  Go to the restroom when you feel like you need to poop. Do not hold it in.  Take over-the-counter and prescription medicines only as told by your doctor. These include any fiber supplements.  When you poop: ? Do deep breathing while relaxing your lower belly (abdomen). ? Relax your pelvic floor. The pelvic floor is a group of muscles that support the rectum, bladder, and intestines (as well as the uterus in women).  Watch your condition for any changes. Tell your doctor if you notice any.  Keep all follow-up visits as told by your doctor. This is important. Contact a doctor if:  You have pain that gets worse.  You have a fever.  You have not pooped for 4 days.  You vomit.  You are not hungry.  You lose weight.  You are bleeding from the opening of the butt (anus).  You have thin, pencil-like poop. Get help right away if:  You have a fever, and your symptoms suddenly get worse.  You leak poop or have blood in your poop.  Your belly feels hard or bigger than normal (bloated).  You have very bad belly pain.  You feel dizzy or you faint. Summary  Constipation is when a person poops fewer than 3 times a week, has trouble pooping, or has poop that is dry, hard, or bigger than  normal.  Eat foods that have a lot of fiber.  Drink enough fluid to keep your pee (urine) pale yellow.  Take over-the-counter and prescription medicines only as told by your doctor. These include any fiber supplements. This information is not intended to replace advice given to you by your health care provider. Make sure you discuss any questions you have with your health care provider. Document Revised: 04/02/2019 Document Reviewed: 04/02/2019 Elsevier Patient Education  2021 Elsevier Inc. Oral and maxillofacial surgery (3rd ed.). Elsevier. Retrieved from https://www.clinicalkey.com">  Jaw Range of Motion Exercises Jaw range of motion exercises are exercises that help your jaw move better. Exercises that help you have good posture (postural exercises) also help relieve jaw discomfort. These are often done along with range of motion exercises. These exercises can help prevent or improve:  Difficulty opening your mouth.  Pain in your jaw while it is open or closed.  Temporomandibular joint (TMJ) pain.  Headache caused by jaw tension. Take other actions to prevent or relieve jaw pain, such as:  Avoiding things that cause or increase jaw pain. This may include: ? Chewing gum or eating hard foods. ? Clenching your jaw or teeth, grinding your teeth, or keeping tension in your jaw muscles. ? Opening your mouth wide, such as for a big yawn. ? Leaning on your jaw, such as resting your jaw in  your hand while leaning on a desk.  Putting ice on your jaw. ? Put ice in a plastic bag. ? Place a towel between your skin and the bag. ? Leave the ice on for 10-15 minutes, 2-3 times a day. Only do jaw exercises that your health care provider approves of. Only move your jaw as far as it can comfortably go in each direction. Do not move your jaw into positions that cause pain. Range of motion exercises Repeat each of these exercises 8 times, 1-2 times a day, or as told by your health care  provider. Exercise A: Forward protrusion 1. Push your jaw forward. Hold this position for 1-2 seconds. 2. Allow your jaw to return to its normal position and rest it there for 1-2 seconds. Exercise B: Controlled opening 1. Stand or sit in front of a mirror. Place your tongue on the roof of your mouth, just behind your top teeth. 2. Keeping your tongue on the roof of your mouth, slowly open and close your mouth. 3. While you open and close your mouth, watch your jaw in the mirror. Try to keep your jaw from moving to one side or the other. Exercise C: Right and left motion 1. Move your jaw right. Hold this position for 1-2 seconds. Allow your jaw to return to its normal position, and rest it there for 1-2 seconds. 2. Move your jaw left. Hold this position for 1-2 seconds. Allow your jaw to return to its normal position, and rest it there for 1-2 seconds. Postural exercises Exercise A: Chin tucks 1. You can do this exercise sitting, standing, or lying down. 2. Move your head straight back, keeping your head level. You can guide the movement by placing your fingers on your chin to push your jaw back in an even motion. You should be able to feel a double chin form at the end of the motion. 3. Hold this position for 5 seconds. Repeat 10-15 times. Exercise B: Shoulder blade squeeze 1. Sit or stand. 2. Bend your elbows to about 90 degrees, which is the shape of a capital letter "L." Keep your upper arms by your body. 3. Squeeze your shoulder blades down and back, as though you were trying to touch your elbows behind you. Do not shrug your shoulders or move your head. 4. Hold this position for 5 seconds. Repeat 10-15 times. Exercise C: Chest stretch 1. Stand facing a corner. 2. Put both of your hands and your forearms on the wall, with your arms wide apart. 3. Make sure your arms are at a 90-degree angle to your body. This means that you should hold your arms straight out from your body, level with  the floor. 4. Step in toward the corner. Do not lean in. 5. Hold this position for 30 seconds. Repeat 3 times. Contact a health care provider if you have:  Jaw pain that is new or gets worse.  Clicking or popping sounds while doing the exercises. Get help right away if:  Your jaw is stuck in one place and you cannot move it.  You cannot open or close your mouth. Summary  Jaw range of motion exercises are exercises that help your jaw move better.  Take actions to prevent or relieve jaw pain: limit chewing gum or eating hard foods; clenching your jaw or teeth; or leaning on your jaw, such as resting your jaw in your hand while leaning on a desk.  Repeat each of the jaw range of motion  exercises 8 times, 1-2 times a day, or as told by your health care provider.  Contact a health care provider if you have clicking or popping sounds while doing the exercises. This information is not intended to replace advice given to you by your health care provider. Make sure you discuss any questions you have with your health care provider. Document Revised: 02/06/2020 Document Reviewed: 02/06/2020 Elsevier Patient Education  2021 Elsevier Inc. Migraine Headache A migraine headache is a very strong throbbing pain on one side or both sides of your head. This type of headache can also cause other symptoms. It can last from 4 hours to 3 days. Talk with your doctor about what things may bring on (trigger) this condition. What are the causes? The exact cause of this condition is not known. This condition may be triggered or caused by:  Drinking alcohol.  Smoking.  Taking medicines, such as: ? Medicine used to treat chest pain (nitroglycerin). ? Birth control pills. ? Estrogen. ? Some blood pressure medicines.  Eating or drinking certain products.  Doing physical activity. Other things that may trigger a migraine headache include:  Having a menstrual  period.  Pregnancy.  Hunger.  Stress.  Not getting enough sleep or getting too much sleep.  Weather changes.  Tiredness (fatigue). What increases the risk?  Being 47-52 years old.  Being female.  Having a family history of migraine headaches.  Being Caucasian.  Having depression or anxiety.  Being very overweight. What are the signs or symptoms?  A throbbing pain. This pain may: ? Happen in any area of the head, such as on one side or both sides. ? Make it hard to do daily activities. ? Get worse with physical activity. ? Get worse around bright lights or loud noises.  Other symptoms may include: ? Feeling sick to your stomach (nauseous). ? Vomiting. ? Dizziness. ? Being sensitive to bright lights, loud noises, or smells.  Before you get a migraine headache, you may get warning signs (an aura). An aura may include: ? Seeing flashing lights or having blind spots. ? Seeing bright spots, halos, or zigzag lines. ? Having tunnel vision or blurred vision. ? Having numbness or a tingling feeling. ? Having trouble talking. ? Having weak muscles.  Some people have symptoms after a migraine headache (postdromal phase), such as: ? Tiredness. ? Trouble thinking (concentrating). How is this treated?  Taking medicines that: ? Relieve pain. ? Relieve the feeling of being sick to your stomach. ? Prevent migraine headaches.  Treatment may also include: ? Having acupuncture. ? Avoiding foods that bring on migraine headaches. ? Learning ways to control your body functions (biofeedback). ? Therapy to help you know and deal with negative thoughts (cognitive behavioral therapy). Follow these instructions at home: Medicines  Take over-the-counter and prescription medicines only as told by your doctor.  Ask your doctor if the medicine prescribed to you: ? Requires you to avoid driving or using heavy machinery. ? Can cause trouble pooping (constipation). You may need to  take these steps to prevent or treat trouble pooping:  Drink enough fluid to keep your pee (urine) pale yellow.  Take over-the-counter or prescription medicines.  Eat foods that are high in fiber. These include beans, whole grains, and fresh fruits and vegetables.  Limit foods that are high in fat and sugar. These include fried or sweet foods. Lifestyle  Do not drink alcohol.  Do not use any products that contain nicotine or tobacco, such as cigarettes,  e-cigarettes, and chewing tobacco. If you need help quitting, ask your doctor.  Get at least 8 hours of sleep every night.  Limit and deal with stress. General instructions  Keep a journal to find out what may bring on your migraine headaches. For example, write down: ? What you eat and drink. ? How much sleep you get. ? Any change in what you eat or drink. ? Any change in your medicines.  If you have a migraine headache: ? Avoid things that make your symptoms worse, such as bright lights. ? It may help to lie down in a dark, quiet room. ? Do not drive or use heavy machinery. ? Ask your doctor what activities are safe for you.  Keep all follow-up visits as told by your doctor. This is important.      Contact a doctor if:  You get a migraine headache that is different or worse than others you have had.  You have more than 15 headache days in one month. Get help right away if:  Your migraine headache gets very bad.  Your migraine headache lasts longer than 72 hours.  You have a fever.  You have a stiff neck.  You have trouble seeing.  Your muscles feel weak or like you cannot control them.  You start to lose your balance a lot.  You start to have trouble walking.  You pass out (faint).  You have a seizure. Summary  A migraine headache is a very strong throbbing pain on one side or both sides of your head. These headaches can also cause other symptoms.  This condition may be treated with medicines and  changes to your lifestyle.  Keep a journal to find out what may bring on your migraine headaches.  Contact a doctor if you get a migraine headache that is different or worse than others you have had.  Contact your doctor if you have more than 15 headache days in a month. This information is not intended to replace advice given to you by your health care provider. Make sure you discuss any questions you have with your health care provider. Document Revised: 09/06/2018 Document Reviewed: 06/27/2018 Elsevier Patient Education  2021 [employer].

## 2020-08-31 ENCOUNTER — Ambulatory Visit (INDEPENDENT_AMBULATORY_CARE_PROVIDER_SITE_OTHER): Payer: Commercial Managed Care - PPO | Admitting: Neurology

## 2020-08-31 ENCOUNTER — Encounter: Payer: Self-pay | Admitting: Neurology

## 2020-08-31 VITALS — BP 102/66 | HR 67 | Ht 67.0 in | Wt 219.5 lb

## 2020-08-31 DIAGNOSIS — G43009 Migraine without aura, not intractable, without status migrainosus: Secondary | ICD-10-CM

## 2020-08-31 DIAGNOSIS — G43109 Migraine with aura, not intractable, without status migrainosus: Secondary | ICD-10-CM | POA: Diagnosis not present

## 2020-08-31 MED ORDER — EMGALITY 120 MG/ML ~~LOC~~ SOAJ: 120.0000 mg | SUBCUTANEOUS | 4 refills | Status: DC

## 2020-08-31 MED ORDER — TOPIRAMATE 50 MG PO TABS
100.0000 mg | ORAL_TABLET | Freq: Every day | ORAL | 11 refills | Status: DC
Start: 2020-08-31 — End: 2021-03-30

## 2020-08-31 MED ORDER — RIZATRIPTAN BENZOATE 10 MG PO TBDP: 10.0000 mg | ORAL_TABLET | ORAL | 6 refills | Status: DC | PRN

## 2020-08-31 MED ORDER — PROPRANOLOL HCL ER 60 MG PO CP24: 60.0000 mg | ORAL_CAPSULE | Freq: Every day | ORAL | 11 refills | Status: DC

## 2020-08-31 NOTE — Patient Instructions (Signed)
You may take Maxalt at the beginning of visual aura even before the onset of headache.  May combine it with over-the-counter Aleve 1 to 2 tablets, Zofran for nausea Tizanidine as muscle relaxant  Sleep usually helps the headaches.

## 2020-08-31 NOTE — Progress Notes (Signed)
Chief Complaint  Patient presents with  . New Patient (Initial Visit)    She has been referred back to discuss migraine management. Last seen in 2019. She was previously on topiramate and it was helpful. However, she stopped taking all her medications in 2020. She estimates having at least two migraines each week. Rizatriptan works well if she catches it early. Her PCP just started her on propranolol 1m, one tablet TID last week. She feels like it is already helping.      ASSESSMENT AND PLAN  Pam Chen a 50y.o. Chen   Chronic migraine with aura Depression anxiety, polypharmacy treatment  Keep Emgality 120 mg every 30 days as preventive medications  Increase Inderal LA to 60 mg every day  Add on Topamax 50 mg titrating to 100 mg every night as preventive medications  Avoid frequent over-the-counter ibuprofen use  For severe prolonged headaches, may try combination of Maxalt 10 mg plus tizanidine, plus Zofran, and Aleve as needed  Return to clinic in 3 months with nurse practitioner [name]   DIAGNOSTIC DATA (LABS, IMAGING, TESTING) - I reviewed patient records, labs, notes, testing and imaging myself where available.  Laboratory evaluation March 2022: Elevated WBC 12.5, neutrophil 84.2, CMP showed elevated glucose 218, creatinine 1.02, normal ESR 5, TSH 1.89, lipid panel LDL 130,  HISTORICAL  Pam Chen, seen in request by her primary care physician Dr.  DWarrick Parisian JFransisca Chen for continued care of her migraine, I saw her previously in March 2019   I reviewed and summarized the referring note.PMHx. Asthma Smoke 1/2 ppd Depression, anxiety,  Chronic migraine Urinary frequency, urgency. History of hypothyroidism, was on supplement, but not anymore  She reported a history of migraine headaches since teenager, gradually getting worse, her typical migraines are left lateralized severe pounding headache with associated light noise sensitivity,  nauseous, lasting for a few hours, responding to Maxalt, but she also has rebound headache few hours later.  Her headache gradually gets worse over the years, have 2-3 typical migraine headaches in a week, mild to moderate ones in between, 1 month, she would use up all 9 tablets of Maxalt, and frequent over-the-counter Excedrin Migraine, double strength Tylenol use 4-5 times each week,  She also has to use Maxalt twice in the day for rebound headaches few hours later,  In March 2019, she had to severe prolonged migraine headache, lasting for 2 weeks, has to go to emergency room on March 1, and March 8, eventually improved with emergency room IV treatment, Decadron 10 mg, Haldol 2.5 mg, Toradol 30 mg, magnesium 2 g once,  She was able to tolerate Topamax 50 mg 2 tablets every night previously for migraine prevention, which helped her some, was recently switched to Inderal LA 120 mg every night, which also provides some help.  She also has history of obstructive sleep apnea, using CPAP machine, she had 130 pound weight loss over the past   She was started on Topamax 100 mg twice a day which was helpful, but she stopped taking all her medication in 2020, since then complains worsening headache, also mood disorder  She was restarted on propanolol 10 mg 3 times a day, also on Emgality 120 mg every months, despite all those preventive medications, she complains of daily headaches, mild to moderate, couple times a  week, it would exacerbate to more severe right lateralized severe pounding headache with light noise sensitivity, she has been taking frequent ibuprofen at least 3-4  times each week,  Maxalt as needed works well most of the time  REVIEW OF SYSTEMS: Full 14 system review of systems performed and notable only for as above All other review of systems were negative.  PHYSICAL EXAM   Vitals:   08/31/20 0823  Weight: 219 lb 8 oz (99.6 kg)  Height: '5\' 7"'  (1.702 m)   Not recorded      Body mass index is 34.38 kg/m.  PHYSICAL EXAMNIATION:  Gen: NAD, conversant, well nourised, well groomed                     Cardiovascular: Regular rate rhythm, no peripheral edema, warm, nontender. Eyes: Conjunctivae clear without exudates or hemorrhage Neck: Supple, no carotid bruits. Pulmonary: Clear to auscultation bilaterally   NEUROLOGICAL EXAM:  MENTAL STATUS: Tired looking middle-aged Chen Speech:    Speech is normal; fluent and spontaneous with normal comprehension.  Cognition:     Orientation to time, place and person     Normal recent and remote memory     Normal Attention span and concentration     Normal Language, naming, repeating,spontaneous speech     Fund of knowledge   CRANIAL NERVES: CN II: Visual fields are full to confrontation. Pupils are round equal and briskly reactive to light. CN III, IV, VI: extraocular movement are normal. No ptosis. CN V: Facial sensation is intact to light touch CN VII: Face is symmetric with normal eye closure  CN VIII: Hearing is normal to causal conversation. CN IX, X: Phonation is normal. CN XI: Head turning and shoulder shrug are intact  MOTOR: There is no pronator drift of out-stretched arms. Muscle bulk and tone are normal. Muscle strength is normal.  REFLEXES: Reflexes are 2+ and symmetric at the biceps, triceps, knees, and ankles. Plantar responses are flexor.  SENSORY: Intact to light touch, pinprick and vibratory sensation are intact in fingers and toes.  COORDINATION: There is no trunk or limb dysmetria noted.  GAIT/STANCE: Posture is normal. Gait is steady with normal steps, base, arm swing, and turning. Heel and toe walking are normal. Tandem gait is normal.  Romberg is absent.  ALLERGIES: Allergies  Allergen Reactions  . Aspirin Other (See Comments)    Upset tummy.  Can take ibuprofen.  . Oxycodone-Acetaminophen Anxiety    Can take vicodin  . Tramadol Hcl Nausea Only    HOME  MEDICATIONS: Current Outpatient Medications  Medication Sig Dispense Refill  . albuterol (VENTOLIN HFA) 108 (90 Base) MCG/ACT inhaler TAKE 1-2 PUFFS AS NEEDED FOR WHEEZING/CHEST TIGHTNESS 18 g 1  . ARIPiprazole (ABILIFY) 20 MG tablet Take 30 mg by mouth.    . Cholecalciferol 100 MCG (4000 UT) CAPS Take 4,000 Units by mouth daily.    Marland Kitchen FLUoxetine HCl 60 MG TABS TAKE 1 TABLET BY MOUTH EVERY DAY 30 tablet 11  . Fluticasone-Salmeterol (ADVAIR) 250-50 MCG/DOSE AEPB Inhale 1 puff into the lungs every 12 (twelve) hours. 60 each 2  . Galcanezumab-gnlm (EMGALITY) 120 MG/ML SOAJ Inject 120 mg into the skin every 30 (thirty) days. 1.12 mL 11  . ibuprofen (ADVIL) 800 MG tablet Take 1 tablet (800 mg total) by mouth every 8 (eight) hours as needed. 60 tablet 1  . linaclotide (LINZESS) 290 MCG CAPS capsule Take 1 capsule (290 mcg total) by mouth daily before breakfast. 90 capsule 1  . mirabegron ER (MYRBETRIQ) 25 MG TB24 tablet Take 1 tablet (25 mg total) by mouth daily. 30 tablet 5  . Multiple Vitamin (  MULTIVITAMIN WITH MINERALS) TABS tablet Take 1 tablet by mouth daily.    . ondansetron (ZOFRAN-ODT) 4 MG disintegrating tablet Take 1 tablet (4 mg total) by mouth every 8 (eight) hours as needed. 20 tablet 11  . propranolol (INDERAL) 10 MG tablet Take 1 tablet (10 mg total) by mouth 3 (three) times daily. 90 tablet 2  . rizatriptan (MAXALT) 10 MG tablet TAKE 1 TABLET AS DIRECTED AS NEEDED. MAY REPEAT ONCE IN 2 HOURS IF NEEDED 10 tablet 0   No current facility-administered medications for this visit.    PAST MEDICAL HISTORY: Past Medical History:  Diagnosis Date  . Arthritis   . Current every day smoker 03/07/2017  . Diverticulitis   . GERD (gastroesophageal reflux disease)   . 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 History of adenomatous polyp of colon 05/02/2016   Adenoma 05/02/16; repeat 04/2021; [name], MD [facility])  . History of bipolar disorder  08/25/2019  . History of partial colectomy 03/28/2016   diverticulitis  . Hypertension   . Hypothyroid 02/02/2017  . Kidney stones   . Migraine   . Obesity (BMI 30.0-34.9) 02/02/2017  . Polysubstance dependence including opioid type drug with complication, episodic abuse ([place]) 08/25/2019  . Rotator cuff injury   . Sleep apnea    CPAP  . Tennis elbow   . Thyroid disease     PAST SURGICAL HISTORY: Past Surgical History:  Procedure Laterality Date  . ABDOMINAL HYSTERECTOMY    . COLON SURGERY     diverticulitis  . LITHOTRIPSY    . SLEEVE GASTROPLASTY      FAMILY HISTORY: Family History  Problem Relation Age of Onset  . Healthy Mother   . Hypertension Father   . Sleep apnea Father   . Cancer Maternal Grandmother        breast  . Alzheimer's disease Maternal Grandmother   . Diabetes Maternal Grandfather   . Cancer Paternal Grandmother        ovarian  . Hypertension Paternal Grandfather   . Heart disease Paternal Grandfather   . Diabetes Paternal Grandfather   . Hyperlipidemia Brother     SOCIAL HISTORY: Social History   Socioeconomic History  . Marital status: Single    Spouse name: Not on file  . Number of children: 3  . Years of education: GED  . Highest education level: Not on file  Occupational History  . Occupation: drives fork lift/loads trucks in [employer]  Tobacco Use  . Smoking status: Current Every Day Smoker    Packs/day: 1.00    Types: Cigarettes  . Smokeless tobacco: Never Used  Vaping Use  . Vaping Use: Former  Substance and Sexual Activity  . Alcohol use: No  . Drug use: Not Currently    Types: Methamphetamines  . Sexual activity: Not on file  Other Topics Concern  . Not on file  Social History Narrative   Lives at home with fiance and son.   Right-handed.   2 cups coffee, 1 glass of sweet tea per day.   Social Determinants of Health   Financial Resource Strain: Not on file  Food Insecurity: Not on file  Transportation Needs: Not on  file  Physical Activity: Not on file  Stress: Not on file  Social Connections: Not on file  Intimate Partner Violence: Not on file      Marcial Pacas, M.D. Ph.D.  [facility] [address], [address]  [place], [place] [address] Ph: [phone] Fax: [phone]  CC:  [name], Fransisca Kaufmann, MD [place],   [id number]  [name], Fransisca Kaufmann, MD

## 2020-09-13 ENCOUNTER — Other Ambulatory Visit: Payer: Self-pay | Admitting: Family Medicine

## 2020-09-13 DIAGNOSIS — M940 Chondrocostal junction syndrome [Tietze]: Secondary | ICD-10-CM

## 2020-09-23 ENCOUNTER — Other Ambulatory Visit: Payer: Self-pay

## 2020-09-23 ENCOUNTER — Ambulatory Visit (INDEPENDENT_AMBULATORY_CARE_PROVIDER_SITE_OTHER): Payer: Commercial Managed Care - PPO | Admitting: Family Medicine

## 2020-09-23 ENCOUNTER — Encounter: Payer: Self-pay | Admitting: Family Medicine

## 2020-09-23 VITALS — BP 105/69 | HR 81 | Temp 96.7°F | Ht 67.0 in | Wt 210.4 lb

## 2020-09-23 DIAGNOSIS — R42 Dizziness and giddiness: Secondary | ICD-10-CM

## 2020-09-23 DIAGNOSIS — F411 Generalized anxiety disorder: Secondary | ICD-10-CM

## 2020-09-23 DIAGNOSIS — F410 Panic disorder [episodic paroxysmal anxiety] without agoraphobia: Secondary | ICD-10-CM | POA: Diagnosis not present

## 2020-09-23 DIAGNOSIS — F319 Bipolar disorder, unspecified: Secondary | ICD-10-CM | POA: Diagnosis not present

## 2020-09-23 MED ORDER — BUSPIRONE HCL 7.5 MG PO TABS: 15.0000 mg | ORAL_TABLET | Freq: Two times a day (BID) | ORAL | 0 refills | Status: DC

## 2020-09-23 NOTE — Progress Notes (Signed)
Acute Office Visit  Subjective:    Patient ID: Pam Chen, female    DOB: 1971/03/07, 50 y.o.   MRN: [id number]  Chief Complaint  Patient presents with  . Dizziness    HPI Patient is in today for an episode of dizziness and lightheadedness that occurred while at work on Monday. She felt like she was going to pass out. She denies LOC. She was evaluated by EMS. Her BP was initially high- 170s/120s but returned to normal after calming down. She has been having episodes where she feels anxious, dizzy, light headed, and shaky with chest tightness. When she calms down, her symptoms resolve. She has a history of panic attacks and this feels the same. This has been happening daily since Monday. She currently take abilify 30 mg and fluoxetine 60 mg. She is established with psychiatry but has not seen them recently. She denies chest pain or edema. She has tried vistaril before without improvement. She has tried buspar before and felt it was a little helpful.   Depression screen [facility] 2/9 09/23/2020 08/20/2020 07/23/2020  Decreased Interest 0 0 0  Down, Depressed, Hopeless 1 0 0  PHQ - 2 Score 1 0 0  Altered sleeping 1 - -  Tired, decreased energy 2 - -  Change in appetite 2 - -  Feeling bad or failure about yourself  0 - -  Trouble concentrating 0 - -  Moving slowly or fidgety/restless 0 - -  Suicidal thoughts 0 - -  PHQ-9 Score 6 - -  Difficult doing work/chores Extremely dIfficult - -  Some recent data might be hidden   GAD 7 : Generalized Anxiety Score 09/23/2020 05/13/2020 11/25/2019 08/13/2019  Nervous, Anxious, on Edge '2 2 3 2  ' Control/stop worrying '3 2 3 1  ' Worry too much - different things '3 2 3 2  ' Trouble relaxing '3 1 2 1  ' Restless '1 1 1 ' 0  Easily annoyed or irritable '1 2 3 1  ' Afraid - awful might happen '2 2 3 ' 0  Total GAD 7 Score '15 12 18 7  ' Anxiety Difficulty - - Extremely difficult -     Past Medical History:  Diagnosis Date  . Arthritis   . Current every day smoker  03/07/2017  . Diverticulitis   . GERD (gastroesophageal reflux disease)   . 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 History of adenomatous polyp of colon 05/02/2016   Adenoma 05/02/16; repeat 04/2021; [name], MD [facility])  . History of bipolar disorder 08/25/2019  . History of partial colectomy 03/28/2016   diverticulitis  . Hypertension   . Hypothyroid 02/02/2017  . Kidney stones   . Migraine   . Obesity (BMI 30.0-34.9) 02/02/2017  . Polysubstance dependence including opioid type drug with complication, episodic abuse ([place]) 08/25/2019  . Rotator cuff injury   . Sleep apnea    CPAP  . Tennis elbow   . Thyroid disease     Past Surgical History:  Procedure Laterality Date  . ABDOMINAL HYSTERECTOMY    . COLON SURGERY     diverticulitis  . LITHOTRIPSY    . SLEEVE GASTROPLASTY      Family History  Problem Relation Age of Onset  . Healthy Mother   . Hypertension Father   . Sleep apnea Father   . Cancer Maternal Grandmother        breast  . Alzheimer's disease Maternal Grandmother   . Diabetes Maternal  Grandfather   . Cancer Paternal Grandmother        ovarian  . Hypertension Paternal Grandfather   . Heart disease Paternal Grandfather   . Diabetes Paternal Grandfather   . Hyperlipidemia Brother     Social History   Socioeconomic History  . Marital status: Single    Spouse name: Not on file  . Number of children: 3  . Years of education: GED  . Highest education level: Not on file  Occupational History  . Occupation: drives fork lift/loads trucks in [employer]  Tobacco Use  . Smoking status: Current Every Day Smoker    Packs/day: 1.00    Types: Cigarettes  . Smokeless tobacco: Never Used  Vaping Use  . Vaping Use: Former  Substance and Sexual Activity  . Alcohol use: No  . Drug use: Not Currently    Types: Methamphetamines  . Sexual activity: Not on file  Other Topics Concern  . Not on file  Social  History Narrative   Lives at home with fiance and son.   Right-handed.   2 cups coffee, 1 glass of sweet tea per day.   Social Determinants of Health   Financial Resource Strain: Not on file  Food Insecurity: Not on file  Transportation Needs: Not on file  Physical Activity: Not on file  Stress: Not on file  Social Connections: Not on file  Intimate Partner Violence: Not on file    Outpatient Medications Prior to Visit  Medication Sig Dispense Refill  . albuterol (VENTOLIN HFA) 108 (90 Base) MCG/ACT inhaler TAKE 1-2 PUFFS AS NEEDED FOR WHEEZING/CHEST TIGHTNESS 18 g 1  . ARIPiprazole (ABILIFY) 30 MG tablet Take 30 mg by mouth daily.    Marland Kitchen FLUoxetine HCl 60 MG TABS TAKE 1 TABLET BY MOUTH EVERY DAY 30 tablet 11  . Fluticasone-Salmeterol (ADVAIR) 250-50 MCG/DOSE AEPB Inhale 1 puff into the lungs every 12 (twelve) hours. 60 each 2  . Galcanezumab-gnlm (EMGALITY) 120 MG/ML SOAJ Inject 120 mg into the skin every 30 (thirty) days. 3 mL 4  . ibuprofen (ADVIL) 800 MG tablet TAKE 1 TABLET BY MOUTH EVERY 8 HOURS AS NEEDED 60 tablet 1  . linaclotide (LINZESS) 290 MCG CAPS capsule Take 1 capsule (290 mcg total) by mouth daily before breakfast. 90 capsule 1  . mirabegron ER (MYRBETRIQ) 25 MG TB24 tablet Take 1 tablet (25 mg total) by mouth daily. 30 tablet 5  . Multiple Vitamin (MULTIVITAMIN WITH MINERALS) TABS tablet Take 1 tablet by mouth daily.    . ondansetron (ZOFRAN-ODT) 4 MG disintegrating tablet Take 1 tablet (4 mg total) by mouth every 8 (eight) hours as needed. 20 tablet 11  . propranolol ER (INDERAL LA) 60 MG 24 hr capsule Take 1 capsule (60 mg total) by mouth daily. 30 capsule 11  . rizatriptan (MAXALT-MLT) 10 MG disintegrating tablet Take 1 tablet (10 mg total) by mouth as needed. May repeat in 2 hours if needed 15 tablet 6  . topiramate (TOPAMAX) 50 MG tablet Take 2 tablets (100 mg total) by mouth at bedtime. 60 tablet 11  . ARIPiprazole (ABILIFY) 20 MG tablet Take 30 mg by mouth.    .  Cholecalciferol 100 MCG (4000 UT) CAPS Take 4,000 Units by mouth daily. (Patient not taking: Reported on 09/23/2020)     No facility-administered medications prior to visit.    Allergies  Allergen Reactions  . Aspirin Other (See Comments)    Upset tummy.  Can take ibuprofen.  . Oxycodone-Acetaminophen Anxiety  Can take vicodin  . Tramadol Hcl Nausea Only    Review of Systems As per HPI.     Objective:    Physical Exam Vitals and nursing note reviewed.  Constitutional:      General: She is not in acute distress.    Appearance: She is not ill-appearing, toxic-appearing or diaphoretic.  Eyes:     Pupils: Pupils are equal, round, and reactive to light.  Cardiovascular:     Rate and Rhythm: Normal rate and regular rhythm.     Heart sounds: Normal heart sounds. No murmur heard.   Pulmonary:     Effort: Pulmonary effort is normal. No respiratory distress.     Breath sounds: Normal breath sounds. No wheezing, rhonchi or rales.  Chest:     Chest wall: No tenderness.  Musculoskeletal:     Right lower leg: No edema.     Left lower leg: No edema.  Skin:    General: Skin is warm and dry.  Neurological:     General: No focal deficit present.     Mental Status: She is alert and oriented to person, place, and time.  Psychiatric:        Attention and Perception: Attention normal.        Mood and Affect: Mood is anxious.        Speech: Speech normal.        Behavior: Behavior normal.     BP 105/69   Pulse 81   Temp (!) 96.7 F (35.9 C) (Temporal)   Ht '5\' 7"'  (1.702 m)   Wt 210 lb 6 oz (95.4 kg)   BMI 32.95 kg/m  Wt Readings from Last 3 Encounters:  09/23/20 210 lb 6 oz (95.4 kg)  08/31/20 219 lb 8 oz (99.6 kg)  08/20/20 215 lb 9.6 oz (97.8 kg)    There are no preventive care reminders to display for this patient.  There are no preventive care reminders to display for this patient.   Lab Results  Component Value Date   TSH 1.890 07/23/2020   Lab Results   Component Value Date   WBC 7.0 05/13/2020   HGB 13.8 05/13/2020   HCT 40.3 05/13/2020   MCV 89 05/13/2020   PLT 218 05/13/2020   Lab Results  Component Value Date   NA 139 05/13/2020   K 4.3 05/13/2020   CO2 27 05/13/2020   GLUCOSE 99 05/13/2020   BUN 11 05/13/2020   CREATININE 0.84 05/13/2020   BILITOT <0.2 05/13/2020   ALKPHOS 69 05/13/2020   AST 16 05/13/2020   ALT 14 05/13/2020   PROT 6.2 05/13/2020   ALBUMIN 4.0 05/13/2020   CALCIUM 9.1 05/13/2020   ANIONGAP 9 01/22/2019   Lab Results  Component Value Date   CHOL 199 05/13/2020   Lab Results  Component Value Date   HDL 44 05/13/2020   Lab Results  Component Value Date   LDLCALC 130 (H) 05/13/2020   Lab Results  Component Value Date   TRIG 137 05/13/2020   Lab Results  Component Value Date   CHOLHDL 4.5 (H) 05/13/2020   No results found for: HGBA1C     Assessment & Plan:   Pam Chen was seen today for dizziness.  Diagnoses and all orders for this visit:  Dizziness Discussed that episodes of dizziness are consistent with panic attacks the they occur along with increased anxiety and resolved after calming down. Will check labs as below. Exam unremarkable today.  -  CBC with Differential/Platelet -     CMP14+EGFR -     TSH  Panic attack Add buspar BID as below. Patient will follow up with psychiatry.  -     busPIRone (BUSPAR) 7.5 MG tablet; Take 2 tablets (15 mg total) by mouth 2 (two) times daily.  GAD (generalized anxiety disorder) Uncontrolled. Add buspar. Follow up with psychiatry.  -     busPIRone (BUSPAR) 7.5 MG tablet; Take 2 tablets (15 mg total) by mouth 2 (two) times daily.  Bipolar 1 disorder, depressed (HCC) PHQ score of 6 today. No SI. Add buspar. Follow up with psych.  -     busPIRone (BUSPAR) 7.5 MG tablet; Take 2 tablets (15 mg total) by mouth 2 (two) times daily.  Return to office for new or worsening symptoms, or if symptoms persist.   The patient indicates understanding of  these issues and agrees with the plan.  Gwenlyn Perking, FNP

## 2020-09-23 NOTE — Patient Instructions (Signed)
https://www.nimh.nih.gov/health/topics/anxiety-disorders/index.shtml">  Panic Attack A panic attack is a sudden episode of severe anxiety, fear, or discomfort that causes physical and emotional symptoms. The attack may be in response to something frightening, or it may occur for no known reason. Symptoms of a panic attack can be similar to symptoms of a heart attack or stroke. It is important to see your health care provider when you have a panic attack so that these conditions can be ruled out. A panic attack is a symptom of another condition. Most panic attacks go away with treatment of the underlying problem. If you have panic attacks often, you may have a condition called panic disorder. What are the causes? A panic attack may be caused by:  An extreme, life-threatening situation, such as a war or natural disaster.  An anxiety disorder, such as post-traumatic stress disorder.  Depression.  Certain medical conditions, including heart problems, neurological conditions, and infections.  Certain over-the-counter and prescription medicines.  Illegal drugs that increase heart rate and blood pressure, such as methamphetamine.  Alcohol.  Supplements that increase anxiety.  Panic disorder. What increases the risk? You are more likely to develop this condition if:  You have an anxiety disorder.  You have another mental health condition.  You take certain medicines.  You use alcohol, illegal drugs, or other substances.  You are under extreme stress.  A life event is causing increased feelings of anxiety and depression. What are the signs or symptoms? A panic attack starts suddenly, usually lasts about 20 minutes, and occurs with one or more of the following:  A pounding heart.  A feeling that your heart is beating irregularly or faster than normal (palpitations).  Sweating.  Trembling or shaking.  Shortness of breath or feeling smothered.  Feeling choked.  Chest pain or  discomfort.  Nausea or a strange feeling in your stomach.  Dizziness, feeling lightheaded, or feeling like you might faint.  Chills or hot flashes.  Numbness or tingling in your lips, hands, or feet.  Feeling confused, or feeling that you are not yourself.  Fear of losing control or being emotionally unstable.  Fear of dying. How is this diagnosed? A panic attack is diagnosed with an assessment by your health care provider. During the assessment your health care provider will ask questions about:  Your history of anxiety, depression, and panic attacks.  Your medical history.  Whether you drink alcohol, use illegal drugs, take supplements, or take medicines. Be honest about your substance use. Your health care provider may also:  Order blood tests or other kinds of tests to rule out serious medical conditions.  Refer you to a mental health professional for further evaluation.   How is this treated? Treatment depends on the cause of the panic attack:  If the cause is a medical problem, your health care provider will either treat that problem or refer you to a specialist.  If the cause is emotional, you may be given anti-anxiety medicines or referred to a counselor. These medicines may reduce how often attacks happen, reduce how severe the attacks are, and lower anxiety.  If the cause is a medicine, your health care provider may tell you to stop the medicine, change your dose, or take a different medicine.  If the cause is a drug, treatment may involve letting the drug wear off and taking medicine to help the drug leave your body or to counteract its effects. Attacks caused by drug abuse may continue even if you stop using the   drug. Follow these instructions at home:  Take over-the-counter and prescription medicines only as told by your health care provider.  If you feel anxious, limit your caffeine intake.  Take good care of your physical and mental health by: ? Eating a  balanced diet that includes plenty of fresh fruits and vegetables, whole grains, lean meats, and low-fat dairy. ? Getting plenty of rest. Try to get 7-8 hours of uninterrupted sleep each night. ? Exercising regularly. Try to get 30 minutes of physical activity at least 5 days a week. ? Not smoking. Talk to your health care provider if you need help quitting. ? Limiting alcohol intake to no more than 1 drink a day for nonpregnant women and 2 drinks a day for men. One drink equals 12 oz of beer, 5 oz of wine, or 1 oz of hard liquor.  Keep all follow-up visits as told by your health care provider. This is important. Panic attacks may have underlying physical or emotional problems that take time to accurately diagnose. Contact a health care provider if:  Your symptoms do not improve, or they get worse.  You are not able to take your medicine as prescribed because of side effects. Get help right away if:  You have serious thoughts about hurting yourself or others.  You have symptoms of a panic attack. Do not drive yourself to the hospital. Have someone else drive you or call an ambulance. If you ever feel like you may hurt yourself or others, or you have thoughts about taking your own life, get help right away. You can go to your nearest emergency department or call:  Your local emergency services (911 in the [place]  A suicide crisis helpline, such as the National Suicide Prevention Lifeline at 1-800-273-8255. This is open 24 hours a day. Summary  A panic attack is a sign of a serious health or mental health condition. Get help right away. Do not drive yourself to the hospital. Have someone else drive you or call an ambulance.  Always see a health care provider to have the reasons for the panic attack correctly diagnosed.  If your panic attack was caused by a physical problem, follow your health care provider's suggestions for medicine, referral to a specialist, and lifestyle changes.  If  your panic attack was caused by an emotional problem, follow through with counseling from a qualified mental health specialist.  If you feel like you may hurt yourself or others, call 911 and get help right away. This information is not intended to replace advice given to you by your health care provider. Make sure you discuss any questions you have with your health care provider. Document Revised: 11/13/2019 Document Reviewed: 11/13/2019 Elsevier Patient Education  2021 Elsevier Inc.  

## 2020-09-24 DIAGNOSIS — Z029 Encounter for administrative examinations, unspecified: Secondary | ICD-10-CM

## 2020-09-24 LAB — CBC WITH DIFFERENTIAL/PLATELET
Basophils Absolute: 0.1 10*3/uL (ref 0.0–0.2)
Basos: 1 %
EOS (ABSOLUTE): 0.1 10*3/uL (ref 0.0–0.4)
Eos: 1 %
Hematocrit: 43.5 % (ref 34.0–46.6)
Hemoglobin: 15 g/dL (ref 11.1–15.9)
Immature Grans (Abs): 0.1 10*3/uL (ref 0.0–0.1)
Immature Granulocytes: 1 %
Lymphocytes Absolute: 2 10*3/uL (ref 0.7–3.1)
Lymphs: 21 %
MCH: 30.1 pg (ref 26.6–33.0)
MCHC: 34.5 g/dL (ref 31.5–35.7)
MCV: 87 fL (ref 79–97)
Monocytes Absolute: 0.9 10*3/uL (ref 0.1–0.9)
Monocytes: 9 %
Neutrophils Absolute: 6.2 10*3/uL (ref 1.4–7.0)
Neutrophils: 67 %
Platelets: 271 10*3/uL (ref 150–450)
RBC: 4.98 x10E6/uL (ref 3.77–5.28)
RDW: 13.4 % (ref 11.7–15.4)
WBC: 9.3 10*3/uL (ref 3.4–10.8)

## 2020-09-24 LAB — CMP14+EGFR
ALT: 15 IU/L (ref 0–32)
AST: 20 IU/L (ref 0–40)
Albumin/Globulin Ratio: 1.4 (ref 1.2–2.2)
Albumin: 3.9 g/dL (ref 3.8–4.8)
Alkaline Phosphatase: 85 IU/L (ref 44–121)
BUN/Creatinine Ratio: 10 (ref 9–23)
BUN: 11 mg/dL (ref 6–24)
Bilirubin Total: 0.7 mg/dL (ref 0.0–1.2)
CO2: 19 mmol/L — ABNORMAL LOW (ref 20–29)
Calcium: 9.6 mg/dL (ref 8.7–10.2)
Chloride: 101 mmol/L (ref 96–106)
Creatinine, Ser: 1.08 mg/dL — ABNORMAL HIGH (ref 0.57–1.00)
Globulin, Total: 2.7 g/dL (ref 1.5–4.5)
Glucose: 83 mg/dL (ref 65–99)
Potassium: 3.8 mmol/L (ref 3.5–5.2)
Sodium: 136 mmol/L (ref 134–144)
Total Protein: 6.6 g/dL (ref 6.0–8.5)
eGFR: 64 mL/min/{1.73_m2} (ref >59)

## 2020-09-24 LAB — TSH: TSH: 1.63 u[IU]/mL (ref 0.450–4.500)

## 2020-09-28 ENCOUNTER — Other Ambulatory Visit: Payer: Self-pay | Admitting: Family Medicine

## 2020-09-28 DIAGNOSIS — Z1231 Encounter for screening mammogram for malignant neoplasm of breast: Secondary | ICD-10-CM

## 2020-09-29 ENCOUNTER — Ambulatory Visit: Payer: Commercial Managed Care - PPO | Admitting: Family Medicine

## 2020-09-30 ENCOUNTER — Other Ambulatory Visit: Payer: Self-pay | Admitting: Family Medicine

## 2020-09-30 DIAGNOSIS — F411 Generalized anxiety disorder: Secondary | ICD-10-CM

## 2020-09-30 DIAGNOSIS — F410 Panic disorder [episodic paroxysmal anxiety] without agoraphobia: Secondary | ICD-10-CM

## 2020-09-30 DIAGNOSIS — F319 Bipolar disorder, unspecified: Secondary | ICD-10-CM

## 2020-10-28 ENCOUNTER — Other Ambulatory Visit: Payer: Self-pay

## 2020-10-28 ENCOUNTER — Emergency Department (HOSPITAL_COMMUNITY)
Admission: EM | Admit: 2020-10-28 | Discharge: 2020-10-28 | Disposition: A | Discharge status: Home / Self Care | Payer: Commercial Managed Care - PPO | Attending: Emergency Medicine | Admitting: Emergency Medicine

## 2020-10-28 ENCOUNTER — Encounter (HOSPITAL_COMMUNITY): Payer: Self-pay

## 2020-10-28 DIAGNOSIS — E039 Hypothyroidism, unspecified: Secondary | ICD-10-CM | POA: Diagnosis not present

## 2020-10-28 DIAGNOSIS — G43009 Migraine without aura, not intractable, without status migrainosus: Secondary | ICD-10-CM | POA: Insufficient documentation

## 2020-10-28 DIAGNOSIS — R519 Headache, unspecified: Secondary | ICD-10-CM | POA: Diagnosis present

## 2020-10-28 DIAGNOSIS — F17210 Nicotine dependence, cigarettes, uncomplicated: Secondary | ICD-10-CM | POA: Diagnosis not present

## 2020-10-28 DIAGNOSIS — I10 Essential (primary) hypertension: Secondary | ICD-10-CM | POA: Insufficient documentation

## 2020-10-28 MED ORDER — KETOROLAC TROMETHAMINE 30 MG/ML IJ SOLN
30.0000 mg | Freq: Once | INTRAMUSCULAR | Status: AC
  Administered 2020-10-28: 30 mg via INTRAMUSCULAR
  Filled 2020-10-28: qty 1

## 2020-10-28 MED ORDER — ONDANSETRON 4 MG PO TBDP
4.0000 mg | ORAL_TABLET | Freq: Once | ORAL | Status: AC
  Administered 2020-10-28: 4 mg via ORAL
  Filled 2020-10-28: qty 1

## 2020-10-28 MED ORDER — SUMATRIPTAN SUCCINATE 6 MG/0.5ML ~~LOC~~ SOLN
6.0000 mg | Freq: Once | SUBCUTANEOUS | Status: AC
  Administered 2020-10-28: 6 mg via SUBCUTANEOUS
  Filled 2020-10-28: qty 0.5

## 2020-10-28 NOTE — ED Provider Notes (Signed)
Emergency Department Provider Note   I have reviewed the triage vital signs and the nursing notes.   HISTORY  Chief Complaint Migraine   HPI Pam Chen is a 50 y.o. female with past medical history reviewed below including history of migraine, followed by neurology in [place], presents to the emergency department with migraine type headache since yesterday.  Patient has a throbbing headache not responding to home medications and rest.    She is not experiencing any fevers.  No sudden onset maximal intensity headache symptoms.  She has noticed some blurring in the left eye which is typical for her migraine.  She has no unilateral numbness/weakness.  She is not experiencing any radiation of symptoms or other modifying factors.  Past Medical History:  Diagnosis Date  . Arthritis   . Current every day smoker 03/07/2017  . Diverticulitis   . GERD (gastroesophageal reflux disease)   . 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 History of adenomatous polyp of colon 05/02/2016   Adenoma 05/02/16; repeat 04/2021; Barbaraann Share, MD [facility])  . History of bipolar disorder 08/25/2019  . History of partial colectomy 03/28/2016   diverticulitis  . Hypertension   . Hypothyroid 02/02/2017  . Kidney stones   . Migraine   . Obesity (BMI 30.0-34.9) 02/02/2017  . Polysubstance dependence including opioid type drug with complication, episodic abuse (HCC) 08/25/2019  . Rotator cuff injury   . Sleep apnea    CPAP  . Tennis elbow   . Thyroid disease     Patient Active Problem List   Diagnosis Date Noted  . Chronic migraine with aura 08/31/2020  . Constipation 08/20/2020  . Hypothyroidism 07/23/2020  . Polysubstance dependence including opioid type drug with complication, episodic abuse (HCC) 08/25/2019  . History of bipolar disorder 08/25/2019  . Insomnia 08/18/2019  . 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 Herpes simplex type 2 infection  02/13/2019  . Hyperlipidemia LDL goal <130 05/01/2017  . Current every day smoker 03/07/2017  . Obesity (BMI 30.0-34.9) 02/02/2017  . Insomnia disorder 02/02/2017  . GERD (gastroesophageal reflux disease) 02/02/2017  . Migraine 02/02/2017  . Obstructive sleep apnea syndrome 05/24/2016  . History of adenomatous polyp of colon 05/02/2016  . History of partial colectomy 03/28/2016  . Bipolar 1 disorder, depressed (HCC) 01/09/2016    Past Surgical History:  Procedure Laterality Date  . ABDOMINAL HYSTERECTOMY    . COLON SURGERY     diverticulitis  . LITHOTRIPSY    . SLEEVE GASTROPLASTY      Allergies Aspirin, Oxycodone-acetaminophen, and Tramadol hcl  Family History  Problem Relation Age of Onset  . Healthy Mother   . Hypertension Father   . Sleep apnea Father   . Cancer Maternal Grandmother        breast  . Alzheimer's disease Maternal Grandmother   . Diabetes Maternal Grandfather   . Cancer Paternal Grandmother        ovarian  . Hypertension Paternal Grandfather   . Heart disease Paternal Grandfather   . Diabetes Paternal Grandfather   . Hyperlipidemia Brother     Social History Social History   Tobacco Use  . Smoking status: Current Every Day Smoker    Packs/day: 1.00    Types: Cigarettes  . Smokeless tobacco: Never Used  Vaping Use  . Vaping Use: Former  Substance Use Topics  . Alcohol use: No  . Drug use: Not Currently    Types:  Methamphetamines    Review of Systems  Constitutional: No fever/chills Eyes: Positive blurry vision left eye.  ENT: No sore throat. Cardiovascular: Denies chest pain. Respiratory: Denies shortness of breath. Gastrointestinal: No abdominal pain.  No nausea, no vomiting.  No diarrhea.  No constipation. Genitourinary: Negative for dysuria. Musculoskeletal: Negative for back pain. Skin: Negative for rash. Neurological: Negative for focal weakness or numbness. Positive migraine type HA.   10-point ROS otherwise  negative.  ____________________________________________   PHYSICAL EXAM:  VITAL SIGNS: ED Triage Vitals  Enc Vitals Group     BP 10/28/20 0821 (!) 148/90     Pulse Rate 10/28/20 0821 74     Resp 10/28/20 0821 18     Temp 10/28/20 0821 98.2 F (36.8 C)     Temp Source 10/28/20 0821 Oral     SpO2 10/28/20 0821 96 %     Weight 10/28/20 0820 206 lb (93.4 kg)     Height 10/28/20 0820 5' 7.5" (1.715 m)   Constitutional: Alert and oriented. Well appearing and in no acute distress. Eyes: Conjunctivae are normal. PERRL. Positive photophobia.  Head: Atraumatic. Nose: No congestion/rhinnorhea. Mouth/Throat: Mucous membranes are moist.  Neck: No stridor.   Cardiovascular: Normal rate, regular rhythm. Good peripheral circulation. Grossly normal heart sounds.   Respiratory: Normal respiratory effort.  No retractions. Lungs CTAB. Gastrointestinal: Soft and nontender. No distention.  Musculoskeletal:No gross deformities of extremities. Neurologic:  Normal speech and language. No gross focal neurologic deficits are appreciated. No facial asymmetry. 5/5 strength in the bilateral upper/lower extremities.  Skin:  Skin is warm, dry and intact. No rash noted.  ____________________________________________  RADIOLOGY  None  ____________________________________________   PROCEDURES  Procedure(s) performed:   Procedures  None  ____________________________________________   INITIAL IMPRESSION / ASSESSMENT AND PLAN / ED COURSE  Pertinent labs & imaging results that were available during my care of the patient were reviewed by me and considered in my medical decision making (see chart for details).   Patient presents to the emergency department with typical migraine headache not responding to home medications.  She has no focal neurologic deficits.  She is not complaining of some blurry vision in the left eye which she states is typical for her migraines.  I do not find evidence to suspect  infection, acute hemorrhage, stroke, etc. Plan for ED migraine mgmt and reassess. Patient is established with a Neurologist in [place].   09:27 AM  Patient feeling improved.  She is up and ambulatory.  She is requesting discharge.  She will follow with her PCP and neurology teams as scheduled.  ____________________________________________  FINAL CLINICAL IMPRESSION(S) / ED DIAGNOSES  Final diagnoses:  Migraine without aura and without status migrainosus, not intractable     MEDICATIONS GIVEN DURING THIS VISIT:  Medications  ketorolac (TORADOL) 30 MG/ML injection 30 mg (30 mg Intramuscular Given 10/28/20 0848)  SUMAtriptan (IMITREX) injection 6 mg (6 mg Subcutaneous Given 10/28/20 0846)  ondansetron (ZOFRAN-ODT) disintegrating tablet 4 mg (4 mg Oral Given 10/28/20 0845)    Note:  This document was prepared using Dragon voice recognition software and may include unintentional dictation errors.  Alona Bene, MD, [facility] Emergency Medicine    Merek Niu, Arlyss Repress, MD 10/28/20 [phone]

## 2020-10-28 NOTE — Discharge Instructions (Signed)

## 2020-10-28 NOTE — ED Triage Notes (Signed)
Pt presents to ED with complaints of migraine started yesterday am, blurred vision in left eye. Pt with hx of migraines

## 2020-11-08 ENCOUNTER — Encounter (HOSPITAL_COMMUNITY): Payer: Self-pay | Admitting: Medical

## 2020-11-08 NOTE — Progress Notes (Signed)
Patient ID: Pam Chen, female   DOB: Feb 02, 1971, 50 y.o.   MRN: [id number]  Patient failed to attend last 3 consecutive groups without calling/excuse. When Counselor was finally able tp speak with her she said she wasnt motivated to stop smoking pot/try abstinence.She withdrew herself from recommended treatment.

## 2020-11-12 ENCOUNTER — Other Ambulatory Visit: Payer: Self-pay | Admitting: Family Medicine

## 2020-11-12 DIAGNOSIS — M940 Chondrocostal junction syndrome [Tietze]: Secondary | ICD-10-CM

## 2020-11-24 ENCOUNTER — Ambulatory Visit
Admission: RE | Admit: 2020-11-24 | Discharge: 2020-11-24 | Disposition: A | Discharge status: Home / Self Care | Payer: Medicaid Other | Source: Ambulatory Visit | Attending: Family Medicine | Admitting: Family Medicine

## 2020-11-24 ENCOUNTER — Other Ambulatory Visit: Payer: Self-pay

## 2020-11-24 DIAGNOSIS — Z1231 Encounter for screening mammogram for malignant neoplasm of breast: Secondary | ICD-10-CM

## 2020-12-01 ENCOUNTER — Encounter: Payer: Self-pay | Admitting: Neurology

## 2020-12-01 ENCOUNTER — Ambulatory Visit: Payer: Commercial Managed Care - PPO | Admitting: Neurology

## 2020-12-01 NOTE — Progress Notes (Deleted)
PATIENT: Pam Chen DOB: 02/15/1971  REASON FOR VISIT: follow up HISTORY FROM: patient Primary Neurologist: Dr. Terrace ArabiaYan   HISTORY  Pam Feliz Beamravis is a 50 year old female, seen in request by her primary care physician Dr.  Louanne Skyeettinger, Elige RadonJoshua A, for continued care of her migraine, I saw her previously in March 2019   I reviewed and summarized the referring note.PMHx. Asthma Smoke 1/2 ppd Depression, anxiety, Chronic migraine Urinary frequency, urgency. History of hypothyroidism, was on supplement, but not anymore   She reported a history of migraine headaches since teenager, gradually getting worse, her typical migraines are left lateralized severe pounding headache with associated light noise sensitivity, nauseous, lasting for a few hours, responding to Maxalt, but she also has rebound headache few hours later.   Her headache gradually gets worse over the years, have 2-3 typical migraine headaches in a week, mild to moderate ones in between, 1 month, she would use up all 9 tablets of Maxalt, and frequent over-the-counter Excedrin Migraine, double strength Tylenol use 4-5 times each week,   She also has to use Maxalt twice in the day for rebound headaches few hours later,   In March 2019, she had to severe prolonged migraine headache, lasting for 2 weeks, has to go to emergency room on March 1, and March 8, eventually improved with emergency room IV treatment, Decadron 10 mg, Haldol 2.5 mg, Toradol 30 mg, magnesium 2 g once,   She was able to tolerate Topamax 50 mg 2 tablets every night previously for migraine prevention, which helped her some, was recently switched to Inderal LA 120 mg every night, which also provides some help.   She also has history of obstructive sleep apnea, using CPAP machine, she had 130 pound weight loss over the past    She was started on Topamax 100 mg twice a day which was helpful, but she stopped taking all her medication in 2020, since then complains worsening  headache, also mood disorder   She was restarted on propanolol 10 mg 3 times a day, also on Emgality 120 mg every months, despite all those preventive medications, she complains of daily headaches, mild to moderate, couple times a  week, it would exacerbate to more severe right lateralized severe pounding headache with light noise sensitivity, she has been taking frequent ibuprofen at least 3-4 times each week,   Maxalt as needed works well most of the time  Update December 01, 2020 SS:   REVIEW OF SYSTEMS: Out of a complete 14 system review of symptoms, the patient complains only of the following symptoms, and all other reviewed systems are negative.  ALLERGIES: Allergies  Allergen Reactions   Aspirin Other (See Comments)    Upset tummy.  Can take ibuprofen.   Oxycodone-Acetaminophen Anxiety    Can take vicodin   Tramadol Hcl Nausea Only    HOME MEDICATIONS: Outpatient Medications Prior to Visit  Medication Sig Dispense Refill   albuterol (VENTOLIN HFA) 108 (90 Base) MCG/ACT inhaler TAKE 1-2 PUFFS AS NEEDED FOR WHEEZING/CHEST TIGHTNESS 18 g 1   ARIPiprazole (ABILIFY) 30 MG tablet Take 30 mg by mouth daily.     busPIRone (BUSPAR) 7.5 MG tablet TAKE 2 TABLETS (15 MG TOTAL) BY MOUTH 2 (TWO) TIMES DAILY. (Patient not taking: Reported on 10/28/2020) 360 tablet 0   FLUoxetine HCl 60 MG TABS TAKE 1 TABLET BY MOUTH EVERY DAY 30 tablet 11   Fluticasone-Salmeterol (ADVAIR) 250-50 MCG/DOSE AEPB Inhale 1 puff into the lungs every 12 (twelve) hours. 60  each 2   Galcanezumab-gnlm (EMGALITY) 120 MG/ML SOAJ Inject 120 mg into the skin every 30 (thirty) days. 3 mL 4   ibuprofen (ADVIL) 800 MG tablet TAKE 1 TABLET BY MOUTH EVERY 8 HOURS AS NEEDED 60 tablet 1   linaclotide (LINZESS) 290 MCG CAPS capsule Take 1 capsule (290 mcg total) by mouth daily before breakfast. 90 capsule 1   mirabegron ER (MYRBETRIQ) 25 MG TB24 tablet Take 1 tablet (25 mg total) by mouth daily. 30 tablet 5   Multiple Vitamin  (MULTIVITAMIN WITH MINERALS) TABS tablet Take 1 tablet by mouth daily.     ondansetron (ZOFRAN-ODT) 4 MG disintegrating tablet Take 1 tablet (4 mg total) by mouth every 8 (eight) hours as needed. (Patient not taking: Reported on 10/28/2020) 20 tablet 11   propranolol ER (INDERAL LA) 60 MG 24 hr capsule Take 1 capsule (60 mg total) by mouth daily. 30 capsule 11   rizatriptan (MAXALT-MLT) 10 MG disintegrating tablet Take 1 tablet (10 mg total) by mouth as needed. May repeat in 2 hours if needed 15 tablet 6   topiramate (TOPAMAX) 50 MG tablet Take 2 tablets (100 mg total) by mouth at bedtime. 60 tablet 11   No facility-administered medications prior to visit.    PAST MEDICAL HISTORY: Past Medical History:  Diagnosis Date   Arthritis    Current every day smoker 03/07/2017   Diverticulitis    GERD (gastroesophageal reflux disease)    Herpes simplex type 1 infection 02/13/2019   Herpes simplex type 2 infection 02/13/2019   History of adenomatous polyp of colon 05/02/2016   Adenoma 05/02/16; repeat 04/2021; Barbaraann Share, MD [facility])   History of bipolar disorder 08/25/2019   History of partial colectomy 03/28/2016   diverticulitis   Hypertension    Hypothyroid 02/02/2017   Kidney stones    Migraine    Obesity (BMI 30.0-34.9) 02/02/2017   Polysubstance dependence including opioid type drug with complication, episodic abuse (HCC) 08/25/2019   Rotator cuff injury    Sleep apnea    CPAP   Tennis elbow    Thyroid disease     PAST SURGICAL HISTORY: Past Surgical History:  Procedure Laterality Date   ABDOMINAL HYSTERECTOMY     COLON SURGERY     diverticulitis   LITHOTRIPSY     SLEEVE GASTROPLASTY      FAMILY HISTORY: Family History  Problem Relation Age of Onset   Healthy Mother    Hypertension Father    Sleep apnea Father    Breast cancer Maternal Grandmother    Cancer Maternal Grandmother        breast   Alzheimer's disease Maternal Grandmother    Diabetes Maternal Grandfather     Cancer Paternal Grandmother        ovarian   Hypertension Paternal Grandfather    Heart disease Paternal Grandfather    Diabetes Paternal Grandfather    Hyperlipidemia Brother     SOCIAL HISTORY: Social History   Socioeconomic History   Marital status: Single    Spouse name: Not on file   Number of children: 3   Years of education: GED   Highest education level: Not on file  Occupational History   Occupation: drives fork lift/loads trucks in [employer]  Tobacco Use   Smoking status: Every Day    Packs/day: 1.00    Pack years: 0.00    Types: Cigarettes   Smokeless tobacco: Never  Vaping Use   Vaping Use: Former  Substance and Sexual Activity  Alcohol use: No   Drug use: Not Currently    Types: Methamphetamines   Sexual activity: Not on file  Other Topics Concern   Not on file  Social History Narrative   Lives at home with fiance and son.   Right-handed.   2 cups coffee, 1 glass of sweet tea per day.   Social Determinants of Health   Financial Resource Strain: Not on file  Food Insecurity: Not on file  Transportation Needs: Not on file  Physical Activity: Not on file  Stress: Not on file  Social Connections: Not on file  Intimate Partner Violence: Not on file      PHYSICAL EXAM  There were no vitals filed for this visit. There is no height or weight on file to calculate BMI.  Generalized: Well developed, in no acute distress   Neurological examination  Mentation: Alert oriented to time, place, history taking. Follows all commands speech and language fluent Cranial nerve II-XII: Pupils were equal round reactive to light. Extraocular movements were full, visual field were full on confrontational test. Facial sensation and strength were normal. Uvula tongue midline. Head turning and shoulder shrug  were normal and symmetric. Motor: The motor testing reveals 5 over 5 strength of all 4 extremities. Good symmetric motor tone is noted throughout.  Sensory:  Sensory testing is intact to soft touch on all 4 extremities. No evidence of extinction is noted.  Coordination: Cerebellar testing reveals good finger-nose-finger and heel-to-shin bilaterally.  Gait and station: Gait is normal. Tandem gait is normal. Romberg is negative. No drift is seen.  Reflexes: Deep tendon reflexes are symmetric and normal bilaterally.   DIAGNOSTIC DATA (LABS, IMAGING, TESTING) - I reviewed patient records, labs, notes, testing and imaging myself where available.  Lab Results  Component Value Date   WBC 9.3 09/23/2020   HGB 15.0 09/23/2020   HCT 43.5 09/23/2020   MCV 87 09/23/2020   PLT 271 09/23/2020      Component Value Date/Time   NA 136 09/23/2020 1037   K 3.8 09/23/2020 1037   CL 101 09/23/2020 1037   CO2 19 (L) 09/23/2020 1037   GLUCOSE 83 09/23/2020 1037   GLUCOSE 86 01/22/2019 0247   BUN 11 09/23/2020 1037   CREATININE 1.08 (H) 09/23/2020 1037   CALCIUM 9.6 09/23/2020 1037   PROT 6.6 09/23/2020 1037   ALBUMIN 3.9 09/23/2020 1037   AST 20 09/23/2020 1037   ALT 15 09/23/2020 1037   ALKPHOS 85 09/23/2020 1037   BILITOT 0.7 09/23/2020 1037   GFRNONAA 83 05/13/2020 1549   GFRAA 96 05/13/2020 1549   Lab Results  Component Value Date   CHOL 199 05/13/2020   HDL 44 05/13/2020   LDLCALC 130 (H) 05/13/2020   TRIG 137 05/13/2020   CHOLHDL 4.5 (H) 05/13/2020   No results found for: HGBA1C No results found for: VITAMINB12 Lab Results  Component Value Date   TSH 1.630 09/23/2020      ASSESSMENT AND PLAN 50 y.o. year old female  has a past medical history of Arthritis, Current every day smoker (03/07/2017), Diverticulitis, GERD (gastroesophageal reflux disease), Herpes simplex type 1 infection (02/13/2019), Herpes simplex type 2 infection (02/13/2019), History of adenomatous polyp of colon (05/02/2016), History of bipolar disorder (08/25/2019), History of partial colectomy (03/28/2016), Hypertension, Hypothyroid (02/02/2017), Kidney stones, Migraine,  Obesity (BMI 30.0-34.9) (02/02/2017), Polysubstance dependence including opioid type drug with complication, episodic abuse (HCC) (08/25/2019), Rotator cuff injury, Sleep apnea, Tennis elbow, and Thyroid disease. here with:  1.  Chronic migraine with aura 2.  Depression, anxiety, polypharmacy treatment    I spent 15 minutes with the patient. 50% of this time was spent   Margie Ege, [place], DNP 12/01/2020, 5:30 AM [facility] [address] [place], [place] [address] [phone]

## 2020-12-06 ENCOUNTER — Other Ambulatory Visit: Payer: Self-pay | Admitting: Family Medicine

## 2020-12-06 DIAGNOSIS — K5900 Constipation, unspecified: Secondary | ICD-10-CM

## 2020-12-25 ENCOUNTER — Other Ambulatory Visit: Payer: Self-pay | Admitting: Family Medicine

## 2020-12-25 DIAGNOSIS — M940 Chondrocostal junction syndrome [Tietze]: Secondary | ICD-10-CM

## 2021-01-18 ENCOUNTER — Other Ambulatory Visit: Payer: Self-pay | Admitting: Nurse Practitioner

## 2021-01-18 ENCOUNTER — Encounter: Payer: Self-pay | Admitting: Nurse Practitioner

## 2021-01-18 ENCOUNTER — Ambulatory Visit (INDEPENDENT_AMBULATORY_CARE_PROVIDER_SITE_OTHER): Payer: Medicaid Other | Admitting: Nurse Practitioner

## 2021-01-18 ENCOUNTER — Telehealth: Payer: Self-pay | Admitting: Family Medicine

## 2021-01-18 DIAGNOSIS — R0981 Nasal congestion: Secondary | ICD-10-CM | POA: Diagnosis not present

## 2021-01-18 DIAGNOSIS — R059 Cough, unspecified: Secondary | ICD-10-CM | POA: Diagnosis not present

## 2021-01-18 MED ORDER — BENZONATATE 100 MG PO CAPS: 100.0000 mg | ORAL_CAPSULE | Freq: Three times a day (TID) | ORAL | 0 refills | Status: DC | PRN

## 2021-01-18 MED ORDER — AZITHROMYCIN 250 MG PO TABS: ORAL_TABLET | ORAL | 0 refills | Status: AC

## 2021-01-18 MED ORDER — DM-GUAIFENESIN ER 30-600 MG PO TB12: 1.0000 | ORAL_TABLET | Freq: Two times a day (BID) | ORAL | 0 refills | Status: DC

## 2021-01-18 MED ORDER — PREDNISONE 10 MG (21) PO TBPK: ORAL_TABLET | ORAL | 0 refills | Status: DC

## 2021-01-18 NOTE — Telephone Encounter (Signed)
Pt aware.

## 2021-01-18 NOTE — Progress Notes (Signed)
   Virtual Visit  Note Due to COVID-19 pandemic this visit was conducted virtually. This visit type was conducted due to national recommendations for restrictions regarding the COVID-19 Pandemic (e.g. social distancing, sheltering in place) in an effort to limit this patient's exposure and mitigate transmission in our community. All issues noted in this document were discussed and addressed.  A physical exam was not performed with this format.  I connected with Pam Chen on 01/18/21 at 9:30 AM by telephone and verified that I am speaking with the correct person using two identifiers. Pam Chen is currently located at home during visit. The provider, Daryll Drown, NP is located in their office at time of visit.  I discussed the limitations, risks, security and privacy concerns of performing an evaluation and management service by telephone and the availability of in person appointments. I also discussed with the patient that there may be a patient responsible charge related to this service. The patient expressed understanding and agreed to proceed.   History and Present Illness:  Cough This is a recurrent problem. The current episode started in the past 7 days. The problem has been gradually worsening. The problem occurs constantly. The cough is Productive of sputum. Associated symptoms include chills, headaches and nasal congestion. Pertinent negatives include no ear pain. Nothing aggravates the symptoms.    Concerning patient's congestion this is not new but associated with cough and is worse in the last 24 hours.   Review of Systems  Constitutional:  Positive for chills.  HENT:  Negative for ear pain.   Respiratory:  Positive for cough.   Cardiovascular: Negative.   Neurological:  Positive for headaches.  All other systems reviewed and are negative.   Observations/Objective: Televisit patient not in distress.  Assessment and Plan: Increase hydration, benzonatate for cough,  prednisone taper, COVID-19 test completed results pending.  Tylenol/ibuprofen for pain or fever.  Follow Up Instructions: Follow-up worsening unresolved symptoms.    I discussed the assessment and treatment plan with the patient. The patient was provided an opportunity to ask questions and all were answered. The patient agreed with the plan and demonstrated an understanding of the instructions.   The patient was advised to call back or seek an in-person evaluation if the symptoms worsen or if the condition fails to improve as anticipated.  The above assessment and management plan was discussed with the patient. The patient verbalized understanding of and has agreed to the management plan. Patient is aware to call the clinic if symptoms persist or worsen. Patient is aware when to return to the clinic for a follow-up visit. Patient educated on when it is appropriate to go to the emergency department.   Time call ended: 9:40 AM  I provided 10 minutes of  non face-to-face time during this encounter.    Daryll Drown, NP

## 2021-01-18 NOTE — Assessment & Plan Note (Signed)
Increase hydration, benzonatate for cough, prednisone taper, COVID-19 test completed results pending.  Tylenol/ibuprofen for pain or fever.

## 2021-01-19 ENCOUNTER — Telehealth: Payer: Self-pay | Admitting: Family Medicine

## 2021-01-19 LAB — SARS-COV-2, NAA 2 DAY TAT

## 2021-01-19 LAB — NOVEL CORONAVIRUS, NAA: SARS-CoV-2, NAA: NOT DETECTED

## 2021-01-28 ENCOUNTER — Telehealth: Payer: Medicaid Other

## 2021-01-28 NOTE — Telephone Encounter (Signed)
Contacted [employer] about prior authorization for fluoxetine 60mg . PA done and awaiting response.   Conf# 

## 2021-02-07 NOTE — Telephone Encounter (Signed)
Please send Fluoxetine as CAP - medicaid will cover CAPS, but not tabs.     Tabs was denied as of 02/02/21. 60 mg CAP is not made

## 2021-02-08 MED ORDER — FLUOXETINE HCL 20 MG PO CAPS: 60.0000 mg | ORAL_CAPSULE | Freq: Every day | ORAL | 1 refills | Status: DC

## 2021-02-08 NOTE — Telephone Encounter (Signed)
Sent prescription is capsules

## 2021-02-18 ENCOUNTER — Encounter: Payer: Self-pay | Admitting: Family Medicine

## 2021-02-18 ENCOUNTER — Ambulatory Visit (INDEPENDENT_AMBULATORY_CARE_PROVIDER_SITE_OTHER): Payer: Medicaid Other | Admitting: Family Medicine

## 2021-02-18 DIAGNOSIS — M19012 Primary osteoarthritis, left shoulder: Secondary | ICD-10-CM

## 2021-02-18 NOTE — Progress Notes (Signed)
Virtual Visit via telephone Note  I connected with Pam Chen on 02/18/21 at 0841 by telephone and verified that I am speaking with the correct person using two identifiers. Pam Chen is currently located at home and patient are currently with her during visit. The provider, [name] Saphia Vanderford, MD is located in their office at time of visit.  Call ended at 0848  I discussed the limitations, risks, security and privacy concerns of performing an evaluation and management service by telephone and the availability of in person appointments. I also discussed with the patient that there may be a patient responsible charge related to this service. The patient expressed understanding and agreed to proceed.   History and Present Illness: Patient had right shoulder problems in the past and had arthritis in that shoulder and now her left shoulder is now bothering her.  She had history of left shoulder issues.  Her right shoulder is better right now but her left shoulder is bothering her now.  She still has insurance now. Her left shoulder is stiff and painful and it has been worsening over the past 3-4 months and she is watch her 16 month old granddaughter. She feels like her strength is giving out.   1. Primary osteoarthritis of left shoulder     Outpatient Encounter Medications as of 02/18/2021  Medication Sig   albuterol (VENTOLIN HFA) 108 (90 Base) MCG/ACT inhaler TAKE 1-2 PUFFS AS NEEDED FOR WHEEZING/CHEST TIGHTNESS   ARIPiprazole (ABILIFY) 30 MG tablet Take 30 mg by mouth daily.   benzonatate (TESSALON PERLES) 100 MG capsule Take 1 capsule (100 mg total) by mouth 3 (three) times daily as needed for cough.   busPIRone (BUSPAR) 7.5 MG tablet TAKE 2 TABLETS (15 MG TOTAL) BY MOUTH 2 (TWO) TIMES DAILY. (Patient not taking: Reported on 10/28/2020)   dextromethorphan-guaiFENesin (MUCINEX DM) 30-600 MG 12hr tablet Take 1 tablet by mouth 2 (two) times daily.   FLUoxetine (PROZAC) 20 MG capsule Take 3  capsules (60 mg total) by mouth daily.   Fluticasone-Salmeterol (ADVAIR) 250-50 MCG/DOSE AEPB Inhale 1 puff into the lungs every 12 (twelve) hours.   Galcanezumab-gnlm (EMGALITY) 120 MG/ML SOAJ Inject 120 mg into the skin every 30 (thirty) days.   ibuprofen (ADVIL) 800 MG tablet TAKE 1 TABLET BY MOUTH EVERY 8 HOURS AS NEEDED   LINZESS 290 MCG CAPS capsule TAKE 1 CAPSULE (290 MCG TOTAL) BY MOUTH DAILY BEFORE BREAKFAST.   mirabegron ER (MYRBETRIQ) 25 MG TB24 tablet Take 1 tablet (25 mg total) by mouth daily.   Multiple Vitamin (MULTIVITAMIN WITH MINERALS) TABS tablet Take 1 tablet by mouth daily.   ondansetron (ZOFRAN-ODT) 4 MG disintegrating tablet Take 1 tablet (4 mg total) by mouth every 8 (eight) hours as needed. (Patient not taking: Reported on 10/28/2020)   predniSONE (STERAPRED UNI-PAK 21 TAB) 10 MG (21) TBPK tablet 6 tablets day 1, 5 tablet day 2, 4 tablet day 3, 3 tablet day 4, 2 tablet day 5, 1 tablet day 6.   propranolol ER (INDERAL LA) 60 MG 24 hr capsule Take 1 capsule (60 mg total) by mouth daily.   rizatriptan (MAXALT-MLT) 10 MG disintegrating tablet Take 1 tablet (10 mg total) by mouth as needed. May repeat in 2 hours if needed   topiramate (TOPAMAX) 50 MG tablet Take 2 tablets (100 mg total) by mouth at bedtime.   No facility-administered encounter medications on file as of 02/18/2021.    Review of Systems  Constitutional:  Negative for chills and fever.  Respiratory:  Negative for cough, shortness of breath and wheezing.   Cardiovascular:  Negative for chest pain, palpitations and leg swelling.  Musculoskeletal:  Positive for arthralgias. Negative for back pain and myalgias.  Skin:  Negative for rash.  Neurological:  Negative for dizziness, weakness and headaches.  Psychiatric/Behavioral:  Negative for suicidal ideas.    Observations/Objective: Patient sounds comfortable and in no acute distress  Assessment and Plan: Problem List Items Addressed This Visit   None Visit  Diagnoses     Primary osteoarthritis of left shoulder    -  Primary   Relevant Orders   Ambulatory referral to Orthopedic Surgery     Patient is ready to start talking about surgery and wants to go see orthopedic  Follow up plan: No follow-ups on file.     I discussed the assessment and treatment plan with the patient. The patient was provided an opportunity to ask questions and all were answered. The patient agreed with the plan and demonstrated an understanding of the instructions.   The patient was advised to call back or seek an in-person evaluation if the symptoms worsen or if the condition fails to improve as anticipated.  The above assessment and management plan was discussed with the patient. The patient verbalized understanding of and has agreed to the management plan. Patient is aware to call the clinic if symptoms persist or worsen. Patient is aware when to return to the clinic for a follow-up visit. Patient educated on when it is appropriate to go to the emergency department.    I provided 7 minutes of non-face-to-face time during this encounter.    Nils Pyle, MD

## 2021-02-21 ENCOUNTER — Other Ambulatory Visit: Payer: Self-pay | Admitting: Family Medicine

## 2021-02-21 DIAGNOSIS — M940 Chondrocostal junction syndrome [Tietze]: Secondary | ICD-10-CM

## 2021-02-25 ENCOUNTER — Ambulatory Visit (INDEPENDENT_AMBULATORY_CARE_PROVIDER_SITE_OTHER): Payer: Medicaid Other | Admitting: Orthopedic Surgery

## 2021-02-25 ENCOUNTER — Encounter: Payer: Self-pay | Admitting: Orthopedic Surgery

## 2021-02-25 ENCOUNTER — Ambulatory Visit: Payer: Medicaid Other

## 2021-02-25 ENCOUNTER — Other Ambulatory Visit: Payer: Self-pay

## 2021-02-25 VITALS — BP 145/93 | HR 73 | Ht 68.0 in | Wt 207.6 lb

## 2021-02-25 DIAGNOSIS — M25512 Pain in left shoulder: Secondary | ICD-10-CM

## 2021-02-25 DIAGNOSIS — M19012 Primary osteoarthritis, left shoulder: Secondary | ICD-10-CM | POA: Diagnosis not present

## 2021-02-25 DIAGNOSIS — G8929 Other chronic pain: Secondary | ICD-10-CM

## 2021-02-25 NOTE — Patient Instructions (Signed)

## 2021-02-26 ENCOUNTER — Encounter: Payer: Self-pay | Admitting: Orthopedic Surgery

## 2021-02-26 NOTE — Progress Notes (Signed)
New Patient Visit  Assessment: Pam Chen is a 50 y.o. female with the following: 1. Acute pain of left shoulder 2. Arthritis of left acromioclavicular joint   Plan: Patient has had pain in the left shoulder for several years.  Previous treatments include multiple injections, with improvement in her symptoms but without complete resolution.  She is worked with physical therapy.  She has tried home exercise programs.  She has acute worsening of her pain.  Radiographs demonstrates advanced degenerative changes of the [facility] joint.  She does localize her pain to the [facility] joint.  We discussed multiple treatment options at this time, including a subacromial steroid injection, as well as an AC joint injection.  She is interested in proceeding.  We will also obtain an MRI of the left shoulder for further evaluation.  Pending the improvements in pain from the injections, we may have to consider a surgical procedure.  All questions were answered she is amenable to this plan.  Once the MRIs been completed, she will follow-up to discuss the results.  Procedure note injection Left shoulder    Verbal consent was obtained to inject the left shoulder, subacromial space Timeout was completed to confirm the site of injection.  The skin was prepped with alcohol and ethyl chloride was sprayed at the injection site.  A 21-gauge needle was used to inject 40 mg of Depo-Medrol and 1% lidocaine (3 cc) into the subacromial space of the left shoulder using a posterolateral approach.  There were no complications. A sterile bandage was applied.   Procedure note injection Left AC joint    Verbal consent was obtained to inject the left shoulder, AC joint Timeout was completed to confirm the site of injection.  The skin was prepped with alcohol and ethyl chloride was sprayed at the injection site.  A 21-gauge needle was used to inject 40 mg of Depo-Medrol and 1% lidocaine (1 cc) into the [facility] joint of the left shoulder using a  direct superior approach.  There were no complications. A sterile bandage was applied.    Follow-up: Return for After MRI.  Subjective:  Chief Complaint  Patient presents with   Shoulder Pain    Bilateral L>R// patient states she can hardly lift her grandchild//radiates into neck    History of Present Illness: Pam Chen is a 50 y.o. female who has been referred to clinic today by Arville Care, MD for evaluation of left shoulder pain.  She said pain in the left shoulder for several years.  She has had multiple injections, but cannot explicitly tell me where these injections were.  The injections did help with her pain, but ultimately the pain returned after couple of months.  No prior injury.  Pain is now in the anterior and superior aspect of the shoulder.  She has some radiating pains into her neck.  She has difficulty lifting, including her grandchildren.  She has worked physical therapy in the past.  Home exercise program recently has not improved her pain.  She has seen another provider, who did briefly discussed the possibility of proceeding with surgery.  Of note, she does have a history of opioid abuse, but has not taken any opioids for the past 2 years.   Review of Systems: No fevers or chills No numbness or tingling No chest pain No shortness of breath No bowel or bladder dysfunction No GI distress No headaches   Medical History:  Past Medical History:  Diagnosis Date   Arthritis  Current every day smoker 03/07/2017   Diverticulitis    GERD (gastroesophageal reflux disease)    Herpes simplex type 1 infection 02/13/2019   Herpes simplex type 2 infection 02/13/2019   History of adenomatous polyp of colon 05/02/2016   Adenoma 05/02/16; repeat 04/2021; Barbaraann Share, MD [facility])   History of bipolar disorder 08/25/2019   History of partial colectomy 03/28/2016   diverticulitis   Hypertension    Hypothyroid 02/02/2017   Kidney stones    Migraine    Obesity (BMI  30.0-34.9) 02/02/2017   Polysubstance dependence including opioid type drug with complication, episodic abuse (HCC) 08/25/2019   Rotator cuff injury    Sleep apnea    CPAP   Tennis elbow    Thyroid disease     Past Surgical History:  Procedure Laterality Date   ABDOMINAL HYSTERECTOMY     COLON SURGERY     diverticulitis   LITHOTRIPSY     SLEEVE GASTROPLASTY      Family History  Problem Relation Age of Onset   Healthy Mother    Hypertension Father    Sleep apnea Father    Breast cancer Maternal Grandmother    Cancer Maternal Grandmother        breast   Alzheimer's disease Maternal Grandmother    Diabetes Maternal Grandfather    Cancer Paternal Grandmother        ovarian   Hypertension Paternal Grandfather    Heart disease Paternal Grandfather    Diabetes Paternal Grandfather    Hyperlipidemia Brother    Social History   Tobacco Use   Smoking status: Every Day    Packs/day: 1.00    Types: Cigarettes   Smokeless tobacco: Never  Vaping Use   Vaping Use: Former  Substance Use Topics   Alcohol use: No   Drug use: Not Currently    Types: Methamphetamines    Allergies  Allergen Reactions   Aspirin Other (See Comments)    Upset tummy.  Can take ibuprofen.   Oxycodone-Acetaminophen Anxiety    Can take vicodin   Tramadol Hcl Nausea Only    Current Meds  Medication Sig   albuterol (VENTOLIN HFA) 108 (90 Base) MCG/ACT inhaler TAKE 1-2 PUFFS AS NEEDED FOR WHEEZING/CHEST TIGHTNESS   ARIPiprazole (ABILIFY) 30 MG tablet Take 30 mg by mouth daily.   benzonatate (TESSALON PERLES) 100 MG capsule Take 1 capsule (100 mg total) by mouth 3 (three) times daily as needed for cough.   busPIRone (BUSPAR) 7.5 MG tablet TAKE 2 TABLETS (15 MG TOTAL) BY MOUTH 2 (TWO) TIMES DAILY.   dextromethorphan-guaiFENesin (MUCINEX DM) 30-600 MG 12hr tablet Take 1 tablet by mouth 2 (two) times daily.   FLUoxetine (PROZAC) 20 MG capsule Take 3 capsules (60 mg total) by mouth daily.    Fluticasone-Salmeterol (ADVAIR) 250-50 MCG/DOSE AEPB Inhale 1 puff into the lungs every 12 (twelve) hours.   Galcanezumab-gnlm (EMGALITY) 120 MG/ML SOAJ Inject 120 mg into the skin every 30 (thirty) days.   ibuprofen (ADVIL) 800 MG tablet TAKE 1 TABLET BY MOUTH EVERY 8 HOURS AS NEEDED   LINZESS 290 MCG CAPS capsule TAKE 1 CAPSULE (290 MCG TOTAL) BY MOUTH DAILY BEFORE BREAKFAST.   mirabegron ER (MYRBETRIQ) 25 MG TB24 tablet Take 1 tablet (25 mg total) by mouth daily.   Multiple Vitamin (MULTIVITAMIN WITH MINERALS) TABS tablet Take 1 tablet by mouth daily.   ondansetron (ZOFRAN-ODT) 4 MG disintegrating tablet Take 1 tablet (4 mg total) by mouth every 8 (eight) hours as needed.  predniSONE (STERAPRED UNI-PAK 21 TAB) 10 MG (21) TBPK tablet 6 tablets day 1, 5 tablet day 2, 4 tablet day 3, 3 tablet day 4, 2 tablet day 5, 1 tablet day 6.   propranolol ER (INDERAL LA) 60 MG 24 hr capsule Take 1 capsule (60 mg total) by mouth daily.   rizatriptan (MAXALT-MLT) 10 MG disintegrating tablet Take 1 tablet (10 mg total) by mouth as needed. May repeat in 2 hours if needed   topiramate (TOPAMAX) 50 MG tablet Take 2 tablets (100 mg total) by mouth at bedtime.    Objective: BP (!) 145/93   Pulse 73   Ht 5\' 8"  (1.727 m)   Wt 207 lb 9.6 oz (94.2 kg)   BMI 31.57 kg/m   Physical Exam:  General: Alert and oriented. and No acute distress. Gait: Normal gait.  Evaluation of the left shoulder demonstrates no deformity.  There is a prominence over the [facility] joint.  She is tenderness to palpation in this area.  She also some tenderness to palpation within the trapezius muscles.  Slightly restricted range of motion due to pain in the anterior shoulder.  Pain with cross body adduction.  4+/5 strength due to pain.  Fingers are warm and well-perfused.  2+ radial pulse.  Tach throughout the left hand.  IMAGING: I personally ordered and reviewed the following images  Through the left shoulder was obtained in clinic today  and demonstrates no acute injury.  There are advanced degenerative changes at the [facility] joint.  Loss of joint space.  Superior osteophyte formation.  Glenohumeral joint remains reduced.  Mild degenerative changes within the glenohumeral joint.  No proximal humeral migration.  Impression: Left shoulder x-ray with advanced AC joint arthritis  New Medications:  No orders of the defined types were placed in this encounter.     [facility], MD  02/26/2021 9:20 AM

## 2021-03-01 ENCOUNTER — Ambulatory Visit (INDEPENDENT_AMBULATORY_CARE_PROVIDER_SITE_OTHER): Payer: Medicaid Other | Admitting: Orthopedic Surgery

## 2021-03-01 ENCOUNTER — Encounter: Payer: Self-pay | Admitting: Orthopedic Surgery

## 2021-03-01 ENCOUNTER — Other Ambulatory Visit: Payer: Self-pay

## 2021-03-01 VITALS — BP 142/93 | HR 81

## 2021-03-01 DIAGNOSIS — M25512 Pain in left shoulder: Secondary | ICD-10-CM | POA: Diagnosis not present

## 2021-03-01 MED ORDER — PREDNISONE 10 MG (21) PO TBPK: ORAL_TABLET | ORAL | 0 refills | Status: DC

## 2021-03-01 MED ORDER — CYCLOBENZAPRINE HCL 10 MG PO TABS: 10.0000 mg | ORAL_TABLET | Freq: Two times a day (BID) | ORAL | 0 refills | Status: DC | PRN

## 2021-03-02 ENCOUNTER — Encounter: Payer: Self-pay | Admitting: Orthopedic Surgery

## 2021-03-02 NOTE — Progress Notes (Signed)
Orthopaedic Clinic Return  Assessment: Pam Chen is a 50 y.o. female with the following: Shoulder pain, less than 1 week following injection  Plan: Patient has pain in the left shoulder, limited range of motion.  She has been wearing a sling.  She states the pain today is better than it was yesterday.  At this point, it is likely a reaction to the steroid injection which she received just a few days ago.  Specifically, her left subacromial space, and left AC joint were injected.  I do anticipate that her pain will continue to improve, and reassured her that this is not on, following an injection, with up to 20% of patients experience similar symptoms.  Reviewed her left shoulder, there is a little concern for an infection.  Follow-up after we obtain the MRI.  Meds ordered this encounter  Medications   predniSONE (STERAPRED UNI-PAK 21 TAB) 10 MG (21) TBPK tablet    Sig: 10 mg DS 12 as directed    Dispense:  48 tablet    Refill:  0   cyclobenzaprine (FLEXERIL) 10 MG tablet    Sig: Take 1 tablet (10 mg total) by mouth 2 (two) times daily as needed for muscle spasms.    Dispense:  20 tablet    Refill:  0    There is no height or weight on file to calculate BMI.  Follow-up: Return for After MRI.   Subjective:  Chief Complaint  Patient presents with   Shoulder Pain    Left// pt had injection Friday morning, says pain started after the numbness wore off, pt got a sling and states the only time she feels relief is when she is wearing the sling  10/10    History of Present Illness: Pam Chen is a 50 y.o. female who returns to clinic for repeat evaluation of her left shoulder.  She had an injection just a few days ago, both within the subacromial space and within the [facility] joint.  After the lidocaine wore off, she states that she had progressively worsening pain.  She had limited range of motion, due to the pain.  She got a sling yesterday, and has been wearing that.  She has been taking  Tylenol and ibuprofen, with limited improvement in her symptoms.  Review of Systems: No fevers or chills No numbness or tingling No chest pain No shortness of breath No bowel or bladder dysfunction No GI distress No headaches   Objective: BP (!) 142/93   Pulse 81   Physical Exam:  Alert and oriented.  No acute distress.  Evaluation of left shoulder demonstrates no redness.  Injection sites are not identifiable.  She does have pain with limited range of motion.  She does have some tenderness to palpation.  Her left shoulder is not warm to the touch.  Fingers are warm and well-perfused.  Sensation is intact throughout the left hand.  2+ radial pulse.  IMAGING: I personally ordered and reviewed the following images:  No new imaging obtained today.   Oliver Barre, MD 03/02/2021 4:08 PM *

## 2021-03-15 ENCOUNTER — Ambulatory Visit (INDEPENDENT_AMBULATORY_CARE_PROVIDER_SITE_OTHER): Payer: Medicaid Other | Admitting: Orthopedic Surgery

## 2021-03-15 ENCOUNTER — Other Ambulatory Visit: Payer: Self-pay

## 2021-03-15 ENCOUNTER — Encounter: Payer: Self-pay | Admitting: Orthopedic Surgery

## 2021-03-15 VITALS — Ht 68.0 in | Wt 207.0 lb

## 2021-03-15 DIAGNOSIS — M19012 Primary osteoarthritis, left shoulder: Secondary | ICD-10-CM | POA: Diagnosis not present

## 2021-03-15 NOTE — Progress Notes (Signed)
Orthopaedic Clinic Return  Assessment: Pam Chen is a 50 y.o. female with the following: Left shoulder AC joint arthritis  Plan: Patient's pain is much better, compared to the most recent clinic visit.  Time medications have improved her symptoms.  We reviewed the MRI results in clinic today, which demonstrates no rotator cuff pathology.  She does continue to have pain at the [facility] joint, with cross body adduction, and the MRI notes advanced degenerative changes in this joint.  She is pleased with the improvements in her symptoms at this time, but notes that if the pain returns, she would be interested in surgery.  We briefly discussed surgery, which would likely include a left shoulder arthroscopy, with subacromial decompression and distal clavicle excision.  She states her understanding.  Follow-up as needed.  We did hold onto the MRI disc, for further evaluation.  Follow-up: Return if symptoms worsen or fail to improve.   Subjective:  Chief Complaint  Patient presents with   Shoulder Pain    Lt shoulder f/u MRI    History of Present Illness: Pam Chen is a 50 y.o. female who returns to clinic for repeat evaluation of her left shoulder.  She was last seen in clinic a couple weeks ago, at which time she was having severe left shoulder pain following an AC joint, and subacromial injection.  Since this follow-up visit, her pain has improved.  She is now pleased with her function overall.  She states that the pain gradually improved within a few days after the last visit.  No numbness or tingling distally.  No new injuries are noted.   Review of Systems: No fevers or chills No numbness or tingling No chest pain No shortness of breath No bowel or bladder dysfunction No GI distress No headaches   Objective: Ht 5\' 8"  (1.727 m)   Wt 207 lb (93.9 kg)   BMI 31.47 kg/m   Physical Exam:  Alert and oriented.  No acute distress.  Evaluation of left shoulder demonstrates no  redness.  Injection sites are not identifiable.  Forward flexion 140 degrees with minimal discomfort.  Abduction at her side to 90 degrees.  Tenderness palpation over the [facility] joint.  Pain with cross body abduction. Fingers are warm and well-perfused.  Sensation is intact throughout the left hand.  2+ radial pulse.  IMAGING: I personally ordered and reviewed the following images:  Left shoulder MRI was obtained at [facility], CD images were available.  Moderate degenerative changes at the [facility] joint.  No tear of the rotator cuff tendons.  No injury to the long head of the biceps tendon.   [facility], MD 03/15/2021 10:41 PM *

## 2021-03-30 ENCOUNTER — Other Ambulatory Visit: Payer: Self-pay

## 2021-03-30 ENCOUNTER — Encounter: Payer: Self-pay | Admitting: Family Medicine

## 2021-03-30 ENCOUNTER — Ambulatory Visit (INDEPENDENT_AMBULATORY_CARE_PROVIDER_SITE_OTHER): Payer: Medicaid Other | Admitting: Family Medicine

## 2021-03-30 VITALS — BP 117/75 | HR 59 | Ht 68.0 in | Wt 207.0 lb

## 2021-03-30 DIAGNOSIS — J40 Bronchitis, not specified as acute or chronic: Secondary | ICD-10-CM

## 2021-03-30 DIAGNOSIS — F410 Panic disorder [episodic paroxysmal anxiety] without agoraphobia: Secondary | ICD-10-CM | POA: Diagnosis not present

## 2021-03-30 DIAGNOSIS — F411 Generalized anxiety disorder: Secondary | ICD-10-CM

## 2021-03-30 DIAGNOSIS — F319 Bipolar disorder, unspecified: Secondary | ICD-10-CM | POA: Diagnosis not present

## 2021-03-30 DIAGNOSIS — S161XXA Strain of muscle, fascia and tendon at neck level, initial encounter: Secondary | ICD-10-CM

## 2021-03-30 MED ORDER — CYCLOBENZAPRINE HCL 10 MG PO TABS: 10.0000 mg | ORAL_TABLET | Freq: Three times a day (TID) | ORAL | 2 refills | Status: DC | PRN

## 2021-03-30 MED ORDER — FLUOXETINE HCL 20 MG PO CAPS: 60.0000 mg | ORAL_CAPSULE | Freq: Every day | ORAL | 1 refills | Status: DC

## 2021-03-30 MED ORDER — ALBUTEROL SULFATE HFA 108 (90 BASE) MCG/ACT IN AERS: INHALATION_SPRAY | RESPIRATORY_TRACT | 1 refills | Status: DC

## 2021-03-30 MED ORDER — BUSPIRONE HCL 7.5 MG PO TABS: 15.0000 mg | ORAL_TABLET | Freq: Two times a day (BID) | ORAL | 1 refills | Status: DC

## 2021-03-30 NOTE — Progress Notes (Signed)
BP 117/75   Pulse (!) 59   Ht 5\' 8"  (1.727 m)   Wt 207 lb (93.9 kg)   SpO2 98%   BMI 31.47 kg/m    Subjective:   Patient ID: Pam Chen, female    DOB: 06-Apr-1971, 50 y.o.   MRN: [id number]  HPI: Pam Chen is a 50 y.o. female presenting on 03/30/2021 for Neck Pain   HPI Neck muscle pain Patient is coming in today with complaints of neck muscle pain.  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  She denies any numbness or weakness in either of her arms but she does get a tingling and burning and sharp sensation in both of her upper shoulders just below her neck and not really when the pain and tightness is for the most part.  Relevant past medical, surgical, family and social history reviewed and updated as indicated. Interim medical history since our last visit reviewed. Allergies and medications reviewed and updated.  Review of Systems  Constitutional:  Negative for chills and fever.  Respiratory:  Negative for chest tightness and shortness of breath.   Cardiovascular:  Negative for chest pain and leg swelling.  Musculoskeletal:  Positive for myalgias and neck pain. Negative for back pain and gait problem.  Skin:  Negative for rash.  Neurological:  Negative for dizziness, light-headedness and headaches.  Psychiatric/Behavioral:  Negative for agitation and behavioral problems.   All other systems reviewed and are negative.  Per HPI unless specifically indicated above   Allergies as of 03/30/2021       Reactions   Aspirin Other (See Comments)   Upset tummy.  Can take ibuprofen.   Oxycodone-acetaminophen Anxiety   Can take vicodin   Tramadol Hcl Nausea Only        Medication List        Accurate as of March 30, 2021  2:45 PM. If you have any questions, ask your nurse or doctor.          STOP taking  these medications    benzonatate 100 MG capsule Commonly known as: April 01, 2021 Stopped by: Lawyer, MD   cyclobenzaprine 10 MG tablet Commonly known as: FLEXERIL Stopped by: Nils Pyle, MD   dextromethorphan-guaiFENesin 30-600 MG 12hr tablet Commonly known as: MUCINEX DM Stopped by: Nils Pyle Caylah Plouff, MD   predniSONE 10 MG (21) Tbpk tablet Commonly known as: STERAPRED UNI-PAK 21 TAB Stopped by: [name] Tatjana Turcott, MD   propranolol ER 60 MG 24 hr capsule Commonly known as: Inderal LA Stopped by: [name] Rudolpho Claxton, MD   topiramate 50 MG tablet Commonly known as: Topamax Stopped by: [name], MD       TAKE these medications    albuterol 108 (90 Base) MCG/ACT inhaler Commonly known as: VENTOLIN HFA TAKE 1-2 PUFFS AS NEEDED FOR WHEEZING/CHEST TIGHTNESS   ARIPiprazole 30 MG tablet Commonly known as: ABILIFY Take 30 mg by mouth daily.   busPIRone 7.5 MG tablet Commonly known as: BUSPAR Take 2 tablets (15 mg total) by mouth 2 (two) times daily.   Emgality 120 MG/ML Soaj Generic drug: Galcanezumab-gnlm Inject 120 mg into the skin every 30 (thirty) days.   FLUoxetine 20 MG capsule Commonly known as: PROZAC Take 3 capsules (60 mg total) by mouth daily.   Fluticasone-Salmeterol 250-50  MCG/DOSE Aepb Commonly known as: ADVAIR Inhale 1 puff into the lungs every 12 (twelve) hours.   ibuprofen 800 MG tablet Commonly known as: ADVIL TAKE 1 TABLET BY MOUTH EVERY 8 HOURS AS NEEDED   Linzess 290 MCG Caps capsule Generic drug: linaclotide TAKE 1 CAPSULE (290 MCG TOTAL) BY MOUTH DAILY BEFORE BREAKFAST.   mirabegron ER 25 MG Tb24 tablet Commonly known as: Myrbetriq Take 1 tablet (25 mg total) by mouth daily.   multivitamin with minerals Tabs tablet Take 1 tablet by mouth daily.   ondansetron 4 MG disintegrating tablet Commonly known as: ZOFRAN-ODT Take 1 tablet (4 mg total) by mouth every 8 (eight) hours as needed.   rizatriptan 10  MG disintegrating tablet Commonly known as: Maxalt-MLT Take 1 tablet (10 mg total) by mouth as needed. May repeat in 2 hours if needed         Objective:   BP 117/75   Pulse (!) 59   Ht 5\' 8"  (1.727 m)   Wt 207 lb (93.9 kg)   SpO2 98%   BMI 31.47 kg/m   Wt Readings from Last 3 Encounters:  03/30/21 207 lb (93.9 kg)  03/15/21 207 lb (93.9 kg)  02/25/21 207 lb 9.6 oz (94.2 kg)    Physical Exam Vitals and nursing note reviewed.  Constitutional:      Appearance: Normal appearance.  Musculoskeletal:       Back:  Neurological:     Mental Status: She is alert.      Assessment & Plan:   Problem List Items Addressed This Visit       Other   Bipolar 1 disorder, depressed (HCC)   Relevant Medications   busPIRone (BUSPAR) 7.5 MG tablet   Other Visit Diagnoses     Strain of neck muscle, initial encounter    -  Primary   Bronchitis       Relevant Medications   albuterol (VENTOLIN HFA) 108 (90 Base) MCG/ACT inhaler   Panic attack       Relevant Medications   FLUoxetine (PROZAC) 20 MG capsule   busPIRone (BUSPAR) 7.5 MG tablet   GAD (generalized anxiety disorder)       Relevant Medications   FLUoxetine (PROZAC) 20 MG capsule   busPIRone (BUSPAR) 7.5 MG tablet       Patient wanted refill on some of her meds.  The main reason for visit is for neck pain.  Likely muscular in nature, also offered physical therapy but patient said due to her new job and time restraints cannot get time off work. Follow up plan: No follow-ups on file.  Counseling provided for all of the vaccine components No orders of the defined types were placed in this encounter.   02/27/21, MD [facility] 03/30/2021, 2:45 PM

## 2021-04-02 ENCOUNTER — Other Ambulatory Visit: Payer: Self-pay | Admitting: Family Medicine

## 2021-04-02 DIAGNOSIS — M940 Chondrocostal junction syndrome [Tietze]: Secondary | ICD-10-CM

## 2021-04-13 ENCOUNTER — Other Ambulatory Visit: Payer: Self-pay | Admitting: Family Medicine

## 2021-04-13 DIAGNOSIS — K5900 Constipation, unspecified: Secondary | ICD-10-CM

## 2021-06-07 ENCOUNTER — Other Ambulatory Visit: Payer: Self-pay

## 2021-06-07 ENCOUNTER — Ambulatory Visit: Payer: Medicaid Other | Admitting: Orthopedic Surgery

## 2021-06-07 ENCOUNTER — Encounter: Payer: Self-pay | Admitting: Orthopedic Surgery

## 2021-06-07 VITALS — BP 127/88 | HR 74 | Ht 68.0 in | Wt 200.0 lb

## 2021-06-07 DIAGNOSIS — M19012 Primary osteoarthritis, left shoulder: Secondary | ICD-10-CM

## 2021-06-07 NOTE — Progress Notes (Signed)
Orthopaedic Clinic Return  Assessment: Pam Chen is a 51 y.o. female with the following: Left shoulder AC joint arthritis  Plan: Patient had done well following previous injection, until approximately 4-5 days ago.  Notes exquisite pain directly over the [facility] joint.  This is limiting her range of motion.  She would like to proceed with another steroid injection.  She is not interested in surgery at this time, but states she will have improved benefits at work, and may be able to proceed with surgery relatively soon.  I recommended continued use of over-the-counter medications, ice and adding Voltaren gel.  Follow-up as needed.     Procedure note injection Left AC joint    Verbal consent was obtained to inject the left shoulder, AC joint Timeout was completed to confirm the site of injection.  The skin was prepped with alcohol and ethyl chloride was sprayed at the injection site.  A 21-gauge needle was used to inject 40 mg of Depo-Medrol and 1% lidocaine (1 cc) into the [facility] joint of the left shoulder using a direct superior approach.  There were no complications. A sterile bandage was applied.   Follow-up: Return if symptoms worsen or fail to improve.   Subjective:  Chief Complaint  Patient presents with   Shoulder Pain    Lt shoulder pain worse over the past 4-5 days. Last 2 days states she can't move it.     History of Present Illness: Pam Chen is a 51 y.o. female who returns to clinic for repeat evaluation of her left shoulder.  She has been evaluated for [facility] joint arthritis, including an MRI which demonstrated no additional pathology.  Previous injections provided excellent relief of her symptoms until just recently.  Approximate 4-5 days ago, she started to experience more pain in the superior aspect of her shoulder.  It is made motion very painful.  She has been taking medications, with limited improvement in her symptoms.  She is not yet ready to proceed with surgery, and is  interested in a follow-up injection.   Review of Systems: No fevers or chills No numbness or tingling No chest pain No shortness of breath No bowel or bladder dysfunction No GI distress No headaches   Objective: BP 127/88    Pulse 74    Ht 5\' 8"  (1.727 m)    Wt 200 lb (90.7 kg)    BMI 30.41 kg/m   Physical Exam:  Alert and oriented.  No acute distress.  Evaluation left shoulder demonstrates no deformity.  No atrophy is appreciated.  There is no bruising.  She has exquisite tenderness to palpation directly over the [facility] joint.  Mild surrounding discomfort.  Pain is reproduced at the [facility] joint with minimal motion.  It is painful for cross body adduction.  Fingers are warm and well-perfused.  Sensation is intact in the left hand.  IMAGING: I personally ordered and reviewed the following images:  No new imaging obtained today.   [facility], MD 06/07/2021 10:32 AM *

## 2021-06-07 NOTE — Patient Instructions (Signed)
Instructions Following Joint Injections  In clinic today, you received an injection in one of your joints (sometimes more than one).  Occasionally, you can have some pain at the injection site, this is normal.  You can place ice at the injection site, or take over-the-counter medications such as Tylenol (acetaminophen) or Advil (ibuprofen).  Please follow all directions listed on the bottle.  If your joint (knee or shoulder) becomes swollen, red or very painful, please contact the clinic for additional assistance.   Two medications were injected, including lidocaine and a steroid (often referred to as cortisone).  Lidocaine is effective almost immediately but wears off quickly.  However, the steroid can take a few days to improve your symptoms.  In some cases, it can make your pain worse for a couple of days.  Do not be concerned if this happens as it is common.  You can apply ice or take some over-the-counter medications as needed.            Smoking Tobacco Information, Adult Smoking tobacco can be harmful to your health. Tobacco contains a poisonous (toxic), colorless chemical called nicotine. Nicotine is addictive. It changes the brain and can make it hard to stop smoking. Tobacco also has other toxic chemicals that can hurt your body and raise your risk of many cancers. How can smoking tobacco affect me? Smoking tobacco puts you at risk for: Cancer. Smoking is most commonly associated with lung cancer, but can also lead to cancer in other parts of the body. Chronic obstructive pulmonary disease (COPD). This is a long-term lung condition that makes it hard to breathe. It also gets worse over time. High blood pressure (hypertension), heart disease, stroke, or heart attack. Lung infections, such as pneumonia. Cataracts. This is when the lenses in the eyes become clouded. Digestive problems. This may include peptic ulcers, heartburn, and gastroesophageal reflux disease (GERD). Oral health  problems, such as gum disease and tooth loss. Loss of taste and smell. Smoking can affect your appearance by causing: Wrinkles. Yellow or stained teeth, fingers, and fingernails. Smoking tobacco can also affect your social life, because: It may be challenging to find places to smoke when away from home. Many workplaces, [employer], hotels, and public places are tobacco-free. Smoking is expensive. This is due to the cost of tobacco and the long-term costs of treating health problems from smoking. Secondhand smoke may affect those around you. Secondhand smoke can cause lung cancer, breathing problems, and heart disease. Children of smokers have a higher risk for: Sudden infant death syndrome (SIDS). Ear infections. Lung infections. If you currently smoke tobacco, quitting now can help you: Lead a longer and healthier life. Look, smell, breathe, and feel better over time. Save money. Protect others from the harms of secondhand smoke. What actions can I take to prevent health problems? Quit smoking  Do not start smoking. Quit if you already do. Make a plan to quit smoking and commit to it. Look for programs to help you and ask your health care provider for recommendations and ideas. Set a date and write down all the reasons you want to quit. Let your friends and family know you are quitting so they can help and support you. Consider finding friends who also want to quit. It can be easier to quit with someone else, so that you can support each other. Talk with your health care provider about using nicotine replacement medicines to help you quit, such as gum, lozenges, patches, sprays, or pills. Do not  replace cigarette smoking with electronic cigarettes, which are commonly called e-cigarettes. The safety of e-cigarettes is not known, and some may contain harmful chemicals. If you try to quit but return to smoking, stay positive. It is common to slip up when you first quit, so take it one day at  a time. Be prepared for cravings. When you feel the urge to smoke, chew gum or suck on hard candy. Lifestyle Stay busy and take care of your body. Drink enough fluid to keep your urine pale yellow. Get plenty of exercise and eat a healthy diet. This can help prevent weight gain after quitting. Monitor your eating habits. Quitting smoking can cause you to have a larger appetite than when you smoke. Find ways to relax. Go out with friends or family to a movie or a restaurant where people do not smoke. Ask your health care provider about having regular tests (screenings) to check for cancer. This may include blood tests, imaging tests, and other tests. Find ways to manage your stress, such as meditation, yoga, or exercise. Where to find support To get support to quit smoking, consider: Asking your health care provider for more information and resources. Taking classes to learn more about quitting smoking. Looking for local organizations that offer resources about quitting smoking. Joining a support group for people who want to quit smoking in your local community. Calling the smokefree.gov counselor helpline: 1-800-Quit-Now (270)873-2230) Where to find more information You may find more information about quitting smoking from: HelpGuide.org: www.helpguide.org [employer]: smokefree.gov American Lung Association: www.lung.org Contact a health care provider if you: Have problems breathing. Notice that your lips, nose, or fingers turn blue. Have chest pain. Are coughing up blood. Feel faint or you pass out. Have other health changes that cause you to worry. Summary Smoking tobacco can negatively affect your health, the health of those around you, your finances, and your social life. Do not start smoking. Quit if you already do. If you need help quitting, ask your health care provider. Think about joining a support group for people who want to quit smoking in your local community. There  are many effective programs that will help you to quit this behavior. This information is not intended to replace advice given to you by your health care provider. Make sure you discuss any questions you have with your health care provider. Document Revised: 01/17/2021 Document Reviewed: 04/06/2020 Elsevier Patient Education  2022 [employer].

## 2021-06-08 NOTE — Telephone Encounter (Signed)
Will close encounter

## 2021-06-09 ENCOUNTER — Other Ambulatory Visit: Payer: Self-pay | Admitting: Family Medicine

## 2021-06-09 DIAGNOSIS — M940 Chondrocostal junction syndrome [Tietze]: Secondary | ICD-10-CM

## 2021-06-22 ENCOUNTER — Ambulatory Visit: Payer: Medicaid Other | Admitting: Family Medicine

## 2021-06-22 ENCOUNTER — Encounter: Payer: Self-pay | Admitting: Family Medicine

## 2021-06-22 VITALS — BP 126/78 | HR 71 | Temp 98.4°F | Ht 68.0 in | Wt 199.0 lb

## 2021-06-22 DIAGNOSIS — R6889 Other general symptoms and signs: Secondary | ICD-10-CM | POA: Diagnosis not present

## 2021-06-22 DIAGNOSIS — J069 Acute upper respiratory infection, unspecified: Secondary | ICD-10-CM | POA: Diagnosis not present

## 2021-06-22 LAB — VERITOR FLU A/B WAIVED
Influenza A: NEGATIVE
Influenza B: NEGATIVE

## 2021-06-22 MED ORDER — PSEUDOEPH-BROMPHEN-DM 30-2-10 MG/5ML PO SYRP: 5.0000 mL | ORAL_SOLUTION | Freq: Four times a day (QID) | ORAL | 0 refills | Status: DC | PRN

## 2021-06-22 NOTE — Patient Instructions (Signed)
It appears that you have a viral upper respiratory infection (cold).  Cold symptoms can last up to 2 weeks.   ° °- Get plenty of rest and drink plenty of fluids. °- Try to breathe moist air. Use a cold mist humidifier. °- Consume warm fluids (soup or tea) to provide relief for a stuffy nose and to loosen phlegm. °- For cough and congestion you can use plain Mucinex, regular or max strength, follow box directions.  °- For nasal stuffiness, try saline nasal spray or a Neti Pot. You can use saline nasal spray 4 times daily. Do not use tap water in the Neti Pot, follow instructions on box for proper use. Afrin nasal spray can also be used but this product should not be used longer than 3 days or it will cause rebound nasal stuffiness (worsening nasal congestion). °- For sore throat pain relief: use chloraseptic spray, suck on throat lozenges, hard candy or popsicles; gargle with warm salt water (1/4 tsp. salt per 8 oz. of water); and eat soft, bland foods. °- For fever or aches and pains take tylenol or motrin as appropriate for age and weight.  °- Eat a well-balanced diet. If you cannot, ensure you are getting enough nutrients by taking a daily multivitamin. °- Avoid dairy products, as they can thicken phlegm. °- Avoid alcohol, as it impairs your body’s immune system. °- Change your toothbrush in 3 days.  ° °CONTACT YOUR DOCTOR IF YOU EXPERIENCE ANY OF THE FOLLOWING: °- High fever °- Ear pain °- Sinus-type headache °- Unusually severe cold symptoms °- Cough that gets worse while other cold symptoms improve °- Flare up of any chronic lung problem, such as asthma or COPD °- Your symptoms persist longer than 2 weeks ° °

## 2021-06-22 NOTE — Progress Notes (Signed)
Subjective:  Patient ID: Pam Chen, female    DOB: 1971/02/18, 51 y.o.   MRN: [id number]  Patient Care Team: [name], Fransisca Kaufmann, MD as PCP - General (Family Medicine)   Chief Complaint:  cold/flu like symptoms (Congestion/rhinorrhea/No taste or smell) and URI   HPI: Pam Chen is a 51 y.o. female presenting on 06/22/2021 for cold/flu like symptoms (Congestion/rhinorrhea/No taste or smell) and URI   URI  This is a new problem. Episode onset: 3 days ago. The problem has been gradually worsening. Associated symptoms include congestion, coughing, headaches, a plugged ear sensation, rhinorrhea, a sore throat and swollen glands. Pertinent negatives include no abdominal pain, chest pain, diarrhea, dysuria, ear pain, joint pain, joint swelling, nausea, neck pain, rash, sinus pain, sneezing, vomiting or wheezing. She has tried NSAIDs for the symptoms. The treatment provided no relief.     Relevant past medical, surgical, family, and social history reviewed and updated as indicated.  Allergies and medications reviewed and updated. Data reviewed: Chart in Epic.   Past Medical History:  Diagnosis Date   Arthritis    Current every day smoker 03/07/2017   Diverticulitis    GERD (gastroesophageal reflux disease)    Herpes simplex type 1 infection 02/13/2019   Herpes simplex type 2 infection 02/13/2019   History of adenomatous polyp of colon 05/02/2016   Adenoma 05/02/16; repeat 04/2021; [name], MD [employer])   History of bipolar disorder 08/25/2019   History of partial colectomy 03/28/2016   diverticulitis   Hypertension    Hypothyroid 02/02/2017   Kidney stones    Migraine    Obesity (BMI 30.0-34.9) 02/02/2017   Polysubstance dependence including opioid type drug with complication, episodic abuse ([place]) 08/25/2019   Rotator cuff injury    Sleep apnea    CPAP   Tennis elbow    Thyroid disease     Past Surgical History:  Procedure Laterality Date   ABDOMINAL HYSTERECTOMY     COLON  SURGERY     diverticulitis   LITHOTRIPSY     SLEEVE GASTROPLASTY      Social History   Socioeconomic History   Marital status: Single    Spouse name: Not on file   Number of children: 3   Years of education: GED   Highest education level: Not on file  Occupational History   Occupation: drives fork lift/loads trucks in [employer]  Tobacco Use   Smoking status: Every Day    Packs/day: 1.00    Types: Cigarettes   Smokeless tobacco: Never  Vaping Use   Vaping Use: Former  Substance and Sexual Activity   Alcohol use: No   Drug use: Not Currently    Types: Methamphetamines   Sexual activity: Not on file  Other Topics Concern   Not on file  Social History Narrative   Lives at home with fiance and son.   Right-handed.   2 cups coffee, 1 glass of sweet tea per day.   Social Determinants of Health   Financial Resource Strain: Not on file  Food Insecurity: Not on file  Transportation Needs: Not on file  Physical Activity: Not on file  Stress: Not on file  Social Connections: Not on file  Intimate Partner Violence: Not on file    Outpatient Encounter Medications as of 06/22/2021  Medication Sig   brompheniramine-pseudoephedrine-DM 30-2-10 MG/5ML syrup Take 5 mLs by mouth 4 (four) times daily as needed.   albuterol (VENTOLIN HFA) 108 (90 Base) MCG/ACT inhaler TAKE 1-2  PUFFS AS NEEDED FOR WHEEZING/CHEST TIGHTNESS   ARIPiprazole (ABILIFY) 30 MG tablet Take 30 mg by mouth daily.   busPIRone (BUSPAR) 7.5 MG tablet Take 2 tablets (15 mg total) by mouth 2 (two) times daily.   cyclobenzaprine (FLEXERIL) 10 MG tablet Take 1 tablet (10 mg total) by mouth 3 (three) times daily as needed for muscle spasms.   FLUoxetine (PROZAC) 20 MG capsule Take 3 capsules (60 mg total) by mouth daily.   Fluticasone-Salmeterol (ADVAIR) 250-50 MCG/DOSE AEPB Inhale 1 puff into the lungs every 12 (twelve) hours.   Galcanezumab-gnlm (EMGALITY) 120 MG/ML SOAJ Inject 120 mg into the skin every 30 (thirty)  days.   ibuprofen (ADVIL) 800 MG tablet TAKE 1 TABLET BY MOUTH EVERY 8 HOURS AS NEEDED   LINZESS 290 MCG CAPS capsule TAKE 1 CAPSULE BY MOUTH DAILY BEFORE BREAKFAST.   mirabegron ER (MYRBETRIQ) 25 MG TB24 tablet Take 1 tablet (25 mg total) by mouth daily.   Multiple Vitamin (MULTIVITAMIN WITH MINERALS) TABS tablet Take 1 tablet by mouth daily.   ondansetron (ZOFRAN-ODT) 4 MG disintegrating tablet Take 1 tablet (4 mg total) by mouth every 8 (eight) hours as needed.   rizatriptan (MAXALT-MLT) 10 MG disintegrating tablet Take 1 tablet (10 mg total) by mouth as needed. May repeat in 2 hours if needed   No facility-administered encounter medications on file as of 06/22/2021.    Allergies  Allergen Reactions   Aspirin Other (See Comments)    Upset tummy.  Can take ibuprofen.   Oxycodone-Acetaminophen Anxiety    Can take vicodin   Tramadol Hcl Nausea Only    Review of Systems  Constitutional:  Positive for activity change, appetite change, chills and fatigue. Negative for diaphoresis, fever and unexpected weight change.  HENT:  Positive for congestion, postnasal drip, rhinorrhea and sore throat. Negative for dental problem, drooling, ear discharge, ear pain, facial swelling, hearing loss, mouth sores, nosebleeds, sinus pressure, sinus pain, sneezing, tinnitus, trouble swallowing and voice change.   Eyes:  Negative for photophobia and visual disturbance.  Respiratory:  Positive for cough. Negative for shortness of breath and wheezing.   Cardiovascular:  Negative for chest pain.  Gastrointestinal:  Negative for abdominal pain, diarrhea, nausea and vomiting.  Genitourinary:  Negative for decreased urine volume, difficulty urinating and dysuria.  Musculoskeletal:  Positive for myalgias. Negative for arthralgias, back pain, gait problem, joint pain, joint swelling, neck pain and neck stiffness.  Skin:  Negative for rash.  Neurological:  Positive for headaches. Negative for dizziness, tremors,  seizures, syncope, facial asymmetry, speech difficulty, weakness, light-headedness and numbness.  Psychiatric/Behavioral:  Negative for confusion.   All other systems reviewed and are negative.      Objective:  BP 126/78    Pulse 71    Temp 98.4 F (36.9 C)    Ht 5\' 8"  (1.727 m)    Wt 199 lb (90.3 kg)    SpO2 100%    BMI 30.26 kg/m    Wt Readings from Last 3 Encounters:  06/22/21 199 lb (90.3 kg)  06/07/21 200 lb (90.7 kg)  03/30/21 207 lb (93.9 kg)    Physical Exam  Results for orders placed or performed in visit on 01/18/21  Novel Coronavirus, NAA ([facility])   Specimen: Nasopharyngeal(NP) swabs in vial transport medium  Result Value Ref Range   SARS-CoV-2, NAA Not Detected Not Detected  SARS-COV-2, NAA 2 DAY TAT  Result Value Ref Range   SARS-CoV-2, NAA 2 DAY TAT Performed  Pertinent labs & imaging results that were available during my care of the patient were reviewed by me and considered in my medical decision making.  Assessment & Plan:  Pam Chen was seen today for cold/flu like symptoms and uri.  Diagnoses and all orders for this visit:  Flu-like symptoms URI with cough and congestion Influenza negative in office. COVID testing pending. Will initiate antiviral therapy if warranted. Symptomatic care discussed in detail. Report any new, worsening, or persistent symptoms. Bromfed as prescribed.  -     Veritor Flu A/B Waived -     Novel Coronavirus, NAA ([facility]) -     brompheniramine-pseudoephedrine-DM 30-2-10 MG/5ML syrup; Take 5 mLs by mouth 4 (four) times daily as needed.     Continue all other maintenance medications.  Follow up plan: Return if symptoms worsen or fail to improve.   Continue healthy lifestyle choices, including diet (rich in fruits, vegetables, and lean proteins, and low in salt and simple carbohydrates) and exercise (at least 30 minutes of moderate physical activity daily).  Educational handout given for URI  The above assessment and  management plan was discussed with the patient. The patient verbalized understanding of and has agreed to the management plan. Patient is aware to call the clinic if they develop any new symptoms or if symptoms persist or worsen. Patient is aware when to return to the clinic for a follow-up visit. Patient educated on when it is appropriate to go to the emergency department.   Monia Pouch, FNP-C [facility] [phone]

## 2021-06-28 LAB — NOVEL CORONAVIRUS, NAA

## 2021-07-20 ENCOUNTER — Other Ambulatory Visit: Payer: Self-pay | Admitting: Family Medicine

## 2021-07-20 DIAGNOSIS — M940 Chondrocostal junction syndrome [Tietze]: Secondary | ICD-10-CM

## 2021-07-20 NOTE — Telephone Encounter (Signed)
Last office visit 03/30/21 Last refill 06/10/21, #60, no refills

## 2021-07-29 ENCOUNTER — Other Ambulatory Visit: Payer: Self-pay

## 2021-07-29 ENCOUNTER — Emergency Department (HOSPITAL_COMMUNITY)
Admission: EM | Admit: 2021-07-29 | Discharge: 2021-07-29 | Discharge status: Against Medical Advice | Payer: Medicaid Other | Attending: Student | Admitting: Student

## 2021-07-29 ENCOUNTER — Emergency Department (HOSPITAL_COMMUNITY): Payer: Medicaid Other

## 2021-07-29 ENCOUNTER — Encounter (HOSPITAL_COMMUNITY): Payer: Self-pay | Admitting: Emergency Medicine

## 2021-07-29 DIAGNOSIS — Z5321 Procedure and treatment not carried out due to patient leaving prior to being seen by health care provider: Secondary | ICD-10-CM | POA: Diagnosis not present

## 2021-07-29 DIAGNOSIS — K5900 Constipation, unspecified: Secondary | ICD-10-CM | POA: Insufficient documentation

## 2021-07-29 LAB — COMPREHENSIVE METABOLIC PANEL
ALT: 11 U/L (ref 0–44)
AST: 34 U/L (ref 15–41)
Albumin: 3.3 g/dL — ABNORMAL LOW (ref 3.5–5.0)
Alkaline Phosphatase: 71 U/L (ref 38–126)
Anion gap: 8 (ref 5–15)
BUN: 9 mg/dL (ref 6–20)
CO2: 28 mmol/L (ref 22–32)
Calcium: 9.1 mg/dL (ref 8.9–10.3)
Chloride: 101 mmol/L (ref 98–111)
Creatinine, Ser: 1.04 mg/dL — ABNORMAL HIGH (ref 0.44–1.00)
GFR, Estimated: >60 mL/min (ref >60)
Glucose, Bld: 169 mg/dL — ABNORMAL HIGH (ref 70–99)
Potassium: 4.1 mmol/L (ref 3.5–5.1)
Sodium: 137 mmol/L (ref 135–145)
Total Bilirubin: 2 mg/dL — ABNORMAL HIGH (ref 0.3–1.2)
Total Protein: 6 g/dL — ABNORMAL LOW (ref 6.5–8.1)

## 2021-07-29 LAB — CBC WITH DIFFERENTIAL/PLATELET
Abs Immature Granulocytes: 0.04 10*3/uL (ref 0.00–0.07)
Basophils Absolute: 0 10*3/uL (ref 0.0–0.1)
Basophils Relative: 0 %
Eosinophils Absolute: 0.1 10*3/uL (ref 0.0–0.5)
Eosinophils Relative: 1 %
HCT: 38.1 % (ref 36.0–46.0)
Hemoglobin: 13.1 g/dL (ref 12.0–15.0)
Immature Granulocytes: 1 %
Lymphocytes Relative: 27 %
Lymphs Abs: 1.8 10*3/uL (ref 0.7–4.0)
MCH: 30.4 pg (ref 26.0–34.0)
MCHC: 34.4 g/dL (ref 30.0–36.0)
MCV: 88.4 fL (ref 80.0–100.0)
Monocytes Absolute: 0.5 10*3/uL (ref 0.1–1.0)
Monocytes Relative: 7 %
Neutro Abs: 4.5 10*3/uL (ref 1.7–7.7)
Neutrophils Relative %: 64 %
Platelets: 277 10*3/uL (ref 150–400)
RBC: 4.31 MIL/uL (ref 3.87–5.11)
RDW: 12.4 % (ref 11.5–15.5)
WBC: 6.9 10*3/uL (ref 4.0–10.5)
nRBC: 0 % (ref 0.0–0.2)

## 2021-07-29 LAB — LIPASE, BLOOD: Lipase: 25 U/L (ref 11–51)

## 2021-07-29 MED ORDER — IOHEXOL 300 MG/ML  SOLN
100.0000 mL | Freq: Once | INTRAMUSCULAR | Status: AC | PRN
  Administered 2021-07-29: 100 mL via INTRAVENOUS

## 2021-07-29 NOTE — ED Notes (Signed)
Patient iv was removed(r)wriist.before patient leaves ?

## 2021-07-29 NOTE — ED Provider Triage Note (Signed)
Emergency Medicine Provider Triage Evaluation Note ? ?Pam Chen , a 51 y.o. female  was evaluated in triage.  Pt complains of constipation and abdominal pain.  Has not been able to have a bowel movement for the past week.  Worsening abdominal pain over the past 2 to 3 days.  Feels like she is impacted.  She has tried 3 enemas as well as suppositories at home and tried to disimpact herself without relief.  She has a history of constipation and takes Linzess but reports this is the worst episode she has ever had.  She is concerned there may be an internal issue, history of previous colon surgery. ? ?Review of Systems  ?Positive: Constipation, abdominal pain ?Negative: Fever, vomiting, blood in stool ? ?Physical Exam  ?BP (!) 121/104   Pulse 98   Temp 98 ?F (36.7 ?C) (Oral)   Resp 16   SpO2 99%  ?Gen:   Awake, no distress   ?Resp:  Normal effort  ?MSK:   Moves extremities without difficulty  ?Other:  Mild generalized abdominal tenderness, hypoactive bowel sounds ? ?Medical Decision Making  ?Medically screening exam initiated at 12:17 AM.  Appropriate orders placed.  Pam Chen was informed that the remainder of the evaluation will be completed by another provider, this initial triage assessment does not replace that evaluation, and the importance of remaining in the ED until their evaluation is complete. ? ? ?  ?Dartha Lodge, PA-C ?07/29/21 0025 ? ?

## 2021-07-29 NOTE — ED Triage Notes (Signed)
Pt reported to ED with c/o constipation and bowel impaction x1 week. Pt states severe pain has occurred over past few days. Reports using different methods to relieve bowels including 3 enemas, linzess and suppositories with no relief in symptoms. Reports hx of rupture colon. Denies any emesis but reports severe nausea.  ?

## 2021-08-15 ENCOUNTER — Other Ambulatory Visit: Payer: Self-pay | Admitting: Family Medicine

## 2021-08-15 DIAGNOSIS — M940 Chondrocostal junction syndrome [Tietze]: Secondary | ICD-10-CM

## 2021-08-15 NOTE — Telephone Encounter (Signed)
Last office visit 03/30/21 ?Last refill 07/20/21, #60, no refills ?

## 2021-09-20 ENCOUNTER — Other Ambulatory Visit: Payer: Self-pay | Admitting: Neurology

## 2021-09-30 ENCOUNTER — Other Ambulatory Visit: Payer: Self-pay | Admitting: Neurology

## 2021-10-03 ENCOUNTER — Other Ambulatory Visit: Payer: Self-pay | Admitting: Emergency Medicine

## 2021-10-03 ENCOUNTER — Telehealth: Payer: Self-pay | Admitting: Family Medicine

## 2021-10-03 MED ORDER — RIZATRIPTAN BENZOATE 10 MG PO TBDP: 10.0000 mg | ORAL_TABLET | ORAL | 0 refills | Status: DC | PRN

## 2021-10-03 NOTE — Telephone Encounter (Signed)
Prescription sent to pharmacy.

## 2021-10-03 NOTE — Telephone Encounter (Signed)
?  Prescription Request ? ?10/03/2021 ? ?Is this a "Controlled Substance" medicine? NO ? ?Have you seen your PCP in the last 2 weeks? No pt has appt with PCP on 11/09/21 need refill ? ?If YES, route message to pool  -  If NO, patient needs to be scheduled for appointment. ? ?What is the name of the medication or equipment? rizatriptan (MAXALT-MLT) 10 MG disintegrating tablet  ? ?Have you contacted your pharmacy to request a refill? yes  ? ?Which pharmacy would you like this sent to? [facility]  ? ? ?Patient notified that their request is being sent to the clinical staff for review and that they should receive a response within 2 business days.  ? ? ?

## 2021-10-04 ENCOUNTER — Telehealth: Payer: Self-pay | Admitting: Family Medicine

## 2021-10-04 NOTE — Telephone Encounter (Signed)
Patient aware that she needs to be seen. Appt made for 06/14 ?

## 2021-10-04 NOTE — Telephone Encounter (Signed)
PA unable to completed due to [name] not seeing patient for migraines in over a year.  ?

## 2021-10-27 ENCOUNTER — Ambulatory Visit: Payer: Medicaid Other | Admitting: Nurse Practitioner

## 2021-10-27 ENCOUNTER — Encounter: Payer: Self-pay | Admitting: Nurse Practitioner

## 2021-10-27 VITALS — BP 145/88 | HR 102 | Temp 97.8°F | Resp 20 | Ht 68.0 in | Wt 190.0 lb

## 2021-10-27 DIAGNOSIS — J40 Bronchitis, not specified as acute or chronic: Secondary | ICD-10-CM | POA: Diagnosis not present

## 2021-10-27 MED ORDER — PREDNISONE 20 MG PO TABS: 40.0000 mg | ORAL_TABLET | Freq: Every day | ORAL | 0 refills | Status: AC

## 2021-10-27 MED ORDER — AZITHROMYCIN 250 MG PO TABS: ORAL_TABLET | ORAL | 0 refills | Status: DC

## 2021-10-27 NOTE — Progress Notes (Signed)
   Subjective:    Patient ID: Pam Chen, female    DOB: [birth year], 51 y.o.   MRN: [id number]   Chief Complaint: Cough and Nasal Congestion   Cough This is a new problem. The current episode started 1 to 4 weeks ago. The problem has been gradually worsening. The problem occurs constantly. The cough is Productive of purulent sputum. Associated symptoms include rhinorrhea. Pertinent negatives include no chills, ear congestion, ear pain, heartburn, sore throat, shortness of breath or wheezing. Nothing aggravates the symptoms. She has tried nothing for the symptoms. The treatment provided no relief. Her past medical history is significant for bronchitis.   Stopped smoking when cough started   Review of Systems  Constitutional:  Negative for chills.  HENT:  Positive for congestion and rhinorrhea. Negative for ear pain and sore throat.   Respiratory:  Positive for cough. Negative for shortness of breath, wheezing and stridor.   Gastrointestinal:  Negative for heartburn.  All other systems reviewed and are negative.     Objective:   Physical Exam Vitals reviewed.  HENT:     Right Ear: Tympanic membrane normal.     Left Ear: Tympanic membrane normal.     Nose: Nose normal.     Mouth/Throat:     Mouth: Mucous membranes are moist.  Cardiovascular:     Rate and Rhythm: Normal rate and regular rhythm.     Heart sounds: Normal heart sounds.  Pulmonary:     Effort: Pulmonary effort is normal.     Breath sounds: Normal breath sounds. No wheezing or rales.     Comments: deep dry cough Skin:    General: Skin is warm.  Neurological:     General: No focal deficit present.     Mental Status: She is oriented to person, place, and time.  Psychiatric:        Mood and Affect: Mood normal.        Behavior: Behavior normal.   BP (!) 145/88   Pulse (!) 102   Temp 97.8 F (36.6 C) (Temporal)   Resp 20   Ht 5\' 8"  (1.727 m)   Wt 190 lb (86.2 kg)   SpO2 95%   BMI 28.89 kg/m          Assessment & Plan:   Pam Chen in today with chief complaint of Cough and Nasal Congestion   1. Bronchitis Force fluids Rest Do not start smoking again - azithromycin (ZITHROMAX Z-PAK) 250 MG tablet; As directed  Dispense: 6 tablet; Refill: 0 - predniSONE (DELTASONE) 20 MG tablet; Take 2 tablets (40 mg total) by mouth daily with breakfast for 5 days. 2 po daily for 5 days  Dispense: 10 tablet; Refill: 0    The above assessment and management plan was discussed with the patient. The patient verbalized understanding of and has agreed to the management plan. Patient is aware to call the clinic if symptoms persist or worsen. Patient is aware when to return to the clinic for a follow-up visit. Patient educated on when it is appropriate to go to the emergency department.   Mary-Margaret Wyline Beady, FNP

## 2021-10-27 NOTE — Patient Instructions (Signed)

## 2021-10-28 ENCOUNTER — Other Ambulatory Visit: Payer: Self-pay

## 2021-10-28 ENCOUNTER — Telehealth: Payer: Self-pay | Admitting: Nurse Practitioner

## 2021-10-28 DIAGNOSIS — J40 Bronchitis, not specified as acute or chronic: Secondary | ICD-10-CM

## 2021-10-28 MED ORDER — ALBUTEROL SULFATE HFA 108 (90 BASE) MCG/ACT IN AERS: INHALATION_SPRAY | RESPIRATORY_TRACT | 1 refills | Status: DC

## 2021-10-28 MED ORDER — FLUTICASONE-SALMETEROL 250-50 MCG/ACT IN AEPB: 1.0000 | INHALATION_SPRAY | Freq: Two times a day (BID) | RESPIRATORY_TRACT | 2 refills | Status: DC

## 2021-10-28 NOTE — Telephone Encounter (Signed)
Inhalers sent to pharmacy and left detailed message on patients voicemail

## 2021-10-28 NOTE — Telephone Encounter (Signed)
Pt called stating that she had visit with MMM yesterday and thought [facility]MM was going to send in Albuterol and Advair for her, but pharmacy says they did not receive those.  Wants [facility] to send Rx's to [facility] [place].

## 2021-11-01 ENCOUNTER — Ambulatory Visit: Payer: Medicaid Other | Admitting: Orthopedic Surgery

## 2021-11-08 ENCOUNTER — Encounter: Payer: Self-pay | Admitting: Orthopedic Surgery

## 2021-11-08 ENCOUNTER — Ambulatory Visit (INDEPENDENT_AMBULATORY_CARE_PROVIDER_SITE_OTHER): Payer: Medicaid Other | Admitting: Orthopedic Surgery

## 2021-11-08 VITALS — Ht 68.0 in | Wt 190.0 lb

## 2021-11-08 DIAGNOSIS — M19012 Primary osteoarthritis, left shoulder: Secondary | ICD-10-CM

## 2021-11-08 DIAGNOSIS — Z01818 Encounter for other preprocedural examination: Secondary | ICD-10-CM

## 2021-11-08 NOTE — Progress Notes (Signed)
Orthopaedic Clinic Return  Assessment: Pam Chen is a 51 y.o. female with the following: Left shoulder AC joint arthritis  Plan: Pam Chen continues to have left shoulder pain.  It is progressively worsening.  Radiographs and MRI demonstrates advanced degenerative changes of the left AC joint.  No additional pathology is noted.  She does not have a rotator cuff tear.  The long head of the biceps tendon is intact.  She has responded well to injections of the left AC joint.  At this point, the pain is bothering her enough, that she would like to proceed with surgery.  We discussed the plan, including left shoulder arthroscopy, debridement and distal clavicle excision.  She is to stop vaping prior to surgery.  She also will obtain medical clearance, and has a scheduled appoint with Dr. Louanne Skye tomorrow.  Risks and benefits of the surgery, including, but not limited to infection, bleeding, persistent pain, need for further surgery, and more severe complications associated with anesthesia were discussed with the patient.  The patient has elected to proceed.  Surgery will tentatively be scheduled for December 08, 2021.   Follow-up: Return for After surgery; DOS December 08, 2021, 10 days postop .   Subjective:  Chief Complaint  Patient presents with   Shoulder Pain    Lt shoulder pain, here to discuss surgery.     History of Present Illness: Pam Chen is a 51 y.o. female who returns to clinic for repeat evaluation of her left shoulder.  I have seen her in clinic multiple times for this complaint.  She has had multiple injections in the left [facility] joint.  MRI demonstrated advanced degenerative changes, without further pathology.  She continues to have pain.  She has difficulty with overhead motion.  She has problems reaching across her body.  Unfortunately, she has been out of work for couple of months, but states that she is now interested in surgery.   Review of Systems: No fevers or  chills No numbness or tingling No chest pain No shortness of breath No bowel or bladder dysfunction No GI distress No headaches   Objective: Ht 5\' 8"  (1.727 m)   Wt 190 lb (86.2 kg)   BMI 28.89 kg/m   Physical Exam:  Alert and oriented.  No acute distress.  Left shoulder without bruising.  No swelling.  Tenderness palpation of the AC joint.  Forward flexion limited to 90 degrees.  She has pain with cross body adduction.  Fingers are warm and well-perfused.  Sensation is intact throughout the left hand.  2+ radial pulse.  IMAGING: I personally ordered and reviewed the following images:  No new imaging obtained today.   , MD 11/08/2021 4:01 PM *

## 2021-11-08 NOTE — H&P (View-Only) (Signed)
Orthopaedic Clinic Return  Assessment: Pam Chen is a 51 y.o. female with the following: Left shoulder AC joint arthritis  Plan: [name] continues to have left shoulder pain.  It is progressively worsening.  Radiographs and MRI demonstrates advanced degenerative changes of the left AC joint.  No additional pathology is noted.  She does not have a rotator cuff tear.  The long head of the biceps tendon is intact.  She has responded well to injections of the left AC joint.  At this point, the pain is bothering her enough, that she would like to proceed with surgery.  We discussed the plan, including left shoulder arthroscopy, debridement and distal clavicle excision.  She is to stop vaping prior to surgery.  She also will obtain medical clearance, and has a scheduled appoint with Dr. [name] tomorrow.  Risks and benefits of the surgery, including, but not limited to infection, bleeding, persistent pain, need for further surgery, and more severe complications associated with anesthesia were discussed with the patient.  The patient has elected to proceed.  Surgery will tentatively be scheduled for December 08, 2021.   Follow-up: Return for After surgery; DOS December 08, 2021, 10 days postop .   Subjective:  Chief Complaint  Patient presents with   Shoulder Pain    Lt shoulder pain, here to discuss surgery.     History of Present Illness: Pam Chen is a 51 y.o. female who returns to clinic for repeat evaluation of her left shoulder.  I have seen her in clinic multiple times for this complaint.  She has had multiple injections in the left AC joint.  MRI demonstrated advanced degenerative changes, without further pathology.  She continues to have pain.  She has difficulty with overhead motion.  She has problems reaching across her body.  Unfortunately, she has been out of work for couple of months, but states that she is now interested in surgery.   Review of Systems: No fevers or  chills No numbness or tingling No chest pain No shortness of breath No bowel or bladder dysfunction No GI distress No headaches   Objective: Ht 5' 8" (1.727 m)   Wt 190 lb (86.2 kg)   BMI 28.89 kg/m   Physical Exam:  Alert and oriented.  No acute distress.  Left shoulder without bruising.  No swelling.  Tenderness palpation of the AC joint.  Forward flexion limited to 90 degrees.  She has pain with cross body adduction.  Fingers are warm and well-perfused.  Sensation is intact throughout the left hand.  2+ radial pulse.  IMAGING: I personally ordered and reviewed the following images:  No new imaging obtained today.   Ellyce Lafevers A Trachelle Low, MD 11/08/2021 4:01 PM * 

## 2021-11-09 ENCOUNTER — Encounter: Payer: Self-pay | Admitting: Family Medicine

## 2021-11-09 ENCOUNTER — Ambulatory Visit (INDEPENDENT_AMBULATORY_CARE_PROVIDER_SITE_OTHER): Payer: Medicaid Other | Admitting: Family Medicine

## 2021-11-09 VITALS — BP 129/85 | HR 70 | Temp 98.0°F | Ht 68.0 in | Wt 186.0 lb

## 2021-11-09 DIAGNOSIS — E785 Hyperlipidemia, unspecified: Secondary | ICD-10-CM | POA: Diagnosis not present

## 2021-11-09 DIAGNOSIS — K219 Gastro-esophageal reflux disease without esophagitis: Secondary | ICD-10-CM

## 2021-11-09 DIAGNOSIS — F410 Panic disorder [episodic paroxysmal anxiety] without agoraphobia: Secondary | ICD-10-CM

## 2021-11-09 DIAGNOSIS — K5900 Constipation, unspecified: Secondary | ICD-10-CM

## 2021-11-09 DIAGNOSIS — Z01818 Encounter for other preprocedural examination: Secondary | ICD-10-CM

## 2021-11-09 DIAGNOSIS — F411 Generalized anxiety disorder: Secondary | ICD-10-CM

## 2021-11-09 DIAGNOSIS — E039 Hypothyroidism, unspecified: Secondary | ICD-10-CM

## 2021-11-09 DIAGNOSIS — G43909 Migraine, unspecified, not intractable, without status migrainosus: Secondary | ICD-10-CM

## 2021-11-09 DIAGNOSIS — F319 Bipolar disorder, unspecified: Secondary | ICD-10-CM

## 2021-11-09 LAB — COAGUCHEK XS/INR WAIVED
INR: 1 (ref 0.9–1.1)
Prothrombin Time: 12.6 s

## 2021-11-09 MED ORDER — LINACLOTIDE 290 MCG PO CAPS: 290.0000 ug | ORAL_CAPSULE | Freq: Every day | ORAL | 3 refills | Status: DC

## 2021-11-09 MED ORDER — BUSPIRONE HCL 7.5 MG PO TABS: 15.0000 mg | ORAL_TABLET | Freq: Two times a day (BID) | ORAL | 1 refills | Status: DC

## 2021-11-09 MED ORDER — FLUOXETINE HCL 20 MG PO CAPS: 60.0000 mg | ORAL_CAPSULE | Freq: Every day | ORAL | 1 refills | Status: DC

## 2021-11-09 MED ORDER — RIZATRIPTAN BENZOATE 10 MG PO TBDP: 10.0000 mg | ORAL_TABLET | ORAL | 1 refills | Status: DC | PRN

## 2021-11-09 MED ORDER — MIRABEGRON ER 25 MG PO TB24: 25.0000 mg | ORAL_TABLET | Freq: Every day | ORAL | 5 refills | Status: DC

## 2021-11-09 NOTE — Progress Notes (Signed)
BP 129/85   Pulse 70   Temp 98 F (36.7 C)   Ht 5\' 8"  (1.727 m)   Wt 186 lb (84.4 kg)   SpO2 99%   BMI 28.28 kg/m    Subjective:   Patient ID: Pam Chen, female    DOB: 1971-01-26, 51 y.o.   MRN: [id number]  HPI: Pam Chen is a 51 y.o. female presenting on 11/09/2021 for surgical clearance (Left shoulder/ Amedeo Kinsman)   HPI Hypothyroidism recheck Patient is coming in for thyroid recheck today as well. They deny any issues with hair changes or heat or cold problems or diarrhea or constipation. They deny any chest pain or palpitations. They are currently on no medication and has been subclinical, will recheck today  Hyperlipidemia Patient is coming in for recheck of his hyperlipidemia. The patient is currently taking no medicine currently, trying diet control. They deny any issues with myalgias or history of liver damage from it. They deny any focal numbness or weakness or chest pain.   GERD Patient is currently on none currently, has more constipation issues, her reflux has been doing better.  She denies any major symptoms or abdominal pain or belching or burping. She denies any blood in her stool or lightheadedness or dizziness.   Preop clearance Patient is coming in for preoperative physical clearance.  She is going to get an arthroscopic procedure for her left shoulder bursitis with Dr. Amedeo Kinsman  Migraine recheck Patient is coming in for migraine recheck.  She sees neurology for this and wants [facility] and she will go to them for that but is wondering if she can get a refill of the Maxalt fluoresce.  Chronic constipation Patient takes Linzess but she does admit she only takes it as needed and as needed is not working, recommended that she take it on a regular basis because the medicines recommend to work more that direction.  She denies any abdominal pain or nausea or vomiting.  She says she still gets constipated and backed up.   Overactive bladder Patient has overactive  bladder and takes Myrbetriq and she says it does help and she has been doing okay on it and is going to where she has not taken consistently more than as needed.  Relevant past medical, surgical, family and social history reviewed and updated as indicated. Interim medical history since our last visit reviewed. Allergies and medications reviewed and updated.  Review of Systems  Constitutional:  Negative for chills and fever.  Eyes:  Negative for redness and visual disturbance.  Respiratory:  Negative for chest tightness and shortness of breath.   Cardiovascular:  Negative for chest pain and leg swelling.  Musculoskeletal:  Positive for arthralgias. Negative for back pain and gait problem.  Skin:  Negative for rash.  Neurological:  Negative for dizziness, light-headedness and headaches.  Psychiatric/Behavioral:  Negative for agitation and behavioral problems.   All other systems reviewed and are negative.   Per HPI unless specifically indicated above   Allergies as of 11/09/2021       Reactions   Aspirin Other (See Comments)   Upset tummy.  Can take ibuprofen.   Oxycodone-acetaminophen Anxiety   Can take vicodin   Tramadol Hcl Nausea Only        Medication List        Accurate as of November 09, 2021  2:06 PM. If you have any questions, ask your nurse or doctor.          STOP taking these medications  azithromycin 250 MG tablet Commonly known as: Zithromax Z-Pak Stopped by: [name] Nilam Quakenbush, MD       TAKE these medications    albuterol 108 (90 Base) MCG/ACT inhaler Commonly known as: VENTOLIN HFA TAKE 1-2 PUFFS AS NEEDED FOR WHEEZING/CHEST TIGHTNESS   ARIPiprazole 30 MG tablet Commonly known as: ABILIFY Take 30 mg by mouth daily.   busPIRone 7.5 MG tablet Commonly known as: BUSPAR Take 2 tablets (15 mg total) by mouth 2 (two) times daily.   cyclobenzaprine 10 MG tablet Commonly known as: FLEXERIL Take 1 tablet (10 mg total) by mouth 3 (three) times  daily as needed for muscle spasms.   Emgality 120 MG/ML Soaj Generic drug: Galcanezumab-gnlm Inject 120 mg into the skin every 30 (thirty) days.   FLUoxetine 20 MG capsule Commonly known as: PROZAC Take 3 capsules (60 mg total) by mouth daily.   Fluticasone-Salmeterol 250-50 MCG/DOSE Aepb Commonly known as: ADVAIR Inhale 1 puff into the lungs every 12 (twelve) hours.   fluticasone-salmeterol 250-50 MCG/ACT Aepb Commonly known as: Advair Diskus Inhale 1 puff into the lungs in the morning and at bedtime.   ibuprofen 800 MG tablet Commonly known as: ADVIL TAKE 1 TABLET BY MOUTH EVERY 8 HOURS AS NEEDED   linaclotide 290 MCG Caps capsule Commonly known as: Linzess Take 1 capsule (290 mcg total) by mouth daily before breakfast. What changed: See the new instructions. Changed by: Nils Pyle, MD   mirabegron ER 25 MG Tb24 tablet Commonly known as: Myrbetriq Take 1 tablet (25 mg total) by mouth daily.   multivitamin with minerals Tabs tablet Take 1 tablet by mouth daily.   ondansetron 4 MG disintegrating tablet Commonly known as: ZOFRAN-ODT Take 1 tablet (4 mg total) by mouth every 8 (eight) hours as needed.   rizatriptan 10 MG disintegrating tablet Commonly known as: Maxalt-MLT Take 1 tablet (10 mg total) by mouth as needed. May repeat in 2 hours if needed         Objective:   BP 129/85   Pulse 70   Temp 98 F (36.7 C)   Ht 5\' 8"  (1.727 m)   Wt 186 lb (84.4 kg)   SpO2 99%   BMI 28.28 kg/m   Wt Readings from Last 3 Encounters:  11/09/21 186 lb (84.4 kg)  11/08/21 190 lb (86.2 kg)  10/27/21 190 lb (86.2 kg)    Physical Exam Vitals and nursing note reviewed.  Constitutional:      General: She is not in acute distress.    Appearance: She is well-developed. She is not diaphoretic.  Eyes:     Conjunctiva/sclera: Conjunctivae normal.  Cardiovascular:     Rate and Rhythm: Normal rate and regular rhythm.     Heart sounds: Normal heart sounds. No murmur  heard. Pulmonary:     Effort: Pulmonary effort is normal. No respiratory distress.     Breath sounds: Normal breath sounds. No wheezing.  Skin:    General: Skin is warm and dry.     Findings: No rash.  Neurological:     Mental Status: She is alert and oriented to person, place, and time.     Coordination: Coordination normal.  Psychiatric:        Behavior: Behavior normal.     EKG is normal sinus rhythm with a heart rate in the 73 range  Assessment & Plan:   Problem List Items Addressed This Visit       Cardiovascular and Mediastinum   Migraine  Relevant Medications   FLUoxetine (PROZAC) 20 MG capsule   rizatriptan (MAXALT-MLT) 10 MG disintegrating tablet     Digestive   GERD (gastroesophageal reflux disease)   Relevant Medications   linaclotide (LINZESS) 290 MCG CAPS capsule     Endocrine   Hypothyroidism     Other   Hyperlipidemia LDL goal <130   Bipolar 1 disorder, depressed (HCC)   Relevant Medications   busPIRone (BUSPAR) 7.5 MG tablet   Constipation   Relevant Medications   linaclotide (LINZESS) 290 MCG CAPS capsule   Other Visit Diagnoses     Preoperative clearance    -  Primary   Relevant Orders   EKG 12-Lead (Completed)   CBC with Differential/Platelet   CoaguChek XS/INR Waived   Panic attack       Relevant Medications   FLUoxetine (PROZAC) 20 MG capsule   busPIRone (BUSPAR) 7.5 MG tablet   GAD (generalized anxiety disorder)       Relevant Medications   FLUoxetine (PROZAC) 20 MG capsule   busPIRone (BUSPAR) 7.5 MG tablet       Patient is doing well, will check some blood work today and as long as blood work looks good we will clear for her operation.   Follow up plan: Return in about 6 months (around 05/11/2022), or if symptoms worsen or fail to improve, for Thyroid and constipation and anxiety recheck.  Counseling provided for all of the vaccine components Orders Placed This Encounter  Procedures   CBC with Differential/Platelet    CoaguChek XS/INR Waived   EKG 12-Lead    Caryl Pina, MD [facility] 11/09/2021, 2:06 PM

## 2021-11-10 LAB — CBC WITH DIFFERENTIAL/PLATELET
Basophils Absolute: 0 10*3/uL (ref 0.0–0.2)
Basos: 1 %
EOS (ABSOLUTE): 0.1 10*3/uL (ref 0.0–0.4)
Eos: 1 %
Hematocrit: 46.8 % — ABNORMAL HIGH (ref 34.0–46.6)
Hemoglobin: 16.2 g/dL — ABNORMAL HIGH (ref 11.1–15.9)
Immature Grans (Abs): 0 10*3/uL (ref 0.0–0.1)
Immature Granulocytes: 0 %
Lymphocytes Absolute: 1.7 10*3/uL (ref 0.7–3.1)
Lymphs: 27 %
MCH: 30.5 pg (ref 26.6–33.0)
MCHC: 34.6 g/dL (ref 31.5–35.7)
MCV: 88 fL (ref 79–97)
Monocytes Absolute: 0.4 10*3/uL (ref 0.1–0.9)
Monocytes: 7 %
Neutrophils Absolute: 4.1 10*3/uL (ref 1.4–7.0)
Neutrophils: 64 %
Platelets: 263 10*3/uL (ref 150–450)
RBC: 5.31 x10E6/uL — ABNORMAL HIGH (ref 3.77–5.28)
RDW: 12.3 % (ref 11.7–15.4)
WBC: 6.4 10*3/uL (ref 3.4–10.8)

## 2021-11-11 ENCOUNTER — Telehealth: Payer: Self-pay

## 2021-11-11 DIAGNOSIS — N3281 Overactive bladder: Secondary | ICD-10-CM

## 2021-11-11 NOTE — Telephone Encounter (Signed)
Myrbetriq is not covered under the pts ins plan.  They will cover:  Oxybutynin chloride  Oxybutynin  chloride ER  Solifenacin Succinate  This message can wait until [name] returns on Monday.

## 2021-11-14 NOTE — Telephone Encounter (Signed)
Please let the patient know that the Myrbetriq is not covered and we can send for oxybutynin, I do think she tried it in the past and cannot recall if it worked for her or not, please ask her this and then let me know and we can send 1 of these other options.

## 2021-11-15 ENCOUNTER — Telehealth: Payer: Self-pay | Admitting: Orthopedic Surgery

## 2021-11-15 NOTE — Telephone Encounter (Signed)
Pt has been informed. She fine trying Oxybutynin. Please send in new Rx to [facility] in [place].  Also informed pt that we are working on a PA for Maxalt and we have left a message with Dr. Genevieve Norlander office for a pre op clearance form for her shoulder surgery.

## 2021-11-15 NOTE — Telephone Encounter (Signed)
Voice message received from Domino at patient's primary care at [facility], asking about clearance for surgery-said 'saw no preop clearance form' from our office. Please fax to the following Fax#[phone]. If questions, call ph#[phone]

## 2021-11-16 MED ORDER — OXYBUTYNIN CHLORIDE ER 10 MG PO TB24: 10.0000 mg | ORAL_TABLET | Freq: Every day | ORAL | 3 refills | Status: DC

## 2021-11-16 NOTE — Telephone Encounter (Signed)
Pt is aware.  

## 2021-11-16 NOTE — Addendum Note (Signed)
Addended by: Arville Care on: 11/16/2021 09:58 AM   Modules accepted: Orders

## 2021-11-16 NOTE — Telephone Encounter (Signed)
Sent oxybutynin for the patient.

## 2021-11-16 NOTE — Telephone Encounter (Signed)
Letter sent.

## 2021-11-18 ENCOUNTER — Telehealth: Payer: Self-pay | Admitting: Family Medicine

## 2021-11-21 ENCOUNTER — Telehealth: Payer: Self-pay

## 2021-11-21 NOTE — Telephone Encounter (Signed)
[name],  Can this be taken OTC?

## 2021-11-23 ENCOUNTER — Encounter: Payer: Self-pay | Admitting: Nurse Practitioner

## 2021-11-23 ENCOUNTER — Ambulatory Visit: Payer: Medicaid Other | Admitting: Nurse Practitioner

## 2021-11-23 ENCOUNTER — Encounter: Payer: Self-pay | Admitting: Family Medicine

## 2021-12-05 NOTE — Patient Instructions (Signed)
Pam Chen  12/05/2021     @PREFPERIOPPHARMACY @   Your procedure is scheduled on  12/08/2021.   Report to [facility] at  1025  A.M.   Call this number if you have problems the morning of surgery:  [phone]   Remember:  Do not eat after midnight.   You may drink clear liquids until 0815 am on 7/13/202 .    Clear liquids allowed are:                    Water, Juice (non-citric and without pulp - diabetics please choose diet or no sugar options), Carbonated beverages - (diabetics please choose diet or no sugar options), Clear Tea, Black Coffee only (no creamer, milk or cream including half and half), Plain Jell-O only (diabetics please choose diet or no sugar options), Gatorade (diabetics please choose diet or no sugar options), and Plain Popsicles only     At 815 am on 12/08/2021, drink your carb drink. You can have noting else to drink after this.     Take these medicines the morning of surgery with A SIP OF WATER         abililfy, buspar, flexeril(if needed), prozac, zofan (if needed), maxalt(if needed).     Do not wear jewelry, make-up or nail polish.  Do not wear lotions, powders, or perfumes, or deodorant.  Do not shave 48 hours prior to surgery.  Men may shave face and neck.  Do not bring valuables to the hospital.  [facility] is not responsible for any belongings or valuables.  Contacts, dentures or bridgework may not be worn into surgery.  Leave your suitcase in the car.  After surgery it may be brought to your room.  For patients admitted to the hospital, discharge time will be determined by your treatment team.  Patients discharged the day of surgery will not be allowed to drive home and must  have someone with them for 24 hours.    Special instructions:   DO NOT smoke tobacco or vape for 24 hours before your procedure.  Please read over the following fact sheets that you were given. Coughing and Deep Breathing, Surgical Site Infection  Prevention, Anesthesia Post-op Instructions, and Care and Recovery After Surgery        Shoulder Arthroscopy, Care After The following information offers guidance on how to care for yourself after your procedure. Your health care provider may also give you more specific instructions. If you have problems or questions, contact your health care provider. What can I expect after the procedure? After the procedure, it is common to have: Pain. Swelling. A small amount of fluid from the incision. Stiffness that improves over time. Follow these instructions at home: If you have a sling or an immobilizer: Wear it as told by your health care provider. Remove it only as told by your health care provider. These devices protect your shoulder and help it heal by keeping it in place. Check the skin around it every day. Tell your health care provider about any concerns. Loosen it if your fingers tingle, become numb, or turn cold and blue. Keep the sling or immobilizer clean. If it is not waterproof: Do not let it get wet. Cover it with a watertight covering when you take a bath or a shower. Incision care  Follow instructions from your health care provider about how to take care of your incisions. Make sure you: Wash your  hands with soap and water for at least 20 seconds before and after you change your bandage (dressing). If soap and water are not available, use hand sanitizer. Change your dressing as told by your health care provider. Leave stitches (sutures), skin glue, or adhesive strips in place. These skin closures may need to stay in place for 2 weeks or longer. If adhesive strip edges start to loosen and curl up, you may trim the loose edges. Do not remove adhesive strips completely unless your health care provider tells you to do that. Check your incision areas every day for signs of infection. Check for: Redness. More swelling or pain. Blood or more fluid. Warmth. Pus or a bad smell. Do  not take baths, swim, or use a hot tub until your health care provider approves. Ask your health care provider if you may take showers. You may only be allowed to take sponge baths. Managing pain, stiffness, and swelling  If directed, put ice on the affected area. To do this: If you have a removable sling or immobilizer, remove it as told by your health care provider. Put ice in a plastic bag or use the icing device (cold therapy unit) that you were given. Follow instructions from your health care provider about how to use the icing device. Place a towel between your skin and the bag or between your skin and the icing device. Leave the ice on for 20 minutes, 2-3 times a day. Remove the ice if your skin turns bright red. This is very important. If you cannot feel pain, heat, or cold, you have a greater risk of damage to the area. Move your fingers often to reduce stiffness and swelling. Raise (elevate) the injured area above the level of your heart while you are lying down. It may help to sleep in a sitting position for a few days after your procedure. Try sleeping in a reclining chair or propping yourself up with extra pillows in bed. Activity Ask your health care provider what activities are safe for you during recovery. Do not lift with your affected shoulder until your health care provider approves. Avoid pulling and pushing with the arm on your affected side. If physical therapy was prescribed, do exercises as directed. Doing exercises may help to improve shoulder movement and flexibility (range of motion). Driving Ask your health care provider when it is safe to drive if you have a sling or immobilizer. Ask your health care provider if the medicine prescribed to you requires you to avoid driving or using machinery. General instructions Take over-the-counter and prescription medicines only as told by your health care provider. Ask your health care provider if the medicine prescribed to you  can cause constipation. You may need to take these actions to prevent or treat constipation: Drink enough fluid to keep your urine pale yellow. Take over-the-counter or prescription medicines. Eat foods that are high in fiber, such as beans, whole grains, and fresh fruits and vegetables. Limit foods that are high in fat and processed sugars, such as fried or sweet foods. Do not use any products that contain nicotine or tobacco. These products include cigarettes, chewing tobacco, and vaping devices, such as e-cigarettes. These can delay incision healing after surgery. If you need help quitting, ask your health care provider. Keep all follow-up visits. This is important. Contact a health care provider if: You have a fever or chills. You have severe pain. You have any of these signs of infection: Redness around an incision. More  swelling or pain in an incision area. Blood or more fluid coming from an incision. Warmth coming from an incision. Pus or a bad smell coming from an incision. You notice that an incision has opened up. You develop a rash. Get help right away if: You have difficulty breathing. You have chest pain. You notice that your fingers tingle, are numb, or are cold and blue even after you loosen your sling or immobilizer. You develop pain in your lower leg or at the back of your knee. These symptoms may represent a serious problem that is an emergency. Do not wait to see if the symptoms will go away. Get medical help right away. Call your local emergency services (911 in the [place] Do not drive yourself to the hospital. Summary If you have a sling or an immobilizer, wear it as told by your health care provider. It may help to sleep in a sitting position for a few days after your procedure. If physical therapy was prescribed, do exercises as directed. Doing exercises may help to improve shoulder movement and flexibility (range of motion). Keep all follow-up visits. This is  important. This information is not intended to replace advice given to you by your health care provider. Make sure you discuss any questions you have with your health care provider. Document Revised: 01/12/2020 Document Reviewed: 01/12/2020 Elsevier Patient Education  2023 [employer] General Anesthesia, Adult, Care After This sheet gives you information about how to care for yourself after your procedure. Your health care provider may also give you more specific instructions. If you have problems or questions, contact your health care provider. What can I expect after the procedure? After the procedure, the following side effects are common: Pain or discomfort at the IV site. Nausea. Vomiting. Sore throat. Trouble concentrating. Feeling cold or chills. Feeling weak or tired. Sleepiness and fatigue. Soreness and body aches. These side effects can affect parts of the body that were not involved in surgery. Follow these instructions at home: For the time period you were told by your health care provider:  Rest. Do not participate in activities where you could fall or become injured. Do not drive or use machinery. Do not drink alcohol. Do not take sleeping pills or medicines that cause drowsiness. Do not make important decisions or sign legal documents. Do not take care of children on your own. Eating and drinking Follow any instructions from your health care provider about eating or drinking restrictions. When you feel hungry, start by eating small amounts of foods that are soft and easy to digest (bland), such as toast. Gradually return to your regular diet. Drink enough fluid to keep your urine pale yellow. If you vomit, rehydrate by drinking water, juice, or clear broth. General instructions If you have sleep apnea, surgery and certain medicines can increase your risk for breathing problems. Follow instructions from your health care provider about wearing your sleep  device: Anytime you are sleeping, including during daytime naps. While taking prescription pain medicines, sleeping medicines, or medicines that make you drowsy. Have a responsible adult stay with you for the time you are told. It is important to have someone help care for you until you are awake and alert. Return to your normal activities as told by your health care provider. Ask your health care provider what activities are safe for you. Take over-the-counter and prescription medicines only as told by your health care provider. If you smoke, do not smoke without supervision. Keep all follow-up visits  as told by your health care provider. This is important. Contact a health care provider if: You have nausea or vomiting that does not get better with medicine. You cannot eat or drink without vomiting. You have pain that does not get better with medicine. You are unable to pass urine. You develop a skin rash. You have a fever. You have redness around your IV site that gets worse. Get help right away if: You have difficulty breathing. You have chest pain. You have blood in your urine or stool, or you vomit blood. Summary After the procedure, it is common to have a sore throat or nausea. It is also common to feel tired. Have a responsible adult stay with you for the time you are told. It is important to have someone help care for you until you are awake and alert. When you feel hungry, start by eating small amounts of foods that are soft and easy to digest (bland), such as toast. Gradually return to your regular diet. Drink enough fluid to keep your urine pale yellow. Return to your normal activities as told by your health care provider. Ask your health care provider what activities are safe for you. This information is not intended to replace advice given to you by your health care provider. Make sure you discuss any questions you have with your health care provider. Document Revised:  01/29/2020 Document Reviewed: 08/28/2019 Elsevier Patient Education  2023 Else[employer] Inc. How to Use Chlorhexidine for Bathing Chlorhexidine gluconate (CHG) is a germ-killing (antiseptic) solution that is used to clean the skin. It can get rid of the bacteria that normally live on the skin and can keep them away for about 24 hours. To clean your skin with CHG, you may be given: A CHG solution to use in the shower or as part of a sponge bath. A prepackaged cloth that contains CHG. Cleaning your skin with CHG may help lower the risk for infection: While you are staying in the intensive care unit of the hospital. If you have a vascular access, such as a central line, to provide short-term or long-term access to your veins. If you have a catheter to drain urine from your bladder. If you are on a ventilator. A ventilator is a machine that helps you breathe by moving air in and out of your lungs. After surgery. What are the risks? Risks of using CHG include: A skin reaction. Hearing loss, if CHG gets in your ears and you have a perforated eardrum. Eye injury, if CHG gets in your eyes and is not rinsed out. The CHG product catching fire. Make sure that you avoid smoking and flames after applying CHG to your skin. Do not use CHG: If you have a chlorhexidine allergy or have previously reacted to chlorhexidine. On babies younger than 8 months of age. How to use CHG solution Use CHG only as told by your health care provider, and follow the instructions on the label. Use the full amount of CHG as directed. Usually, this is one bottle. During a shower Follow these steps when using CHG solution during a shower (unless your health care provider gives you different instructions): Start the shower. Use your normal soap and shampoo to wash your face and hair. Turn off the shower or move out of the shower stream. Pour the CHG onto a clean washcloth. Do not use any type of brush or rough-edged  sponge. Starting at your neck, lather your body down to your toes. Make sure you  follow these instructions: If you will be having surgery, pay special attention to the part of your body where you will be having surgery. Scrub this area for at least 1 minute. Do not use CHG on your head or face. If the solution gets into your ears or eyes, rinse them well with water. Avoid your genital area. Avoid any areas of skin that have broken skin, cuts, or scrapes. Scrub your back and under your arms. Make sure to wash skin folds. Let the lather sit on your skin for 1-2 minutes or as long as told by your health care provider. Thoroughly rinse your entire body in the shower. Make sure that all body creases and crevices are rinsed well. Dry off with a clean towel. Do not put any substances on your body afterward--such as powder, lotion, or perfume--unless you are told to do so by your health care provider. Only use lotions that are recommended by the manufacturer. Put on clean clothes or pajamas. If it is the night before your surgery, sleep in clean sheets.  During a sponge bath Follow these steps when using CHG solution during a sponge bath (unless your health care provider gives you different instructions): Use your normal soap and shampoo to wash your face and hair. Pour the CHG onto a clean washcloth. Starting at your neck, lather your body down to your toes. Make sure you follow these instructions: If you will be having surgery, pay special attention to the part of your body where you will be having surgery. Scrub this area for at least 1 minute. Do not use CHG on your head or face. If the solution gets into your ears or eyes, rinse them well with water. Avoid your genital area. Avoid any areas of skin that have broken skin, cuts, or scrapes. Scrub your back and under your arms. Make sure to wash skin folds. Let the lather sit on your skin for 1-2 minutes or as long as told by your health care  provider. Using a different clean, wet washcloth, thoroughly rinse your entire body. Make sure that all body creases and crevices are rinsed well. Dry off with a clean towel. Do not put any substances on your body afterward--such as powder, lotion, or perfume--unless you are told to do so by your health care provider. Only use lotions that are recommended by the manufacturer. Put on clean clothes or pajamas. If it is the night before your surgery, sleep in clean sheets. How to use CHG prepackaged cloths Only use CHG cloths as told by your health care provider, and follow the instructions on the label. Use the CHG cloth on clean, dry skin. Do not use the CHG cloth on your head or face unless your health care provider tells you to. When washing with the CHG cloth: Avoid your genital area. Avoid any areas of skin that have broken skin, cuts, or scrapes. Before surgery Follow these steps when using a CHG cloth to clean before surgery (unless your health care provider gives you different instructions): Using the CHG cloth, vigorously scrub the part of your body where you will be having surgery. Scrub using a back-and-forth motion for 3 minutes. The area on your body should be completely wet with CHG when you are done scrubbing. Do not rinse. Discard the cloth and let the area air-dry. Do not put any substances on the area afterward, such as powder, lotion, or perfume. Put on clean clothes or pajamas. If it is the night before your  surgery, sleep in clean sheets.  For general bathing Follow these steps when using CHG cloths for general bathing (unless your health care provider gives you different instructions). Use a separate CHG cloth for each area of your body. Make sure you wash between any folds of skin and between your fingers and toes. Wash your body in the following order, switching to a new cloth after each step: The front of your neck, shoulders, and chest. Both of your arms, under your  arms, and your hands. Your stomach and groin area, avoiding the genitals. Your right leg and foot. Your left leg and foot. The back of your neck, your back, and your buttocks. Do not rinse. Discard the cloth and let the area air-dry. Do not put any substances on your body afterward--such as powder, lotion, or perfume--unless you are told to do so by your health care provider. Only use lotions that are recommended by the manufacturer. Put on clean clothes or pajamas. Contact a health care provider if: Your skin gets irritated after scrubbing. You have questions about using your solution or cloth. You swallow any chlorhexidine. Call your local poison control center (901-770-4138 in the [place] Get help right away if: Your eyes itch badly, or they become very red or swollen. Your skin itches badly and is red or swollen. Your hearing changes. You have trouble seeing. You have swelling or tingling in your mouth or throat. You have trouble breathing. These symptoms may represent a serious problem that is an emergency. Do not wait to see if the symptoms will go away. Get medical help right away. Call your local emergency services (911 in the [place] Do not drive yourself to the hospital. Summary Chlorhexidine gluconate (CHG) is a germ-killing (antiseptic) solution that is used to clean the skin. Cleaning your skin with CHG may help to lower your risk for infection. You may be given CHG to use for bathing. It may be in a bottle or in a prepackaged cloth to use on your skin. Carefully follow your health care provider's instructions and the instructions on the product label. Do not use CHG if you have a chlorhexidine allergy. Contact your health care provider if your skin gets irritated after scrubbing. This information is not intended to replace advice given to you by your health care provider. Make sure you discuss any questions you have with your health care provider. Document Revised: 07/26/2020  Document Reviewed: 07/26/2020 Elsevier Patient Education  2023 [employer].

## 2021-12-06 ENCOUNTER — Encounter (HOSPITAL_COMMUNITY): Payer: Self-pay

## 2021-12-06 ENCOUNTER — Encounter (HOSPITAL_COMMUNITY)
Admission: RE | Admit: 2021-12-06 | Discharge: 2021-12-06 | Disposition: A | Discharge status: Home / Self Care | Payer: Medicaid Other | Source: Ambulatory Visit | Attending: Orthopedic Surgery | Admitting: Orthopedic Surgery

## 2021-12-06 DIAGNOSIS — Z01812 Encounter for preprocedural laboratory examination: Secondary | ICD-10-CM | POA: Diagnosis not present

## 2021-12-06 DIAGNOSIS — Z01818 Encounter for other preprocedural examination: Secondary | ICD-10-CM

## 2021-12-06 HISTORY — DX: Bipolar disorder, unspecified: F31.9

## 2021-12-06 HISTORY — DX: Anxiety disorder, unspecified: F41.9

## 2021-12-06 HISTORY — DX: Depression, unspecified: F32.A

## 2021-12-06 LAB — BASIC METABOLIC PANEL
Anion gap: 6 (ref 5–15)
BUN: 11 mg/dL (ref 6–20)
CO2: 29 mmol/L (ref 22–32)
Calcium: 9.1 mg/dL (ref 8.9–10.3)
Chloride: 103 mmol/L (ref 98–111)
Creatinine, Ser: 0.93 mg/dL (ref 0.44–1.00)
GFR, Estimated: >60 mL/min (ref >60)
Glucose, Bld: 90 mg/dL (ref 70–99)
Potassium: 4.4 mmol/L (ref 3.5–5.1)
Sodium: 138 mmol/L (ref 135–145)

## 2021-12-06 LAB — CBC
HCT: 40.1 % (ref 36.0–46.0)
Hemoglobin: 13.2 g/dL (ref 12.0–15.0)
MCH: 29.9 pg (ref 26.0–34.0)
MCHC: 32.9 g/dL (ref 30.0–36.0)
MCV: 90.9 fL (ref 80.0–100.0)
Platelets: 250 10*3/uL (ref 150–400)
RBC: 4.41 MIL/uL (ref 3.87–5.11)
RDW: 12.9 % (ref 11.5–15.5)
WBC: 4.7 10*3/uL (ref 4.0–10.5)
nRBC: 0 % (ref 0.0–0.2)

## 2021-12-06 LAB — RAPID URINE DRUG SCREEN, HOSP PERFORMED
Amphetamines: NOT DETECTED
Barbiturates: NOT DETECTED
Benzodiazepines: NOT DETECTED
Cocaine: NOT DETECTED
Opiates: NOT DETECTED
Tetrahydrocannabinol: POSITIVE — AB

## 2021-12-08 ENCOUNTER — Other Ambulatory Visit: Payer: Self-pay

## 2021-12-08 ENCOUNTER — Ambulatory Visit (HOSPITAL_BASED_OUTPATIENT_CLINIC_OR_DEPARTMENT_OTHER): Payer: Medicaid Other

## 2021-12-08 ENCOUNTER — Ambulatory Visit (HOSPITAL_COMMUNITY)
Admission: RE | Admit: 2021-12-08 | Discharge: 2021-12-08 | Disposition: A | Discharge status: Home / Self Care | Payer: Medicaid Other | Attending: Orthopedic Surgery | Admitting: Orthopedic Surgery

## 2021-12-08 ENCOUNTER — Encounter (HOSPITAL_COMMUNITY): Payer: Self-pay | Admitting: Orthopedic Surgery

## 2021-12-08 ENCOUNTER — Encounter (HOSPITAL_COMMUNITY)
Admission: RE | Disposition: A | Discharge status: Home / Self Care | Payer: Self-pay | Source: Home / Self Care | Attending: Orthopedic Surgery

## 2021-12-08 ENCOUNTER — Ambulatory Visit (HOSPITAL_COMMUNITY): Payer: Medicaid Other

## 2021-12-08 DIAGNOSIS — F319 Bipolar disorder, unspecified: Secondary | ICD-10-CM | POA: Insufficient documentation

## 2021-12-08 DIAGNOSIS — I10 Essential (primary) hypertension: Secondary | ICD-10-CM | POA: Insufficient documentation

## 2021-12-08 DIAGNOSIS — G4730 Sleep apnea, unspecified: Secondary | ICD-10-CM | POA: Diagnosis not present

## 2021-12-08 DIAGNOSIS — M19012 Primary osteoarthritis, left shoulder: Secondary | ICD-10-CM | POA: Diagnosis not present

## 2021-12-08 DIAGNOSIS — M7552 Bursitis of left shoulder: Secondary | ICD-10-CM | POA: Diagnosis not present

## 2021-12-08 DIAGNOSIS — F17200 Nicotine dependence, unspecified, uncomplicated: Secondary | ICD-10-CM | POA: Diagnosis not present

## 2021-12-08 HISTORY — PX: SHOULDER ARTHROSCOPY WITH DISTAL CLAVICLE RESECTION: SHX5675

## 2021-12-08 SURGERY — SHOULDER ARTHROSCOPY WITH DISTAL CLAVICLE RESECTION
Anesthesia: Regional | Site: Shoulder | Laterality: Left

## 2021-12-08 MED ORDER — SODIUM CHLORIDE 0.9 % IR SOLN
Status: DC | PRN
  Administered 2021-12-08: 1000 mL

## 2021-12-08 MED ORDER — SODIUM CHLORIDE 0.9 % IR SOLN
Status: DC | PRN
  Administered 2021-12-08 (×6): 3000 mL

## 2021-12-08 MED ORDER — ACETAMINOPHEN 500 MG PO TABS: 1000.0000 mg | ORAL_TABLET | Freq: Three times a day (TID) | ORAL | 0 refills | Status: AC

## 2021-12-08 MED ORDER — ROCURONIUM BROMIDE 10 MG/ML (PF) SYRINGE
PREFILLED_SYRINGE | INTRAVENOUS | Status: AC
  Filled 2021-12-08: qty 10

## 2021-12-08 MED ORDER — ROCURONIUM BROMIDE 10 MG/ML (PF) SYRINGE
PREFILLED_SYRINGE | INTRAVENOUS | Status: DC | PRN
  Administered 2021-12-08: 70 mg via INTRAVENOUS

## 2021-12-08 MED ORDER — ROPIVACAINE HCL 5 MG/ML IJ SOLN
INTRAMUSCULAR | Status: AC
  Filled 2021-12-08: qty 30

## 2021-12-08 MED ORDER — EPHEDRINE SULFATE (PRESSORS) 50 MG/ML IJ SOLN
INTRAMUSCULAR | Status: DC | PRN
  Administered 2021-12-08: 10 mg via INTRAVENOUS

## 2021-12-08 MED ORDER — MIDAZOLAM HCL 2 MG/2ML IJ SOLN
INTRAMUSCULAR | Status: AC
  Filled 2021-12-08: qty 2

## 2021-12-08 MED ORDER — ROPIVACAINE HCL 5 MG/ML IJ SOLN
INTRAMUSCULAR | Status: DC | PRN
  Administered 2021-12-08: 20 mL

## 2021-12-08 MED ORDER — FENTANYL CITRATE (PF) 100 MCG/2ML IJ SOLN
INTRAMUSCULAR | Status: AC
  Filled 2021-12-08: qty 2

## 2021-12-08 MED ORDER — PROPOFOL 10 MG/ML IV BOLUS
INTRAVENOUS | Status: DC | PRN
  Administered 2021-12-08: 200 mg via INTRAVENOUS

## 2021-12-08 MED ORDER — FENTANYL CITRATE PF 50 MCG/ML IJ SOSY
25.0000 ug | PREFILLED_SYRINGE | INTRAMUSCULAR | Status: DC | PRN
  Administered 2021-12-08: 50 ug via INTRAVENOUS
  Filled 2021-12-08: qty 1

## 2021-12-08 MED ORDER — ONDANSETRON HCL 4 MG/2ML IJ SOLN
INTRAMUSCULAR | Status: AC
  Filled 2021-12-08: qty 2

## 2021-12-08 MED ORDER — CHLORHEXIDINE GLUCONATE 0.12 % MT SOLN
OROMUCOSAL | Status: AC
  Administered 2021-12-08: 15 mL via OROMUCOSAL
  Filled 2021-12-08: qty 15

## 2021-12-08 MED ORDER — CEFAZOLIN SODIUM-DEXTROSE 2-4 GM/100ML-% IV SOLN
INTRAVENOUS | Status: AC
  Filled 2021-12-08: qty 100

## 2021-12-08 MED ORDER — ONDANSETRON HCL 4 MG/2ML IJ SOLN
4.0000 mg | Freq: Once | INTRAMUSCULAR | Status: DC | PRN
Start: 2021-12-08 — End: 2021-12-08

## 2021-12-08 MED ORDER — EPINEPHRINE PF 1 MG/ML IJ SOLN
INTRAMUSCULAR | Status: AC
  Filled 2021-12-08: qty 6

## 2021-12-08 MED ORDER — FENTANYL CITRATE (PF) 100 MCG/2ML IJ SOLN
INTRAMUSCULAR | Status: DC | PRN
  Administered 2021-12-08 (×2): 50 ug via INTRAVENOUS

## 2021-12-08 MED ORDER — ORAL CARE MOUTH RINSE: 15.0000 mL | Freq: Once | OROMUCOSAL | Status: AC

## 2021-12-08 MED ORDER — EPHEDRINE 5 MG/ML INJ
INTRAVENOUS | Status: AC
  Filled 2021-12-08: qty 5

## 2021-12-08 MED ORDER — LACTATED RINGERS IV SOLN
INTRAVENOUS | Status: DC
Start: 2021-12-08 — End: 2021-12-08
  Administered 2021-12-08: 1000 mL via INTRAVENOUS

## 2021-12-08 MED ORDER — CHLORHEXIDINE GLUCONATE 0.12 % MT SOLN: 15.0000 mL | Freq: Once | OROMUCOSAL | Status: AC

## 2021-12-08 MED ORDER — HYDROCODONE-ACETAMINOPHEN 7.5-325 MG PO TABS: 1.0000 | ORAL_TABLET | Freq: Once | ORAL | Status: DC | PRN

## 2021-12-08 MED ORDER — SUGAMMADEX SODIUM 200 MG/2ML IV SOLN
INTRAVENOUS | Status: DC | PRN
  Administered 2021-12-08: 200 mg via INTRAVENOUS

## 2021-12-08 MED ORDER — CEFAZOLIN SODIUM-DEXTROSE 2-4 GM/100ML-% IV SOLN: 2.0000 g | INTRAVENOUS | Status: DC

## 2021-12-08 MED ORDER — IBUPROFEN 800 MG PO TABS: 800.0000 mg | ORAL_TABLET | Freq: Three times a day (TID) | ORAL | 0 refills | Status: AC

## 2021-12-08 MED ORDER — PHENYLEPHRINE HCL-NACL 20-0.9 MG/250ML-% IV SOLN
INTRAVENOUS | Status: AC
  Filled 2021-12-08: qty 250

## 2021-12-08 MED ORDER — PHENYLEPHRINE 80 MCG/ML (10ML) SYRINGE FOR IV PUSH (FOR BLOOD PRESSURE SUPPORT)
PREFILLED_SYRINGE | INTRAVENOUS | Status: AC
  Filled 2021-12-08: qty 10

## 2021-12-08 MED ORDER — LIDOCAINE HCL (PF) 2 % IJ SOLN
INTRAMUSCULAR | Status: AC
  Filled 2021-12-08: qty 5

## 2021-12-08 MED ORDER — ONDANSETRON HCL 4 MG PO TABS: 4.0000 mg | ORAL_TABLET | Freq: Three times a day (TID) | ORAL | 0 refills | Status: AC | PRN

## 2021-12-08 MED ORDER — DEXAMETHASONE SODIUM PHOSPHATE 10 MG/ML IJ SOLN
INTRAMUSCULAR | Status: AC
  Filled 2021-12-08: qty 1

## 2021-12-08 SURGICAL SUPPLY — 50 items
APL PRP STRL LF DISP 70% ISPRP (MISCELLANEOUS) ×1
BLADE EXCALIBUR 4.0X13 (MISCELLANEOUS) ×1 IMPLANT
BNDG GAUZE DERMACEA FLUFF (GAUZE/BANDAGES/DRESSINGS) ×2
BNDG GAUZE DERMACEA FLUFF 4 (GAUZE/BANDAGES/DRESSINGS) IMPLANT
BNDG GZE DERMACEA 4 6PLY (GAUZE/BANDAGES/DRESSINGS) ×2
BURR OVAL 8 FLU 4.0X13 (MISCELLANEOUS) ×1 IMPLANT
CHLORAPREP W/TINT 26 (MISCELLANEOUS) ×2 IMPLANT
CLOTH BEACON ORANGE TIMEOUT ST (SAFETY) ×2 IMPLANT
COVER LIGHT HANDLE STERIS (MISCELLANEOUS) ×4 IMPLANT
DRAPE SHOULDER BEACH CHAIR (DRAPES) ×2 IMPLANT
DRAPE U-SHAPE 47X51 STRL (DRAPES) ×2 IMPLANT
ELECT REM PT RETURN 9FT ADLT (ELECTROSURGICAL) ×2
ELECTRODE REM PT RTRN 9FT ADLT (ELECTROSURGICAL) ×1 IMPLANT
GAUZE SPONGE 4X4 12PLY STRL (GAUZE/BANDAGES/DRESSINGS) ×1 IMPLANT
GAUZE XEROFORM 1X8 LF (GAUZE/BANDAGES/DRESSINGS) ×1 IMPLANT
GLOVE BIOGEL PI IND STRL 7.0 (GLOVE) ×2 IMPLANT
GLOVE BIOGEL PI IND STRL 8 (GLOVE) IMPLANT
GLOVE BIOGEL PI INDICATOR 7.0 (GLOVE) ×3
GLOVE BIOGEL PI INDICATOR 8 (GLOVE) ×2
GLOVE ECLIPSE 7.0 STRL STRAW (GLOVE) ×1 IMPLANT
GLOVE SURG SS PI 8.0 STRL IVOR (GLOVE) ×1 IMPLANT
GOWN STRL REUS W/ TWL XL LVL3 (GOWN DISPOSABLE) ×1 IMPLANT
GOWN STRL REUS W/TWL LRG LVL3 (GOWN DISPOSABLE) ×4 IMPLANT
GOWN STRL REUS W/TWL XL LVL3 (GOWN DISPOSABLE) ×2
INST SET MINOR BONE (KITS) ×2 IMPLANT
IV NS IRRIG 3000ML ARTHROMATIC (IV SOLUTION) ×8 IMPLANT
KIT BLADEGUARD II DBL (SET/KITS/TRAYS/PACK) ×2 IMPLANT
KIT POSITION SHOULDER SCHLEI (MISCELLANEOUS) ×2 IMPLANT
KIT TURNOVER KIT A (KITS) ×2 IMPLANT
MANIFOLD NEPTUNE II (INSTRUMENTS) ×2 IMPLANT
MARKER SKIN DUAL TIP RULER LAB (MISCELLANEOUS) ×2 IMPLANT
NDL HYPO 21X1.5 SAFETY (NEEDLE) ×1 IMPLANT
NDL SPNL 18GX3.5 QUINCKE PK (NEEDLE) ×1 IMPLANT
NEEDLE HYPO 21X1.5 SAFETY (NEEDLE) ×2 IMPLANT
NEEDLE SPNL 18GX3.5 QUINCKE PK (NEEDLE) ×2 IMPLANT
NS IRRIG 1000ML POUR BTL (IV SOLUTION) ×2 IMPLANT
PACK TOTAL JOINT (CUSTOM PROCEDURE TRAY) ×2 IMPLANT
PAD ABD 5X9 TENDERSORB (GAUZE/BANDAGES/DRESSINGS) ×4 IMPLANT
PAD ARMBOARD 7.5X6 YLW CONV (MISCELLANEOUS) ×2 IMPLANT
SET ARTHROSCOPY INST (INSTRUMENTS) ×2 IMPLANT
SET BASIN LINEN APH (SET/KITS/TRAYS/PACK) ×2 IMPLANT
SET SHOULDER TRAC (MISCELLANEOUS) ×1 IMPLANT
SET SHOULDER TRACTION (MISCELLANEOUS) ×2
SLING ARM FOAM STRAP LRG (SOFTGOODS) ×1 IMPLANT
SUT ETHILON 3 0 FSL (SUTURE) ×1 IMPLANT
SYR BULB IRRIG 60ML STRL (SYRINGE) ×3 IMPLANT
TOWEL OR 17X26 4PK STRL BLUE (TOWEL DISPOSABLE) ×2 IMPLANT
TUBING IN/OUT FLOW W/MAIN PUMP (TUBING) ×2 IMPLANT
WAND 90 DEG TURBOVAC W/CORD (SURGICAL WAND) ×2 IMPLANT
YANKAUER SUCT BULB TIP 10FT TU (MISCELLANEOUS) ×1 IMPLANT

## 2021-12-08 NOTE — Discharge Instructions (Signed)
Pam Chen A. Dallas Schimke, MD MS [facility] [address] [place],  [place]  [address] Phone: [phone] Fax: [phone]    POST-OPERATIVE INSTRUCTIONS - SHOULDER ARTHROSCOPY  WOUND CARE You may remove the Operative Dressing on Post-Op Day #3 (72hrs after surgery).   Alternatively if you would like you can leave dressing on until follow-up if within 7-8 days but keep it dry. Leave steri-strips in place until they fall off on their own, usually 2 weeks postop. There may be a small amount of fluid/bleeding leaking at the surgical site. This is normal; the shoulder is filled with fluid during the procedure and can leak for 24-48hrs after surgery. ou may change/reinforce the bandage as needed.  Use the Cryocuff or Ice as often as possible for the first 7 days, then as needed for pain relief. Always keep a towel, ACE wrap or other barrier between the cooling unit and your skin.  You may shower on Post-Op Day #3. Gently pat the area dry. Do not soak the shoulder in water or submerge it. Keep dry incisions as dry as possible. Do not go swimming in the pool or ocean until 4 weeks after surgery or when otherwise instructed.    EXERCISES/BRACING Sling should be used at all times until follow-up.  You can remove sling for hygiene.    Please continue to ambulate and do not stay sitting or lying for too long. Perform foot and wrist pumps to assist in circulation.  POST-OP MEDICATIONS- Multimodal approach to pain control In general your pain will be controlled with a combination of substances.  Prescriptions unless otherwise discussed are electronically sent to your pharmacy.  This is a carefully made plan we use to minimize narcotic use.    Ibuprofen - Anti-inflammatory medication taken on a scheduled basis Acetaminophen - Non-narcotic pain medicine taken on a scheduled basis  Aspirin 81mg  - This medicine is used to minimize the risk of blood clots after surgery.  Please take  until you are fully mobile.  Zofran - take as needed for nausea Other: Continue Suboxone per your behavioral health specialist.   FOLLOW-UP If you develop a Fever (?101.5), Redness or Drainage from the surgical incision site, please call our office to arrange for an evaluation. Please call the office to schedule a follow-up appointment for your suture removal, 10-14 days post-operatively.    HELPFUL INFORMATION  If you had a block, it will wear off between 8-24 hrs postop typically.  This is period when your pain may go from nearly zero to the pain you would have had postop without the block.  This is an abrupt transition but nothing dangerous is happening.  You may take an extra dose of narcotic when this happens.  You may be more comfortable sleeping in a semi-seated position the first few nights following surgery.  Keep a pillow propped under the elbow and forearm for comfort.  If you have a recliner type of chair it might be beneficial.  If not that is fine too, but it would be helpful to sleep propped up with pillows behind your operated shoulder as well under your elbow and forearm.  This will reduce pulling on the suture lines.  When dressing, put your operative arm in the sleeve first.  When getting undressed, take your operative arm out last.  Loose fitting, button-down shirts are recommended.  Often in the first days after surgery you may be more comfortable keeping your operative arm under your shirt and  not through the sleeve.  You may return to work/school in the next couple of days when you feel up to it.  Desk work and typing in the sling is     fine.  We suggest you use the pain medication the first night prior to going to bed, in order to ease any pain when the anesthesia wears off. You should avoid taking pain medications on an empty stomach as it will make you nauseous.  You should wean off your narcotic medicines as soon as you are able.  Most patients will be off or using  minimal narcotics before their first postop appointment.   Do not drink alcoholic beverages or take illicit drugs when taking pain medications.  It is against the law to drive while taking narcotics.  In some states it is against the law to drive while your arm is in a sling.   Pain medication may make you constipated.  Below are a few solutions to try in this order: Decrease the amount of pain medication if you aren't having pain. Drink lots of decaffeinated fluids. Drink prune juice and/or eat dried prunes  If the first 3 don't work start with additional solutions Take Colace - an over-the-counter stool softener Take Senokot - an over-the-counter laxative Take Miralax - a stronger over-the-counter laxative

## 2021-12-08 NOTE — Anesthesia Procedure Notes (Signed)
Anesthesia Regional Block: Interscalene brachial plexus block   Pre-Anesthetic Checklist: , timeout performed,  Correct Patient, Correct Site, Correct Laterality,  Correct Procedure, Correct Position, site marked,  Risks and benefits discussed,  Surgical consent,  Pre-op evaluation,  At surgeon's request and post-op pain management  Laterality: Left  Prep: chloraprep       Needles:  Injection technique: Single-shot  Needle Type: Echogenic Stimulator Needle     Needle Length: 4cm  Needle Gauge: 22     Additional Needles:   Procedures:, nerve stimulator,,, ultrasound used (permanent image in chart),,     Nerve Stimulator or Paresthesia:  Response: Motor deltoid Response, 0.75 mA  Additional Responses:   Narrative:  Start time: 12/08/2021 1:42 PM End time: 12/08/2021 1:47 PM  Performed by: Other  CRNA: Bobbe Medico, RN  Additional Notes: US probe placed on supraclavicular region to identify brachial plexus lateral to subclavian artery. Scanned cephalad and followed brachial plexus between anterior and middle scalene. Nerve stimulator turned on to 0.47mA and needle inserted and viewed under US guidance, deltoid motor response noted at 0.42mA and lost at 0.25mA. Injected 0.5% ropi in 5 ml increments with negative aspiration before each injection to surround brachial plexus, 61mL total.

## 2021-12-08 NOTE — Interval H&P Note (Signed)
History and Physical Interval Note:  12/08/2021 2:02 PM  Pam Chen  has presented today for surgery, with the diagnosis of Left shoulder AC joint arthritis.  The various methods of treatment have been discussed with the patient and family. After consideration of risks, benefits and other options for treatment, the patient has consented to  Procedure(s): SHOULDER ARTHROSCOPY WITH DISTAL CLAVICLE RESECTION (Left) as a surgical intervention.  The patient's history has been reviewed, patient examined, no change in status, stable for surgery.  I have reviewed the patient's chart and labs.  Questions were answered to the patient's satisfaction.     Oliver Barre

## 2021-12-08 NOTE — Progress Notes (Signed)
Instructed on incentive spirometer. 2500 ml obtained. Tolerated well. 

## 2021-12-08 NOTE — Op Note (Signed)
Orthopaedic Surgery Operative Note (CSN: [id number])  Pam Chen  [birth year] Date of Surgery: 12/08/2021   Diagnoses:  Left shoulder Acromioclavicular joint arthritis  Procedure: Left shoulder arthroscopy Left shoulder distal clavicle excision, completed arthroscopically   Operative Finding Successful completion of the planned procedure.  Diagnostic left shoulder arthroscopy demonstrated no significant injuries.  Minimal debridement within the glenohumeral joint.  We completed a minimal subacromial decompression, then identified the [facility] joint.  This clavicle excision was completed without issues.  Post-Op Diagnosis: Same Surgeons:Primary: Oliver Barre, MD Assistants: Westly Pam Location: [facility] OR ROOM 4 Anesthesia: General with regional anesthesia Antibiotics: Ancef 2 g Tourniquet time: N/A Estimated Blood Loss: 25 cc Complications: None Specimens: None Implants: No implants  Indications for Surgery:   Pam Chen is a 51 y.o. female with persistent left shoulder pain.  Radiographs and MRI demonstrated advanced degenerative changes within the [facility] joint.  She had multiple injections within the [facility] joint, with excellent relief.  However, the relief of her pain was not sustained.  As result, we discussed possibility proceeding with surgery.  Benefits and risks of operative and nonoperative management were discussed prior to surgery with the patient and informed consent form was completed.  Specific risks including infection, need for additional surgery, bleeding, persistent pain, AC joint instability and more severe complications associate with anesthesia.  She elected to proceed.  Surgical consent was finalized.   Procedure:   The patient was identified properly. Informed consent was obtained and the surgical site was marked. The patient was taken to the OR where general anesthesia was induced.  The patient was positioned in beachchair position.  The left arm was prepped and draped in the  usual sterile fashion.  Timeout was performed before the beginning of the case.  We started by placing the camera within the glenohumeral joint.  Diagnostic arthroscopy was completed.  Subscapularis was intact.  Articular surfaces were intact.  There is a mild amount of fraying within the joint, which was gently debrided with a shaver.  The biceps tendon was intact, within the groove.  The articular surface of the rotator cuff was intact.  The arthroscopy was then introduced in the subacromial space.  We made a standard mid lateral portal, and proceeded to gently debride the subacromial bursitis.  Overall, there is minimal bursitis.  We then used electrocautery to clear the undersurface of the acromion.  There was a small spike of bone, which was debrided with a bur.  We continued to debride the undersurface of the acromion to the [facility] joint.  We confirmed that we were at the [facility] joint, by gently palpating the distal clavicle.  We then made an anterior portal.  The [facility] joint soft tissue was then cleared using electrocautery.  We then introduced a bur, and proceeded to debride away a portion of the acromion, as well as the distal clavicle.  This was completed all the way through to the superior capsule, which remained intact.  We are able to palpate the joint from the outside.  We used a spinal needle that easily passed through the distal clavicle excision.  We also introduced the camera and we are able to evaluate the superior aspect of the distal clavicle.  Once were satisfied with the the extent of our debridement, the instruments and camera was removed.  We closed the incisions with 3-0 nylon.  Sterile dressing was placed followed by a sling and an ice machine.  The patient was awoken taken to PACU in stable  condition.    Post-operative plan:  The patient will be nonweightbearing on the left upper extremity.  Sling at all times except for hygiene. She will be discharged from the PACU when she has  sufficiently recovered DVT prophylaxis not indicated in this ambulatory upper extremity patient without significant risk factors.    Pain control with PRN pain medication preferring oral medicines.   Follow up plan will be scheduled in approximately 10-14 days for incision check and XR.

## 2021-12-08 NOTE — Anesthesia Procedure Notes (Signed)
Procedure Name: Intubation Date/Time: 12/08/2021 2:50 PM  Performed by: Karna Dupes, CRNAPre-anesthesia Checklist: Patient identified, Emergency Drugs available, Suction available and Patient being monitored Patient Re-evaluated:Patient Re-evaluated prior to induction Oxygen Delivery Method: Circle system utilized Preoxygenation: Pre-oxygenation with 100% oxygen Induction Type: IV induction Ventilation: Mask ventilation without difficulty Laryngoscope Size: Mac and 3 Grade View: Grade I Tube type: Oral Tube size: 7.0 mm Number of attempts: 1 Airway Equipment and Method: Stylet Placement Confirmation: ETT inserted through vocal cords under direct vision, positive ETCO2 and breath sounds checked- equal and bilateral Secured at: 21 cm Tube secured with: Tape Dental Injury: Teeth and Oropharynx as per pre-operative assessment

## 2021-12-08 NOTE — Transfer of Care (Signed)
Immediate Anesthesia Transfer of Care Note  Patient: Pam Chen  Procedure(s) Performed: SHOULDER ARTHROSCOPY WITH DISTAL CLAVICLE RESECTION (Left: Shoulder)  Patient Location: PACU  Anesthesia Type:General  Level of Consciousness: awake  Airway & Oxygen Therapy: Patient Spontanous Breathing  Post-op Assessment: Report given to RN  Post vital signs: Reviewed and stable  Last Vitals:  Vitals Value Taken Time  BP 140/67 12/08/21 1716  Temp    Pulse 79 12/08/21 1723  Resp 15 12/08/21 1723  SpO2 100 % 12/08/21 1723  Vitals shown include unvalidated device data.  Last Pain:  Vitals:   12/08/21 1210  PainSc: 0-No pain         Complications: No notable events documented.

## 2021-12-08 NOTE — Anesthesia Preprocedure Evaluation (Signed)
Anesthesia Evaluation  Patient identified by MRN, date of birth, ID band Patient awake    Reviewed: Allergy & Precautions, NPO status , Patient's Chart, lab work & pertinent test results  Airway Mallampati: II  TM Distance: >3 FB Neck ROM: Full    Dental no notable dental hx.    Pulmonary sleep apnea , Current Smoker,    Pulmonary exam normal        Cardiovascular Exercise Tolerance: Good hypertension, negative cardio ROS Normal cardiovascular exam     Neuro/Psych  Headaches, Anxiety Depression Bipolar Disorder    GI/Hepatic Neg liver ROS, GERD  ,  Endo/Other  Hypothyroidism   Renal/GU negative Renal ROS     Musculoskeletal  (+) Arthritis ,   Abdominal Normal abdominal exam  (+)   Peds  Hematology negative hematology ROS (+)   Anesthesia Other Findings suboxone  Reproductive/Obstetrics                             Anesthesia Physical Anesthesia Plan  ASA: 2  Anesthesia Plan: General and Regional   Post-op Pain Management: Regional block*   Induction: Intravenous  PONV Risk Score and Plan: 1 and Ondansetron and Dexamethasone  Airway Management Planned: Oral ETT  Additional Equipment:   Intra-op Plan:   Post-operative Plan: Extubation in OR  Informed Consent: I have reviewed the patients History and Physical, chart, labs and discussed the procedure including the risks, benefits and alternatives for the proposed anesthesia with the patient or authorized representative who has indicated his/her understanding and acceptance.     Dental advisory given  Plan Discussed with: CRNA  Anesthesia Plan Comments:         Anesthesia Quick Evaluation

## 2021-12-09 NOTE — Anesthesia Postprocedure Evaluation (Signed)
Anesthesia Post Note  Patient: Pam Chen  Procedure(s) Performed: SHOULDER ARTHROSCOPY WITH DISTAL CLAVICLE RESECTION (Left: Shoulder)  Patient location during evaluation: Phase II Anesthesia Type: Regional Level of consciousness: awake and alert Pain management: pain level controlled Vital Signs Assessment: post-procedure vital signs reviewed and stable Respiratory status: spontaneous breathing, nonlabored ventilation, respiratory function stable and patient connected to nasal cannula oxygen Cardiovascular status: blood pressure returned to baseline and stable Postop Assessment: no apparent nausea or vomiting Anesthetic complications: no   There were no known notable events for this encounter.   Last Vitals:  Vitals:   12/08/21 1746 12/08/21 1759  BP: 137/79 134/78  Pulse: 66 74  Resp: 15 18  Temp:  36.9 C  SpO2: 97% 99%    Last Pain:  Vitals:   12/08/21 1759  TempSrc: Oral  PainSc: 4                  Glynis Smiles

## 2021-12-12 ENCOUNTER — Encounter (HOSPITAL_COMMUNITY): Payer: Self-pay | Admitting: Orthopedic Surgery

## 2021-12-13 IMAGING — MG MM DIGITAL SCREENING BILAT W/ TOMO AND CAD
8 series · 8 of 24 positions shown · non-contrast
Comparison: Previous exam(s).

CLINICAL DATA: Screening.

EXAM:
DIGITAL SCREENING BILATERAL MAMMOGRAM WITH TOMOSYNTHESIS AND CAD
TECHNIQUE: Bilateral screening digital craniocaudal and mediolateral oblique
mammograms were obtained. Bilateral screening digital breast
tomosynthesis was performed. The images were evaluated with
computer-aided detection.

[L CC synth-2D]
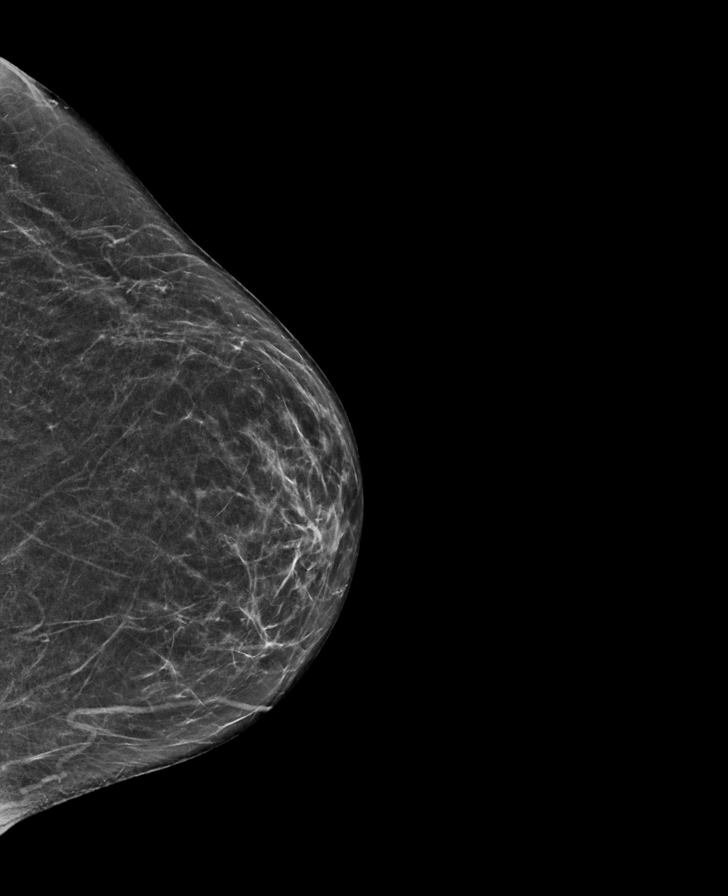

[L MLO synth-2D]
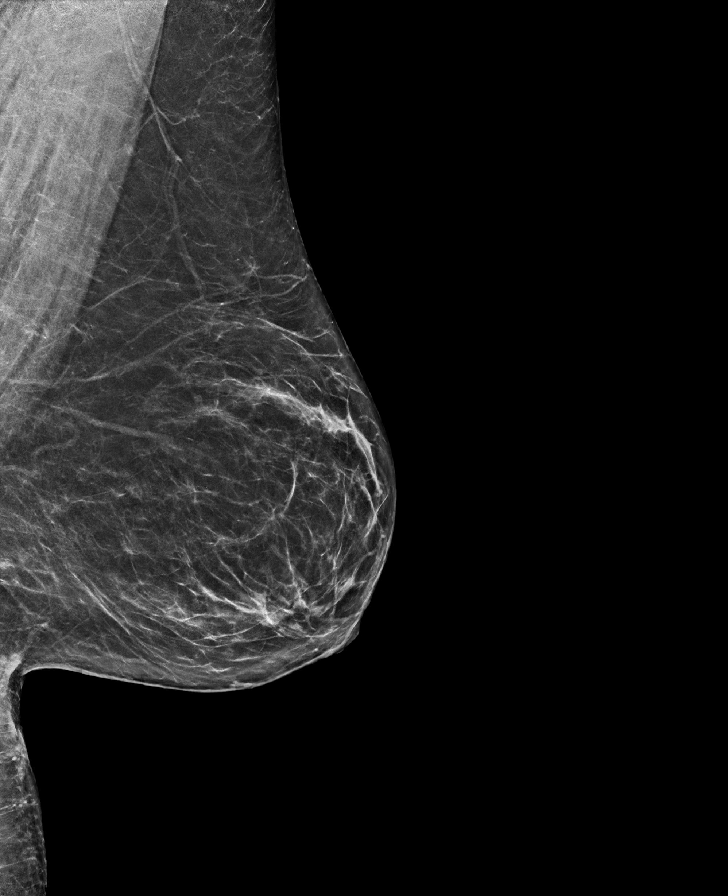

[R CC synth-2D]
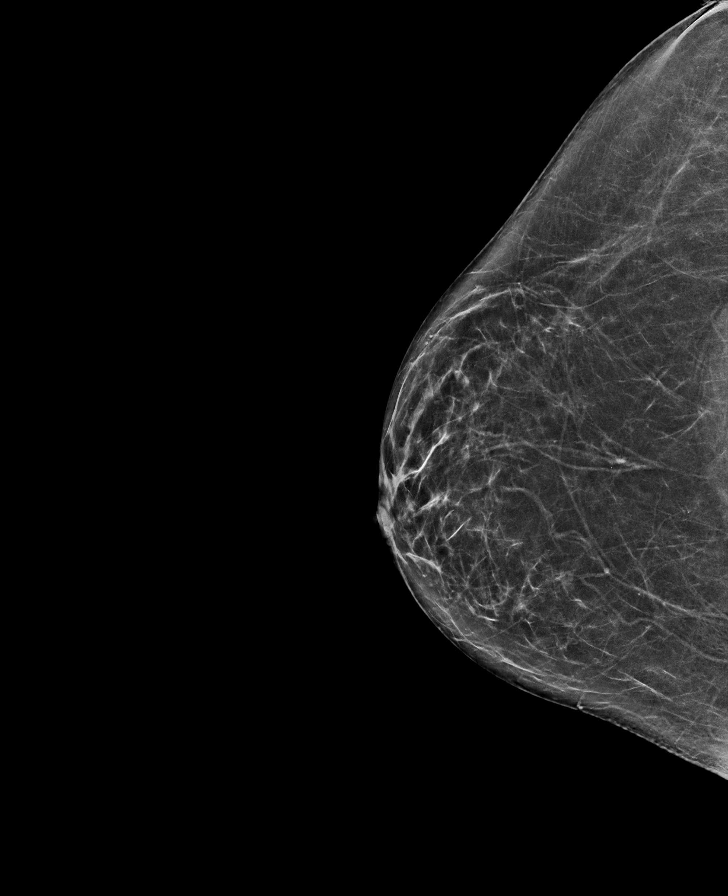

[R MLO synth-2D]
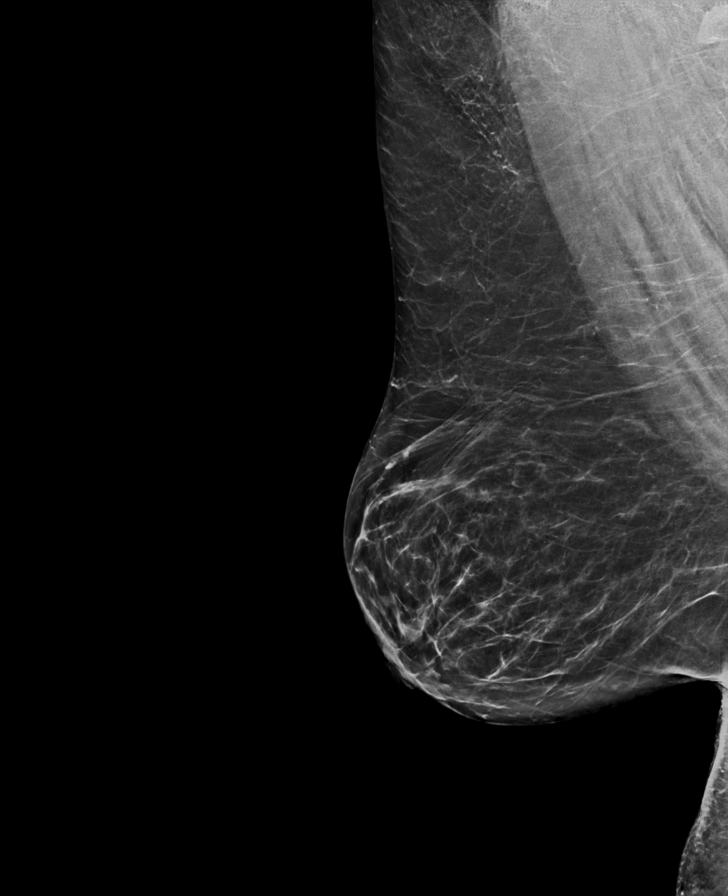

[R MLO tomo · tomo slice 35/68.0]
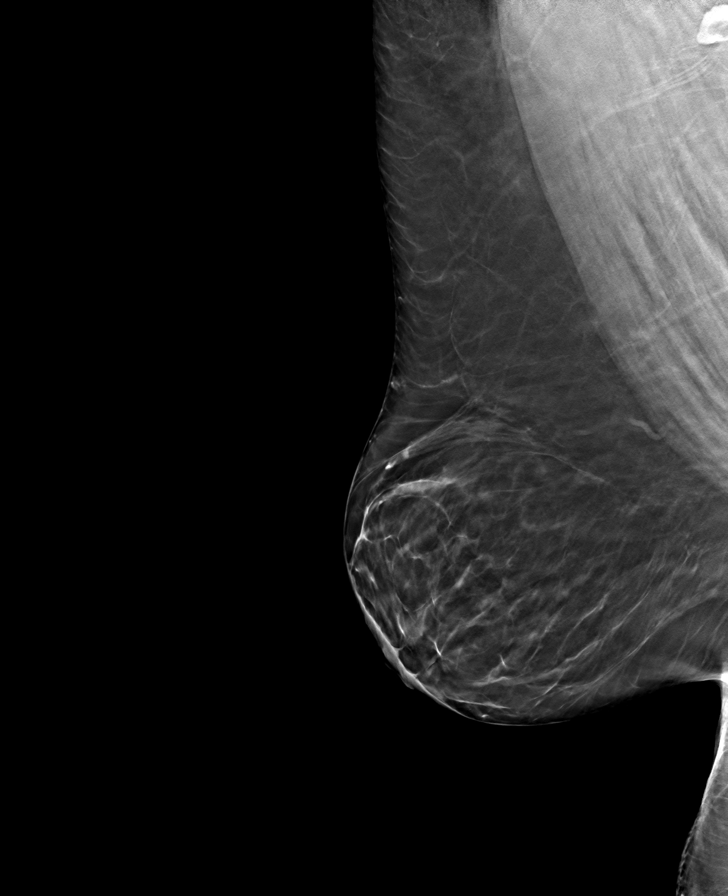

[R CC tomo · tomo slice 33/65.0]
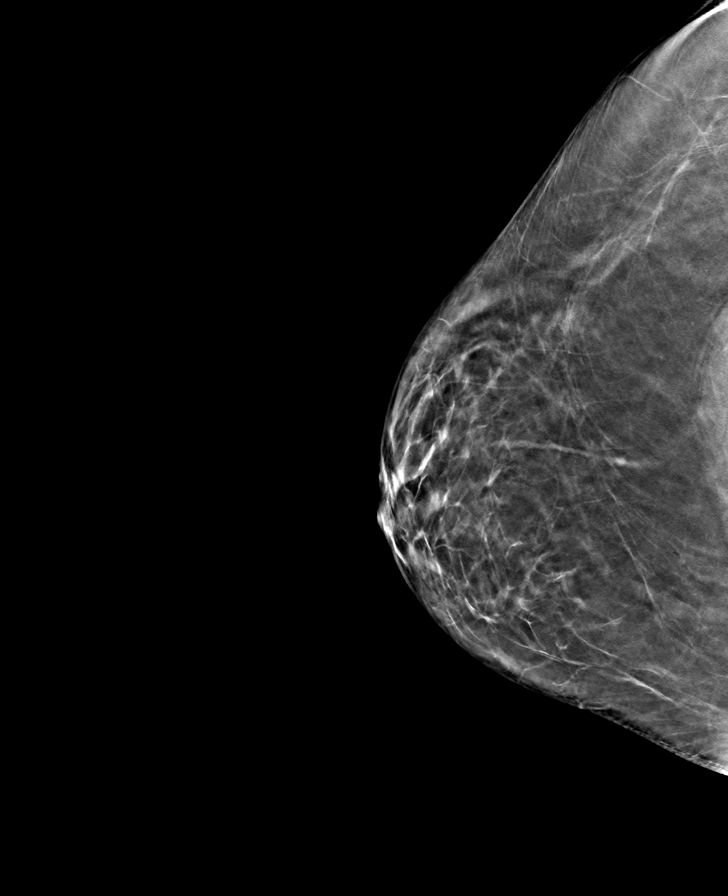

[L MLO tomo · tomo slice 35/68.0]
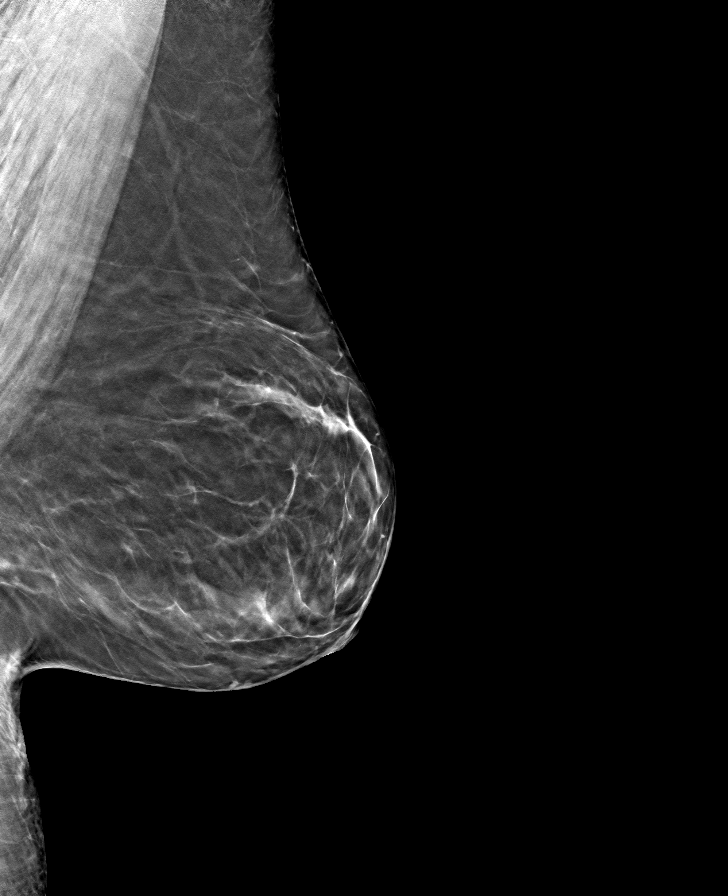

[L CC tomo · tomo slice 31/61.0]
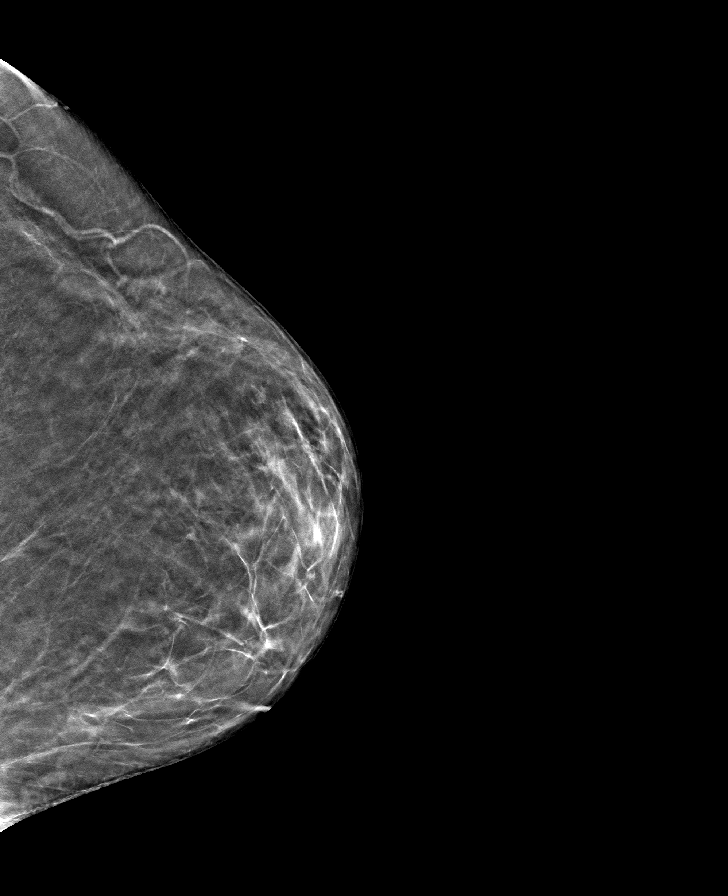

[8 of 24 positions shown; findings below may reference images not displayed]

ACR Breast Density Category b: There are scattered areas of
fibroglandular density.
FINDINGS: There are no findings suspicious for malignancy.
IMPRESSION: No mammographic evidence of malignancy. A result letter of this
screening mammogram will be mailed directly to the patient.

RECOMMENDATION:
Screening mammogram in one year. (Code:51-O-LD2)

BI-RADS CATEGORY  1: Negative.

## 2021-12-14 ENCOUNTER — Ambulatory Visit: Payer: Medicaid Other | Admitting: Nurse Practitioner

## 2021-12-14 ENCOUNTER — Encounter: Payer: Self-pay | Admitting: Nurse Practitioner

## 2021-12-14 VITALS — BP 112/71 | HR 68 | Temp 96.7°F | Ht 67.0 in | Wt 207.4 lb

## 2021-12-14 DIAGNOSIS — J020 Streptococcal pharyngitis: Secondary | ICD-10-CM | POA: Diagnosis not present

## 2021-12-14 DIAGNOSIS — J029 Acute pharyngitis, unspecified: Secondary | ICD-10-CM

## 2021-12-14 LAB — RAPID STREP SCREEN (MED CTR MEBANE ONLY): Strep Gp A Ag, IA W/Reflex: POSITIVE — AB

## 2021-12-14 MED ORDER — AMOXICILLIN-POT CLAVULANATE 875-125 MG PO TABS: 1.0000 | ORAL_TABLET | Freq: Two times a day (BID) | ORAL | 0 refills | Status: DC

## 2021-12-14 NOTE — Patient Instructions (Signed)
Sore Throat When you have a sore throat, your throat may feel: Tender. Burning. Irritated. Scratchy. Painful when you swallow. Painful when you talk. Many things can cause a sore throat, such as: An infection. Allergies. Dry air. Smoke or pollution. Radiation treatment for cancer. Gastroesophageal reflux disease (GERD). A tumor. A sore throat can be the first sign of another sickness. It can happen with other problems, like: Coughing. Sneezing. Fever. Swelling of the glands in the neck. Most sore throats go away without treatment. Follow these instructions at home:     Medicines Take over-the-counter and prescription medicines only as told by your doctor. Children often get sore throats. Do not give your child aspirin. Use throat sprays to soothe your throat as told by your health care provider. Managing pain To help with pain: Sip warm liquids, such as broth, herbal tea, or warm water. Eat or drink cold or frozen liquids, such as frozen ice pops. Rinse your mouth (gargle) with a salt water mixture 3-4 times a day or as needed. To make salt water, dissolve -1 tsp (3-6 g) of salt in 1 cup (237 mL) of warm water. Do not swallow this mixture. Suck on hard candy or throat lozenges. Put a cool-mist humidifier in your bedroom at night. Sit in the bathroom with the door closed for 5-10 minutes while you run hot water in the shower. General instructions Do not smoke or use any products that contain nicotine or tobacco. If you need help quitting, ask your doctor. Get plenty of rest. Drink enough fluid to keep your pee (urine) pale yellow. Wash your hands often for at least 20 seconds with soap and water. If soap and water are not available, use hand sanitizer. Contact a doctor if: You have a fever for more than 2-3 days. You keep having symptoms for more than 2-3 days. Your throat does not get better in 7 days. You have a fever and your symptoms suddenly get worse. Your  child who is 3 months to 3 years old has a temperature of 102.2F (39C) or higher. Get help right away if: You have trouble breathing. You cannot swallow fluids, soft foods, or your spit. You have swelling in your throat or neck that gets worse. You feel like you may vomit (nauseous) and this feeling lasts a long time. You cannot stop vomiting. These symptoms may be an emergency. Get help right away. Call your local emergency services (911 in the [place] Do not wait to see if the symptoms will go away. Do not drive yourself to the hospital. Summary A sore throat is a painful, burning, irritated, or scratchy throat. Many things can cause a sore throat. Take over-the-counter medicines only as told by your doctor. Get plenty of rest. Drink enough fluid to keep your pee (urine) pale yellow. Contact a doctor if your symptoms get worse or your sore throat does not get better within 7 days. This information is not intended to replace advice given to you by your health care provider. Make sure you discuss any questions you have with your health care provider. Document Revised: 08/11/2020 Document Reviewed: 08/11/2020 Elsevier Patient Education  2023 [employer] Inc[employer]  

## 2021-12-14 NOTE — Progress Notes (Signed)
Acute Office Visit  Subjective:     Patient ID: Pam Chen, female    DOB: 02-20-1971, 51 y.o.   MRN: [id number]  Chief Complaint  Patient presents with   Sore Throat    X 1 week     Sore Throat  This is a new problem. The current episode started in the past 7 days. The problem has been gradually worsening. There has been no fever. The pain is severe. Associated symptoms include swollen glands and trouble swallowing. Pertinent negatives include no abdominal pain, congestion, coughing, ear pain, hoarse voice or shortness of breath. She has had no exposure to strep. She has tried nothing for the symptoms.    Review of Systems  Constitutional: Negative.  Negative for chills and fever.  HENT:  Positive for sore throat and trouble swallowing. Negative for congestion, ear pain and hoarse voice.   Respiratory:  Negative for cough and shortness of breath.   Cardiovascular: Negative.   Gastrointestinal:  Negative for abdominal pain.  Skin: Negative.  Negative for itching and rash.  All other systems reviewed and are negative.       Objective:    BP 112/71   Pulse 68   Temp (!) 96.7 F (35.9 C) (Temporal)   Ht 5\' 7"  (1.702 m)   Wt 207 lb 6.4 oz (94.1 kg)   SpO2 98%   BMI 32.48 kg/m  BP Readings from Last 3 Encounters:  12/14/21 112/71  12/08/21 134/78  12/06/21 (!) 115/53   Wt Readings from Last 3 Encounters:  12/14/21 207 lb 6.4 oz (94.1 kg)  12/06/21 186 lb (84.4 kg)  11/09/21 186 lb (84.4 kg)      Physical Exam Vitals and nursing note reviewed.  Constitutional:      Appearance: Normal appearance. She is well-developed.  HENT:     Head: Normocephalic.     Right Ear: External ear normal.     Left Ear: External ear normal.     Mouth/Throat:     Lips: Pink.     Pharynx: Uvula midline. Pharyngeal swelling and posterior oropharyngeal erythema present.  Eyes:     Conjunctiva/sclera: Conjunctivae normal.  Cardiovascular:     Rate and Rhythm: Normal rate and  regular rhythm.     Pulses: Normal pulses.     Heart sounds: Normal heart sounds.  Pulmonary:     Effort: Pulmonary effort is normal.     Breath sounds: Normal breath sounds.  Abdominal:     General: Bowel sounds are normal.  Musculoskeletal:        General: Normal range of motion.  Skin:    General: Skin is warm and dry.  Neurological:     Mental Status: She is alert and oriented to person, place, and time.  Psychiatric:        Mood and Affect: Mood normal.        Behavior: Behavior normal.     No results found for any visits on 12/14/21.      Assessment & Plan:  Take meds as prescribed - Use a cool mist humidifier  -Use saline nose sprays frequently -Force fluids -For fever or aches or pains- take Tylenol or ibuprofen. -strep swab positive  -Augmentin 875-125 mg tablet by mouth daily.  -If symptoms do not improve, she may need to be COVID tested to rule this out Follow up with worsening unresolved symptoms  Problem List Items Addressed This Visit   None Visit Diagnoses     Sore throat    -  Primary   Relevant Orders   Culture, Group A Strep   Rapid Strep Screen ([facility] ONLY)   Strep pharyngitis       Relevant Medications   amoxicillin-clavulanate (AUGMENTIN) 875-125 MG tablet       Meds ordered this encounter  Medications   amoxicillin-clavulanate (AUGMENTIN) 875-125 MG tablet    Sig: Take 1 tablet by mouth 2 (two) times daily.    Dispense:  20 tablet    Refill:  0    Order Specific Question:   Supervising Provider    Answer:   Mechele Claude [[id number]]    Return if symptoms worsen or fail to improve.  Daryll Drown, NP

## 2021-12-19 ENCOUNTER — Encounter: Payer: Medicaid Other | Admitting: Orthopedic Surgery

## 2021-12-21 ENCOUNTER — Encounter: Payer: Medicaid Other | Admitting: Orthopedic Surgery

## 2021-12-23 ENCOUNTER — Ambulatory Visit (INDEPENDENT_AMBULATORY_CARE_PROVIDER_SITE_OTHER): Payer: Medicaid Other | Admitting: Orthopedic Surgery

## 2021-12-23 ENCOUNTER — Ambulatory Visit (INDEPENDENT_AMBULATORY_CARE_PROVIDER_SITE_OTHER): Payer: Self-pay

## 2021-12-23 ENCOUNTER — Encounter: Payer: Self-pay | Admitting: Orthopedic Surgery

## 2021-12-23 DIAGNOSIS — M19012 Primary osteoarthritis, left shoulder: Secondary | ICD-10-CM

## 2021-12-23 NOTE — Patient Instructions (Signed)
PT referral placed.  Please take protocol with you.   Follow up in 4 weeks

## 2021-12-23 NOTE — Progress Notes (Signed)
Orthopaedic Postop Note  Assessment: Pam Chen is a 51 y.o. female s/p left shoulder arthroscopy, with distal clavicle excision  DOS: 12/08/2021  Plan: Sutures were removed, Steri-Strips placed Radiographs demonstrate appropriate excision of distal clavicle Okay to come out of the sling Referral for physical therapy, protocol provided. Medications as needed. Follow-up in 1 month   Follow-up: Return in about 4 weeks (around 01/20/2022). XR at next visit: None  Subjective:  Chief Complaint  Patient presents with   Post-op Follow-up    Left shoulder 12/08/21    History of Present Illness: Pam Chen is a 51 y.o. female who presents following the above stated procedure.  Surgery was approximately 2 weeks ago.  She is doing well.  Her pain is improving.  She states it was very uncomfortable for the first couple of days, but since then she is doing better.  She continues to use her sling.  She has been provided pain medication from her pain management doctor.  Review of Systems: No fevers or chills No numbness or tingling No Chest Pain No shortness of breath   Objective: There were no vitals taken for this visit.  Physical Exam:  Left shoulder with healing surgical incisions.  No surrounding erythema or drainage.  There is some ecchymosis in the anterior aspect of the upper arm.  Sensation is intact throughout the left hand.  Sensation is intact in the axillary nerve distribution.  She tolerates gentle range of motion.  Fingers are warm and well-perfused  IMAGING: I personally ordered and reviewed the following images:  X-rays of the left shoulder were obtained in clinic today.  No acute injuries are noted.  Glenohumeral joint is reduced.  Distal clavicle has been removed.  No adverse features.  Good overall bone quality.  Impression: Left shoulder x-ray following distal clavicle excision, no acute injuries  Oliver Barre, MD 12/23/2021 10:11 PM

## 2021-12-26 ENCOUNTER — Ambulatory Visit: Payer: Medicaid Other | Admitting: Physical Therapy

## 2021-12-29 ENCOUNTER — Ambulatory Visit: Payer: Medicaid Other

## 2021-12-30 ENCOUNTER — Encounter: Payer: Self-pay | Admitting: Orthopedic Surgery

## 2022-01-02 ENCOUNTER — Ambulatory Visit: Payer: Medicaid Other | Attending: Orthopedic Surgery | Admitting: Physical Therapy

## 2022-01-20 ENCOUNTER — Encounter: Payer: Medicaid Other | Admitting: Orthopedic Surgery

## 2022-01-20 ENCOUNTER — Encounter: Payer: Self-pay | Admitting: Orthopedic Surgery

## 2022-03-13 ENCOUNTER — Telehealth: Payer: Self-pay

## 2022-03-13 NOTE — Telephone Encounter (Signed)
Transition Care Management Unsuccessful Follow-up Telephone Call  Date of discharge and from where:  1014/2023 [facility]   Attempts:  1st Attempt  Reason for unsuccessful TCM follow-up call:  Missing or invalid number - letter mailed

## 2022-03-14 ENCOUNTER — Ambulatory Visit: Payer: Medicaid Other | Admitting: Family Medicine

## 2022-04-26 ENCOUNTER — Encounter: Payer: Self-pay | Admitting: Family Medicine

## 2022-04-26 ENCOUNTER — Ambulatory Visit: Payer: Medicaid Other | Admitting: Family Medicine

## 2022-04-26 VITALS — BP 119/80 | HR 68 | Ht 67.0 in | Wt 199.0 lb

## 2022-04-26 DIAGNOSIS — R32 Unspecified urinary incontinence: Secondary | ICD-10-CM

## 2022-04-26 DIAGNOSIS — N3281 Overactive bladder: Secondary | ICD-10-CM

## 2022-04-26 DIAGNOSIS — E785 Hyperlipidemia, unspecified: Secondary | ICD-10-CM

## 2022-04-26 DIAGNOSIS — Z23 Encounter for immunization: Secondary | ICD-10-CM

## 2022-04-26 DIAGNOSIS — F411 Generalized anxiety disorder: Secondary | ICD-10-CM

## 2022-04-26 DIAGNOSIS — J40 Bronchitis, not specified as acute or chronic: Secondary | ICD-10-CM | POA: Diagnosis not present

## 2022-04-26 DIAGNOSIS — E039 Hypothyroidism, unspecified: Secondary | ICD-10-CM | POA: Diagnosis not present

## 2022-04-26 DIAGNOSIS — F319 Bipolar disorder, unspecified: Secondary | ICD-10-CM

## 2022-04-26 DIAGNOSIS — F410 Panic disorder [episodic paroxysmal anxiety] without agoraphobia: Secondary | ICD-10-CM

## 2022-04-26 MED ORDER — FLUTICASONE-SALMETEROL 250-50 MCG/ACT IN AEPB: 1.0000 | INHALATION_SPRAY | Freq: Two times a day (BID) | RESPIRATORY_TRACT | 1 refills | Status: DC

## 2022-04-26 MED ORDER — FLUOXETINE HCL 20 MG PO CAPS: 60.0000 mg | ORAL_CAPSULE | Freq: Every day | ORAL | 1 refills | Status: DC

## 2022-04-26 MED ORDER — ARIPIPRAZOLE 30 MG PO TABS
30.0000 mg | ORAL_TABLET | Freq: Every day | ORAL | 1 refills | Status: DC
Start: 2022-04-26 — End: 2022-10-25

## 2022-04-26 MED ORDER — CYCLOBENZAPRINE HCL 10 MG PO TABS: 10.0000 mg | ORAL_TABLET | Freq: Three times a day (TID) | ORAL | 2 refills | Status: DC | PRN

## 2022-04-26 MED ORDER — OXYBUTYNIN CHLORIDE ER 10 MG PO TB24: 10.0000 mg | ORAL_TABLET | Freq: Every day | ORAL | 3 refills | Status: DC

## 2022-04-26 MED ORDER — ALBUTEROL SULFATE HFA 108 (90 BASE) MCG/ACT IN AERS: INHALATION_SPRAY | RESPIRATORY_TRACT | 1 refills | Status: DC

## 2022-04-26 MED ORDER — BUSPIRONE HCL 7.5 MG PO TABS: 15.0000 mg | ORAL_TABLET | Freq: Two times a day (BID) | ORAL | 1 refills | Status: DC

## 2022-04-26 NOTE — Progress Notes (Addendum)
BP 119/80   Pulse 68   Ht _0  (1.702 m)   Wt 199 lb (90.3 kg)   SpO2 96%   BMI 31.17 kg/m    Subjective:   Patient ID: Pam Chen, female    DOB: June 04, 1970, 51 y.o.   MRN: [id number]  HPI: Pam Chen is a 51 y.o. female presenting on 04/26/2022 for Medical Management of Chronic Issues, Hyperlipidemia, and Shoulder Pain (Right and numbness right harm/hand)   HPI Bipolar 1 disorder Patient has bipolar 1 disorder and is currently on Prozac and Abilify for this.  She did see psychiatry and has not seen them anymore and wants to get refills with [name].  Denies any suicidal ideations.    04/26/2022    4:09 PM 12/14/2021   11:59 AM 11/09/2021    1:21 PM 06/22/2021   10:46 AM 03/30/2021    2:07 PM  Depression screen PHQ 2/9  Decreased Interest 1 1 0 0 1  Down, Depressed, Hopeless 1 0 0 0 0  PHQ - 2 Score 2 1 0 0 1  Altered sleeping 2 3  0 1  Tired, decreased energy 2 1  0 1  Change in appetite 2 1  0 0  Feeling bad or failure about yourself  1 0  0 0  Trouble concentrating 1 0  0 0  Moving slowly or fidgety/restless 1 0  0 0  Suicidal thoughts 0 0  0 0  PHQ-9 Score 11 6  0 3  Difficult doing work/chores Somewhat difficult Somewhat difficult  Not difficult at all     Hyperlipidemia Patient is coming in for recheck of his hyperlipidemia. The patient is currently taking no medicine currently, diet control, will check blood work.. They deny any issues with myalgias or history of liver damage from it. They deny any focal numbness or weakness or chest pain.   Hypothyroidism recheck Patient is coming in for thyroid recheck today as well. They deny any issues with hair changes or heat or cold problems or diarrhea or constipation. They deny any chest pain or palpitations. They are currently on no medicine currently, has been subclinical.  Right shoulder pain Patient has right shoulder pain.  It hurts more outpatient.  She says she does have some numbness.  Relevant past medical,  surgical, family and social history reviewed and updated as indicated. Interim medical history since our last visit reviewed. Allergies and medications reviewed and updated.  Review of Systems  Constitutional:  Negative for chills and fever.  Eyes:  Negative for visual disturbance.  Respiratory:  Negative for chest tightness and shortness of breath.   Cardiovascular:  Negative for chest pain and leg swelling.  Musculoskeletal:  Positive for arthralgias and myalgias. Negative for back pain and gait problem.  Skin:  Negative for rash.  Neurological:  Negative for dizziness, light-headedness and headaches.  Psychiatric/Behavioral:  Negative for agitation and behavioral problems.   All other systems reviewed and are negative.   Per HPI unless specifically indicated above   Allergies as of 04/26/2022       Reactions   Aspirin Other (See Comments)   Upset tummy.  Can take ibuprofen.   Oxycodone-acetaminophen Anxiety   Can take vicodin   Tramadol Hcl Nausea Only, Other (See Comments)   And headache And headache        Medication List        Accurate as of April 26, 2022 11:59 PM. If you have any questions, ask your nurse  or doctor.          STOP taking these medications    amoxicillin-clavulanate 875-125 MG tablet Commonly known as: AUGMENTIN Stopped by: Fransisca Kaufmann Kowen Kluth, MD   buprenorphine-naloxone 8-2 mg Subl SL tablet Commonly known as: SUBOXONE Stopped by: Worthy Rancher, MD   Emgality 120 MG/ML Soaj Generic drug: Galcanezumab-gnlm Stopped by: Fransisca Kaufmann Rilley Poulter, MD   multivitamin with minerals Tabs tablet Stopped by: Worthy Rancher, MD       TAKE these medications    albuterol 108 (90 Base) MCG/ACT inhaler Commonly known as: VENTOLIN HFA TAKE 1-2 PUFFS AS NEEDED FOR WHEEZING/CHEST TIGHTNESS   ARIPiprazole 30 MG tablet Commonly known as: ABILIFY Take 1 tablet (30 mg total) by mouth daily.   busPIRone 7.5 MG tablet Commonly known as:  BUSPAR Take 2 tablets (15 mg total) by mouth 2 (two) times daily.   cyclobenzaprine 10 MG tablet Commonly known as: FLEXERIL Take 1 tablet (10 mg total) by mouth 3 (three) times daily as needed for muscle spasms.   FLUoxetine 20 MG capsule Commonly known as: PROZAC Take 3 capsules (60 mg total) by mouth daily.   fluticasone-salmeterol 250-50 MCG/ACT Aepb Commonly known as: Advair Diskus Inhale 1 puff into the lungs in the morning and at bedtime. What changed: Another medication with the same name was removed. Continue taking this medication, and follow the directions you see here. Changed by: Worthy Rancher, MD   linaclotide 290 MCG Caps capsule Commonly known as: Linzess Take 1 capsule (290 mcg total) by mouth daily before breakfast.   ondansetron 4 MG disintegrating tablet Commonly known as: ZOFRAN-ODT Take 1 tablet (4 mg total) by mouth every 8 (eight) hours as needed.   oxybutynin 10 MG 24 hr tablet Commonly known as: Ditropan XL Take 1 tablet (10 mg total) by mouth at bedtime.   rizatriptan 10 MG disintegrating tablet Commonly known as: Maxalt-MLT Take 1 tablet (10 mg total) by mouth as needed. May repeat in 2 hours if needed         Objective:   BP 119/80   Pulse 68   Ht _0  (1.702 m)   Wt 199 lb (90.3 kg)   SpO2 96%   BMI 31.17 kg/m   Wt Readings from Last 3 Encounters:  05/09/22 207 lb (93.9 kg)  04/26/22 199 lb (90.3 kg)  12/14/21 207 lb 6.4 oz (94.1 kg)    Physical Exam Vitals and nursing note reviewed.  Constitutional:      General: She is not in acute distress.    Appearance: She is well-developed. She is not diaphoretic.  Eyes:     Conjunctiva/sclera: Conjunctivae normal.  Cardiovascular:     Rate and Rhythm: Normal rate and regular rhythm.     Heart sounds: Normal heart sounds. No murmur heard. Pulmonary:     Effort: Pulmonary effort is normal. No respiratory distress.     Breath sounds: Normal breath sounds. No wheezing.   Musculoskeletal:        General: No swelling or tenderness. Normal range of motion.     Right shoulder: Crepitus present. No swelling, deformity, laceration or tenderness. Normal range of motion. Normal strength.  Skin:    General: Skin is warm and dry.     Findings: No rash.  Neurological:     Mental Status: She is alert and oriented to person, place, and time.     Coordination: Coordination normal.  Psychiatric:        Behavior: Behavior  normal.       Assessment & Plan:   Problem List Items Addressed This Visit       Endocrine   Hypothyroidism - Primary   Relevant Orders   CMP14+EGFR (Completed)   TSH (Completed)     Other   Hyperlipidemia LDL goal <130   Relevant Orders   CMP14+EGFR (Completed)   Lipid panel (Completed)   Bipolar 1 disorder, depressed (HCC)   Relevant Medications   busPIRone (BUSPAR) 7.5 MG tablet   ARIPiprazole (ABILIFY) 30 MG tablet   Other Relevant Orders   CBC with Differential/Platelet (Completed)   Other Visit Diagnoses     Bronchitis       Relevant Medications   albuterol (VENTOLIN HFA) 108 (90 Base) MCG/ACT inhaler   Panic attack       Relevant Medications   busPIRone (BUSPAR) 7.5 MG tablet   FLUoxetine (PROZAC) 20 MG capsule   GAD (generalized anxiety disorder)       Relevant Medications   busPIRone (BUSPAR) 7.5 MG tablet   FLUoxetine (PROZAC) 20 MG capsule   Other Relevant Orders   CBC with Differential/Platelet (Completed)   OAB (overactive bladder)       Relevant Medications   oxybutynin (DITROPAN XL) 10 MG 24 hr tablet   Need for immunization against influenza       Relevant Orders   Flu Vaccine QUAD 66moIM (Fluarix, Fluzone & Alfiuria Quad PF) (Completed)   Urinary incontinence, unspecified type       Relevant Medications   oxybutynin (DITROPAN XL) 10 MG 24 hr tablet       Patient seems to be doing well except for a little bit of congestion and wheezing, recommended that she start doing the Advair every day.  She  takes Ditropan for overactive bladder and says it does well when she has it.  She did run out and has been struggling without it.  Patient has urinary incontinence and uses depends.  Needs refill signed for this. Follow up plan: Return in about 6 months (around 10/25/2022), or if symptoms worsen or fail to improve, for Thyroid and cholesterol and anxiety recheck.  Counseling provided for all of the vaccine components Orders Placed This Encounter  Procedures   Flu Vaccine QUAD 632moM (Fluarix, Fluzone & Alfiuria Quad PF)   CBC with Differential/Platelet   CMP14+EGFR   Lipid panel   TSH    JoCaryl PinaMD [facility] 05/10/2022, 4:29 PM

## 2022-04-27 ENCOUNTER — Telehealth: Payer: Self-pay | Admitting: Family Medicine

## 2022-04-27 LAB — LIPID PANEL
Chol/HDL Ratio: 3.6 ratio (ref 0.0–4.4)
Cholesterol, Total: 214 mg/dL — ABNORMAL HIGH (ref 100–199)
HDL: 59 mg/dL (ref >39)
LDL Chol Calc (NIH): 135 mg/dL — ABNORMAL HIGH (ref 0–99)
Triglycerides: 112 mg/dL (ref 0–149)
VLDL Cholesterol Cal: 20 mg/dL (ref 5–40)

## 2022-04-27 LAB — CMP14+EGFR
ALT: 23 IU/L (ref 0–32)
AST: 17 IU/L (ref 0–40)
Albumin/Globulin Ratio: 1.4 (ref 1.2–2.2)
Albumin: 3.8 g/dL — ABNORMAL LOW (ref 3.9–4.9)
Alkaline Phosphatase: 80 IU/L (ref 44–121)
BUN/Creatinine Ratio: 23 (ref 9–23)
BUN: 20 mg/dL (ref 6–24)
Bilirubin Total: <0.2 mg/dL (ref 0.0–1.2)
CO2: 28 mmol/L (ref 20–29)
Calcium: 9.6 mg/dL (ref 8.7–10.2)
Chloride: 102 mmol/L (ref 96–106)
Creatinine, Ser: 0.88 mg/dL (ref 0.57–1.00)
Globulin, Total: 2.7 g/dL (ref 1.5–4.5)
Glucose: 77 mg/dL (ref 70–99)
Potassium: 4.9 mmol/L (ref 3.5–5.2)
Sodium: 140 mmol/L (ref 134–144)
Total Protein: 6.5 g/dL (ref 6.0–8.5)
eGFR: 81 mL/min/{1.73_m2} (ref >59)

## 2022-04-27 LAB — CBC WITH DIFFERENTIAL/PLATELET
Basophils Absolute: 0.1 10*3/uL (ref 0.0–0.2)
Basos: 1 %
EOS (ABSOLUTE): 0 10*3/uL (ref 0.0–0.4)
Eos: 1 %
Hematocrit: 38.4 % (ref 34.0–46.6)
Hemoglobin: 12.5 g/dL (ref 11.1–15.9)
Immature Grans (Abs): 0 10*3/uL (ref 0.0–0.1)
Immature Granulocytes: 1 %
Lymphocytes Absolute: 2.4 10*3/uL (ref 0.7–3.1)
Lymphs: 40 %
MCH: 28.9 pg (ref 26.6–33.0)
MCHC: 32.6 g/dL (ref 31.5–35.7)
MCV: 89 fL (ref 79–97)
Monocytes Absolute: 0.5 10*3/uL (ref 0.1–0.9)
Monocytes: 8 %
Neutrophils Absolute: 3 10*3/uL (ref 1.4–7.0)
Neutrophils: 49 %
Platelets: 284 10*3/uL (ref 150–450)
RBC: 4.32 x10E6/uL (ref 3.77–5.28)
RDW: 13.6 % (ref 11.7–15.4)
WBC: 6 10*3/uL (ref 3.4–10.8)

## 2022-04-27 LAB — TSH: TSH: 3.4 u[IU]/mL (ref 0.450–4.500)

## 2022-04-27 MED ORDER — IBUPROFEN 800 MG PO TABS: 800.0000 mg | ORAL_TABLET | Freq: Three times a day (TID) | ORAL | 5 refills | Status: DC | PRN

## 2022-04-27 NOTE — Telephone Encounter (Signed)
800mg  tid prn #90 with 5RF sent to [facility]. Pt made aware.

## 2022-04-27 NOTE — Telephone Encounter (Signed)
Yes go ahead and send her a refill for ibuprofen 800 mg 3 times daily as needed and give her 90 with 5 refills

## 2022-04-27 NOTE — Telephone Encounter (Signed)
Patient calling about getting a refill on ibuprofen 800mg , said she meant to talk to [name] about it yesterday 11/29 at her appointment and she forgot. Would like to be called back to let her know if this can be sent in or not.

## 2022-05-01 ENCOUNTER — Other Ambulatory Visit: Payer: Self-pay | Admitting: Family Medicine

## 2022-05-01 ENCOUNTER — Other Ambulatory Visit: Payer: Self-pay

## 2022-05-01 DIAGNOSIS — Z1231 Encounter for screening mammogram for malignant neoplasm of breast: Secondary | ICD-10-CM

## 2022-05-01 MED ORDER — ROSUVASTATIN CALCIUM 5 MG PO TABS: 5.0000 mg | ORAL_TABLET | Freq: Every day | ORAL | 1 refills | Status: DC

## 2022-05-03 ENCOUNTER — Inpatient Hospital Stay: Admission: RE | Admit: 2022-05-03 | Payer: Medicaid Other | Source: Ambulatory Visit

## 2022-05-09 ENCOUNTER — Ambulatory Visit (INDEPENDENT_AMBULATORY_CARE_PROVIDER_SITE_OTHER): Payer: Medicaid Other | Admitting: Orthopedic Surgery

## 2022-05-09 ENCOUNTER — Encounter: Payer: Self-pay | Admitting: Orthopedic Surgery

## 2022-05-09 VITALS — BP 113/72 | HR 62 | Ht 67.0 in | Wt 207.0 lb

## 2022-05-09 DIAGNOSIS — M7522 Bicipital tendinitis, left shoulder: Secondary | ICD-10-CM | POA: Diagnosis not present

## 2022-05-09 DIAGNOSIS — Z9889 Other specified postprocedural states: Secondary | ICD-10-CM

## 2022-05-09 NOTE — Progress Notes (Signed)
Orthopaedic Postop Note  Assessment: Pam Chen is a 51 y.o. female s/p left shoulder arthroscopy, with distal clavicle excision  DOS: 12/08/2021  Plan: Pam Chen states that the pain she had prior to surgery 5 months ago is much better.  However, she has pain in the anterior aspect of the left shoulder, and this is recreated with stress of the long head of the biceps tendon.  She has not done physical therapy, I think this is a reasonable idea following her procedure.  Will place referral for physical therapy, and she will return to clinic in approximately 2 months.  If she has issues before then, she can return to clinic, and we can consider an injection of the biceps tendon sheath.   Follow-up: Return in about 2 months (around 07/10/2022). XR at next visit: None  Subjective:  Chief Complaint  Patient presents with   Shoulder Pain    Rt shoulder pain for years    Routine Post Op    Lt shoulder DOS 12/08/21    History of Present Illness: Pam Chen is a 51 y.o. female who returns to clinic for repeat evaluation of her left shoulder.  She had left shoulder arthroscopy, distal clavicle excision approximately 5 months ago.  She was seen only once in follow-up.  She no showed for multiple appointments after that.  We placed a referral for physical therapy, but she did not attend.  She states the pain in her left shoulder that she was having prior to surgery is much better.  However, she has pain over the anterior shoulder.  No recent injury.  She is also starting to have some right shoulder pain, similar to the left shoulder as it was prior to surgery.   Review of Systems: No fevers or chills No numbness or tingling No Chest Pain No shortness of breath   Objective: BP 113/72   Pulse 62   Ht 5\' 7"  (1.702 m)   Wt 207 lb (93.9 kg)   BMI 32.42 kg/m   Physical Exam:  Left shoulder surgical incisions are healed.  No surrounding erythema or drainage.  No tenderness to palpation  over the distal clavicle.  Tenderness to palpation over the bicipital groove.  Positive O'Brien's.  Negative Yergason's.  Negative speeds.  Pain in the front of the shoulder with strength testing.  IMAGING: I personally ordered and reviewed the following images:  No new imaging obtained today.  , MD 05/09/2022 9:07 AM

## 2022-05-09 NOTE — Patient Instructions (Signed)
Referral to physical therapy for your left shoulder.  Continue with topical treatments, including Biofreeze, patches or creams.

## 2022-05-11 ENCOUNTER — Telehealth: Payer: Self-pay | Admitting: Family Medicine

## 2022-05-11 NOTE — Telephone Encounter (Signed)
Pt called stating that [employer] has sent [name] a fax requesting PCP to approve for her to get the supplies she is requesting. Medicaid wont cover unless PCP signs giving approval.

## 2022-05-11 NOTE — Telephone Encounter (Signed)
If she is talking about the orders for her pads and depends, that was sent off either last week or earlier this week, I know I signed it sometime.

## 2022-05-11 NOTE — Telephone Encounter (Signed)
Pt aware.

## 2022-05-24 ENCOUNTER — Ambulatory Visit (HOSPITAL_COMMUNITY): Payer: Medicaid Other | Attending: Orthopedic Surgery | Admitting: Occupational Therapy

## 2022-05-24 DIAGNOSIS — M25512 Pain in left shoulder: Secondary | ICD-10-CM

## 2022-05-24 DIAGNOSIS — M25612 Stiffness of left shoulder, not elsewhere classified: Secondary | ICD-10-CM

## 2022-05-24 DIAGNOSIS — R29898 Other symptoms and signs involving the musculoskeletal system: Secondary | ICD-10-CM | POA: Diagnosis present

## 2022-05-24 NOTE — Therapy (Signed)
OUTPATIENT OCCUPATIONAL THERAPY ORTHO EVALUATION  Patient Name: Pam Chen MRN: [id number] DOB:16-Jun-1970, 51 y.o., female Today's Date: 05/24/2022  PCP: [name], [name], MD REFERRING PROVIDER: Thane Edu, MD  END OF SESSION:  OT End of Session - 05/24/22 1512     Visit Number 1    Number of Visits 9    Date for OT Re-Evaluation 06/30/22    Authorization Type Medicaid [place]    Authorization Time Period Requesting 8 visits    OT Start Time 1430    OT Stop Time 1510    OT Time Calculation (min) 40 min    Activity Tolerance Patient tolerated treatment well    Behavior During Therapy [facility] for tasks assessed/performed             Past Medical History:  Diagnosis Date   Anxiety    Arthritis    Bipolar disorder (HCC)    Current every day smoker 03/07/2017   Depression    Diverticulitis    GERD (gastroesophageal reflux disease)    Herpes simplex type 1 infection 02/13/2019   Herpes simplex type 2 infection 02/13/2019   History of adenomatous polyp of colon 05/02/2016   Adenoma 05/02/16; repeat 04/2021; Barbaraann Share, MD [facility])   History of bipolar disorder 08/25/2019   History of partial colectomy 03/28/2016   diverticulitis   Hypertension    Hypothyroid 02/02/2017   Kidney stones    Migraine    Obesity (BMI 30.0-34.9) 02/02/2017   Polysubstance dependence including opioid type drug with complication, episodic abuse (HCC) 08/25/2019   Rotator cuff injury    Sleep apnea    CPAP   Tennis elbow    Thyroid disease    Past Surgical History:  Procedure Laterality Date   ABDOMINAL HYSTERECTOMY     COLON SURGERY     diverticulitis   LITHOTRIPSY     SHOULDER ARTHROSCOPY WITH DISTAL CLAVICLE RESECTION Left 12/08/2021   Procedure: SHOULDER ARTHROSCOPY WITH DISTAL CLAVICLE RESECTION;  Surgeon: Oliver Barre, MD;  Location: [facility];  Service: Orthopedics;  Laterality: Left;   SLEEVE GASTROPLASTY     Patient Active Problem List   Diagnosis Date Noted   Chronic  migraine with aura 08/31/2020   Constipation 08/20/2020   Hypothyroidism 07/23/2020   Polysubstance dependence including opioid type drug with complication, episodic abuse (HCC) 08/25/2019   Insomnia 08/18/2019   Herpes simplex type 1 infection 02/13/2019   Herpes simplex type 2 infection 02/13/2019   Hyperlipidemia LDL goal <130 05/01/2017   Current every day smoker 03/07/2017   Obesity (BMI 30.0-34.9) 02/02/2017   Insomnia disorder 02/02/2017   GERD (gastroesophageal reflux disease) 02/02/2017   Migraine 02/02/2017   Obstructive sleep apnea syndrome 05/24/2016   History of adenomatous polyp of colon 05/02/2016   History of partial colectomy 03/28/2016   Bipolar 1 disorder, depressed (HCC) 01/09/2016    ONSET DATE: 12/08/21  REFERRING DIAG: L shoulder arthroplasty  THERAPY DIAG:  Acute pain of left shoulder  Stiffness of left shoulder, not elsewhere classified  Other symptoms and signs involving the musculoskeletal system  Rationale for Evaluation and Treatment: Rehabilitation  SUBJECTIVE:   SUBJECTIVE STATEMENT: "I thought I could rehab myself but 2 months in and I got a twinge." Pt accompanied by: self  PERTINENT HISTORY: Pt has been having shoulder pain for 6 years. Had her shoulder surgery in July, and did not follow up with therapy, as she reported her pain was gone and she felt she could rehab herself. 2 months  in to recovery pt reported a twinge of pain and it has worsened ever since. Pt is very active, pt works at [employer] in [place], however has been out of work since her surgery.   PRECAUTIONS: None  WEIGHT BEARING RESTRICTIONS: No  PAIN:  Are you having pain? Yes: NPRS scale: 3/10 Pain location: anterior shoulder girdle to bicep Pain description: tight, pulling pain with a catch Aggravating factors: movement and lifting Relieving factors: Icy hot and Biofreeze  FALLS: Has patient fallen in last 6 months? No  LIVING ENVIRONMENT: Lives with: lives with  their partner Lives in: House/apartment Stairs: Yes: Internal: full flight steps; on left going up  PLOF: Independent  PATIENT GOALS: "To get to where I can do simple things with my arm without pain"  NEXT MD VISIT: Feb 2024  OBJECTIVE:   HAND DOMINANCE: Right  ADLs: Overall ADLs: Pt reports difficulty with dressing and bathing due to pain, reports she has even dropped her coffee cup due to her shoulder "catching". Her Fiance assists with all IADL's.  FUNCTIONAL OUTCOME MEASURES: Upper Extremity Functional Scale (UEFS): 43.8%  UPPER EXTREMITY ROM:     Active ROM Left eval  Shoulder flexion 140  Shoulder abduction 134  Shoulder internal rotation 90  Shoulder external rotation 45  (Blank rows = not tested)   UPPER EXTREMITY MMT:     MMT Left eval  Shoulder flexion 4/5  Shoulder abduction 4/5  Shoulder adduction 4/5  Shoulder extension 4+/5  Shoulder internal rotation 4+/5  Shoulder external rotation 4-/5  Elbow flexion 5/5  Elbow extension 5/5  (Blank rows = not tested)  HAND FUNCTION: Grip strength: Right: 100 lbs; Left: 100 lbs  SENSATION: WFL  EDEMA: No swelling noted  OBSERVATIONS: moderate fascial restrictions in the subscapularis, trapezius, and anterior shoulder girdle   TODAY'S TREATMENT:                                                                                                                              DATE: 05/24/22: Evaluation    PATIENT EDUCATION: Education details: Wall Slides Person educated: Patient Education method: Explanation, Demonstration, and Handouts Education comprehension: verbalized understanding and returned demonstration  HOME EXERCISE PROGRAM: 12/27: Wall slides  GOALS: Goals reviewed with patient? Yes  SHORT TERM GOALS: Target date: 06/30/22  Pt will be provided with and educated on HEP to improve mobility in LUE required for ADL completion. Goal status: INITIAL  2.  Pt will decrease pain in LUE to 2/10 or  less in order to sleep for 3+ consecutive hours without waking due to pain. Goal status: INITIAL  3.  Pt will decrease LUE fascial restrictions to minimal amounts or less to improve mobility require for overhead reaching. Goal status: INITIAL  4.  Pt will increase A/ROM of LUE to [facility] to improve ability to reach overhead and behind back during dressing and bathing tasks. Goal status: INITIAL  5.  Pt will increase strength in LUE  to 5/5 to improve ability to perform lifting tasks required by cooking and cleaning.  Goal status: INITIAL    ASSESSMENT:  CLINICAL IMPRESSION: Patient is a 51 y.o. female who was seen today for occupational therapy evaluation for L shoulder pain and limited ROM s/p L RCR.   PERFORMANCE DEFICITS: in functional skills including ADLs, IADLs, ROM, strength, pain, fascial restrictions, Gross motor control, body mechanics, and UE functional use.  IMPAIRMENTS: are limiting patient from ADLs, IADLs, rest and sleep, work, leisure, and social participation.   COMORBIDITIES: has no other co-morbidities that affects occupational performance. Patient will benefit from skilled OT to address above impairments and improve overall function.  MODIFICATION OR ASSISTANCE TO COMPLETE EVALUATION: No modification of tasks or assist necessary to complete an evaluation.  OT OCCUPATIONAL PROFILE AND HISTORY: Problem focused assessment: Including review of records relating to presenting problem.  CLINICAL DECISION MAKING: LOW - limited treatment options, no task modification necessary  REHAB POTENTIAL: Good  EVALUATION COMPLEXITY: Low      PLAN:  OT FREQUENCY: 2x/week  OT DURATION: 4 weeks  PLANNED INTERVENTIONS: self care/ADL training, therapeutic exercise, therapeutic activity, manual therapy, passive range of motion, functional mobility training, moist heat, cryotherapy, patient/family education, energy conservation, and DME and/or AE instructions  RECOMMENDED OTHER  SERVICES: N/A  CONSULTED AND AGREED WITH PLAN OF CARE: Patient  PLAN FOR NEXT SESSION: Manual Therapy, P/ROM, AA/ROM, A/ROM, proximal shoulder strengthening   Trish Mage, OTR/L [facility] [phone] [place] Asencio Rosemarie Beath, OT 05/24/2022, 3:15 PM

## 2022-05-26 ENCOUNTER — Encounter (HOSPITAL_COMMUNITY): Payer: Medicaid Other | Admitting: Occupational Therapy

## 2022-06-01 ENCOUNTER — Encounter: Payer: Medicaid Other | Admitting: Family

## 2022-06-02 ENCOUNTER — Encounter (HOSPITAL_COMMUNITY): Payer: Medicaid Other | Admitting: Occupational Therapy

## 2022-06-05 ENCOUNTER — Inpatient Hospital Stay: Admission: RE | Admit: 2022-06-05 | Payer: Medicaid Other | Source: Ambulatory Visit

## 2022-06-06 ENCOUNTER — Encounter (HOSPITAL_COMMUNITY): Payer: Medicaid Other | Admitting: Occupational Therapy

## 2022-06-07 ENCOUNTER — Other Ambulatory Visit: Payer: Self-pay

## 2022-06-07 ENCOUNTER — Emergency Department (HOSPITAL_COMMUNITY)
Admission: EM | Admit: 2022-06-07 | Discharge: 2022-06-07 | Disposition: A | Discharge status: Home / Self Care | Payer: Medicaid Other | Attending: Emergency Medicine | Admitting: Emergency Medicine

## 2022-06-07 ENCOUNTER — Emergency Department (HOSPITAL_COMMUNITY): Payer: Medicaid Other

## 2022-06-07 ENCOUNTER — Telehealth: Payer: Self-pay | Admitting: Orthopedic Surgery

## 2022-06-07 ENCOUNTER — Encounter (HOSPITAL_COMMUNITY): Payer: Self-pay | Admitting: *Deleted

## 2022-06-07 DIAGNOSIS — S40012A Contusion of left shoulder, initial encounter: Secondary | ICD-10-CM

## 2022-06-07 DIAGNOSIS — I10 Essential (primary) hypertension: Secondary | ICD-10-CM | POA: Insufficient documentation

## 2022-06-07 DIAGNOSIS — W541XXA Struck by dog, initial encounter: Secondary | ICD-10-CM | POA: Diagnosis not present

## 2022-06-07 DIAGNOSIS — Y93K1 Activity, walking an animal: Secondary | ICD-10-CM | POA: Diagnosis not present

## 2022-06-07 DIAGNOSIS — F17200 Nicotine dependence, unspecified, uncomplicated: Secondary | ICD-10-CM | POA: Diagnosis not present

## 2022-06-07 DIAGNOSIS — E039 Hypothyroidism, unspecified: Secondary | ICD-10-CM | POA: Diagnosis not present

## 2022-06-07 DIAGNOSIS — W19XXXA Unspecified fall, initial encounter: Secondary | ICD-10-CM

## 2022-06-07 DIAGNOSIS — S4992XA Unspecified injury of left shoulder and upper arm, initial encounter: Secondary | ICD-10-CM | POA: Diagnosis present

## 2022-06-07 MED ORDER — KETOROLAC TROMETHAMINE 15 MG/ML IJ SOLN
15.0000 mg | Freq: Once | INTRAMUSCULAR | Status: AC
  Administered 2022-06-07: 15 mg via INTRAMUSCULAR
  Filled 2022-06-07: qty 1

## 2022-06-07 NOTE — ED Provider Notes (Signed)
[facility] EMERGENCY DEPARTMENT Provider Note   CSN: [id number] Arrival date & time: 06/07/22  1912     History  Chief Complaint  Patient presents with   Lytle Michaels    Pam Chen is a 52 y.o. female.   Fall  Patient presents with left shoulder pain.  Began after fall.  Tripped and landed on her left shoulder.  May have hit head.  No blood thinners.  Had previous surgery of left shoulder and has been through physical therapy.  Had reportedly frozen up again.  Pain with movement of the shoulder.  No neck pain.  No difficulty breathing.  Patient denies loss of consciousness.    Past Medical History:  Diagnosis Date   Anxiety    Arthritis    Bipolar disorder ([place])    Current every day smoker 03/07/2017   Depression    Diverticulitis    GERD (gastroesophageal reflux disease)    Herpes simplex type 1 infection 02/13/2019   Herpes simplex type 2 infection 02/13/2019   History of adenomatous polyp of colon 05/02/2016   Adenoma 05/02/16; repeat 04/2021; [name], MD [facility])   History of bipolar disorder 08/25/2019   History of partial colectomy 03/28/2016   diverticulitis   Hypertension    Hypothyroid 02/02/2017   Kidney stones    Migraine    Obesity (BMI 30.0-34.9) 02/02/2017   Polysubstance dependence including opioid type drug with complication, episodic abuse ([place]) 08/25/2019   Rotator cuff injury    Sleep apnea    CPAP   Tennis elbow    Thyroid disease     Home Medications Prior to Admission medications   Medication Sig Start Date End Date Taking? Authorizing Provider  albuterol (VENTOLIN HFA) 108 (90 Base) MCG/ACT inhaler TAKE 1-2 PUFFS AS NEEDED FOR WHEEZING/CHEST TIGHTNESS 04/26/22   [name], Fransisca Kaufmann, MD  ARIPiprazole (ABILIFY) 30 MG tablet Take 1 tablet (30 mg total) by mouth daily. 04/26/22   [name], Fransisca Kaufmann, MD  busPIRone (BUSPAR) 7.5 MG tablet Take 2 tablets (15 mg total) by mouth 2 (two) times daily. 04/26/22   [name], Fransisca Kaufmann, MD   cyclobenzaprine (FLEXERIL) 10 MG tablet Take 1 tablet (10 mg total) by mouth 3 (three) times daily as needed for muscle spasms. Patient not taking: Reported on 05/09/2022 04/26/22   [name], Fransisca Kaufmann, MD  FLUoxetine (PROZAC) 20 MG capsule Take 3 capsules (60 mg total) by mouth daily. 04/26/22   [name], Fransisca Kaufmann, MD  fluticasone-salmeterol (ADVAIR DISKUS) 250-50 MCG/ACT AEPB Inhale 1 puff into the lungs in the morning and at bedtime. 04/26/22   [name], Fransisca Kaufmann, MD  ibuprofen (ADVIL) 800 MG tablet Take 1 tablet (800 mg total) by mouth 3 (three) times daily as needed. 04/27/22   [name], Fransisca Kaufmann, MD  linaclotide Rolan Lipa) 290 MCG CAPS capsule Take 1 capsule (290 mcg total) by mouth daily before breakfast. Patient not taking: Reported on 05/09/2022 11/09/21   [name], Fransisca Kaufmann, MD  ondansetron (ZOFRAN-ODT) 4 MG disintegrating tablet Take 1 tablet (4 mg total) by mouth every 8 (eight) hours as needed. Patient not taking: Reported on 05/09/2022 08/11/20   Gwenlyn Perking, FNP  oxybutynin (DITROPAN XL) 10 MG 24 hr tablet Take 1 tablet (10 mg total) by mouth at bedtime. Patient not taking: Reported on 05/09/2022 04/26/22   [name], Fransisca Kaufmann, MD  rizatriptan (MAXALT-MLT) 10 MG disintegrating tablet Take 1 tablet (10 mg total) by mouth as needed. May repeat in 2 hours if needed Patient not taking: Reported  on 05/09/2022 11/09/21   [name], Fransisca Kaufmann, MD  rosuvastatin (CRESTOR) 5 MG tablet Take 1 tablet (5 mg total) by mouth daily. Patient not taking: Reported on 05/09/2022 05/01/22   [name], Fransisca Kaufmann, MD      Allergies    Aspirin, Oxycodone-acetaminophen, and Tramadol hcl    Review of Systems   Review of Systems  Physical Exam Updated Vital Signs BP 122/84 (BP Location: Right Arm)   Pulse 66   Temp 99.6 F (37.6 C) (Temporal)   Resp 19   Ht 5\' 7"  (1.702 m)   Wt 81.6 kg   SpO2 97%   BMI 28.19 kg/m  Physical Exam Vitals and nursing note reviewed.  Chest:     Chest wall:  No tenderness.  Abdominal:     Tenderness: There is no abdominal tenderness.  Musculoskeletal:     Cervical back: No tenderness.     Comments: Tenderness to left shoulder laterally.  Decreased range of motion.  Appears to be located.  Mild tenderness over lateral clavicle without deformity.  Neurovascular intact in hand.  Neurological:     Mental Status: She is alert.     ED Results / Procedures / Treatments   Labs (all labs ordered are listed, but only abnormal results are displayed) Labs Reviewed - No data to display  EKG None  Radiology DG Shoulder Left  Result Date: 06/07/2022 CLINICAL DATA:  Trauma, fall EXAM: LEFT SHOULDER - 2+ VIEW COMPARISON:  12/23/2021 FINDINGS: No fracture or dislocation is seen. Tiny bony spurs are noted in the inferior aspect of glenohumeral joint. IMPRESSION: No recent fracture or dislocation is seen in left shoulder. Electronically Signed   By: Elmer Picker M.D.   On: 06/07/2022 20:31    Procedures Procedures    Medications Ordered in ED Medications  ketorolac (TORADOL) 15 MG/ML injection 15 mg (15 mg Intramuscular Given 06/07/22 2052)    ED Course/ Medical Decision Making/ A&P                           Medical Decision Making Amount and/or Complexity of Data Reviewed Radiology: ordered.  Risk Prescription drug management.   Patient with fall and left shoulder pain.  Differential diagnosis includes contusion, dislocation, fracture.  X-ray independently interpreted and does not show dislocation or fracture.  Will have follow-up with Ortho surgery as needed.  Shot of Toradol given here.  Has had mild stomach upset with aspirin but tolerates Motrin.  Appears stable for discharge home.       Final Clinical Impression(s) / ED Diagnoses Final diagnoses:  Fall, initial encounter  Contusion of left shoulder, initial encounter    Rx / DC Orders ED Discharge Orders     None         Davonna Belling, MD 06/07/22 2117

## 2022-06-07 NOTE — ED Notes (Signed)
Pt having Left shoulder pain post fall directly onto shoulder. Limited range of motion. She is able to move it but with pain. Strong radial pulse present. Strong grip present. Hand warm to touch and no edema noted at this time. Color is comparable to R hand. Bryson Corona Edd Fabian

## 2022-06-07 NOTE — ED Triage Notes (Signed)
Pt fell and landed on left shoulder this afternoon.  Pt was walking dogs on leash and dogs pulled her down.  Pt states hit her head on ground , pt denies LOC.  Pt with recent surgery on left shoulder.  Pt's SO states pt may have passed out. Pt with limited ROM to left shoulder.

## 2022-06-07 NOTE — Telephone Encounter (Signed)
Patient lvm that she fell two days ago on her lt shoulder, which Dr. Bartholomew Crews did surgery on in July.  She is in a lot of pain and wanting to be seen asap.  First available with him is next Tuesday.  Should I offer that and if she doesn't want to wait, advise for her to go to the ED?  Pt's # [phone]

## 2022-06-09 ENCOUNTER — Encounter (HOSPITAL_COMMUNITY): Payer: Medicaid Other | Admitting: Occupational Therapy

## 2022-06-12 ENCOUNTER — Encounter (HOSPITAL_COMMUNITY): Payer: Medicaid Other | Admitting: Occupational Therapy

## 2022-06-13 ENCOUNTER — Ambulatory Visit: Payer: Medicaid Other | Admitting: Orthopedic Surgery

## 2022-06-13 ENCOUNTER — Ambulatory Visit (INDEPENDENT_AMBULATORY_CARE_PROVIDER_SITE_OTHER): Payer: Medicaid Other

## 2022-06-13 ENCOUNTER — Encounter: Payer: Self-pay | Admitting: Orthopedic Surgery

## 2022-06-13 DIAGNOSIS — M7522 Bicipital tendinitis, left shoulder: Secondary | ICD-10-CM

## 2022-06-13 MED ORDER — CYCLOBENZAPRINE HCL 10 MG PO TABS: 10.0000 mg | ORAL_TABLET | Freq: Two times a day (BID) | ORAL | 0 refills | Status: DC | PRN

## 2022-06-13 NOTE — Progress Notes (Signed)
Orthopaedic Postop Note  Assessment: Pam Chen is a 52 y.o. female s/p left shoulder arthroscopy, with distal clavicle excision  DOS: 12/08/2021  Plan: Pam Chen continues to have pain in her left shoulder.  She states that she fell recently, and her pain has been worse since then.  She notes pain in the anterior aspect of her shoulder.  We discussed multiple treatment options, and she would like to proceed with another injection.  This was completed in clinic today.  I will see her back in 6 weeks for reassessment.  Procedure note injection Left shoulder    Verbal consent was obtained to inject the left shoulder, subacromial space Timeout was completed to confirm the site of injection.  The skin was prepped with alcohol and ethyl chloride was sprayed at the injection site.  A 21-gauge needle was used to inject 40 mg of Depo-Medrol and 1% lidocaine (3 cc) into the subacromial space of the left shoulder using a posterolateral approach.  There were no complications. A sterile bandage was applied.   Follow-up: Return in about 6 weeks (around 07/25/2022). XR at next visit: None  Subjective:  Chief Complaint  Patient presents with   Follow-up    Recheck on left shoulder, DOS 12-08-21.    History of Present Illness: Pam Chen is a 52 y.o. female who returns to clinic for repeat evaluation of her left shoulder.  She has started to work with physical therapy.  However, she reports that she fell.  Recently, her dog wrapped around her feet, and dragged her to the ground.  She reported the emergency department.  Radiographs were obtained, which demonstrates no acute injury.  Since then, she continues to have pain in the anterior and lateral aspect of the shoulder.  She has difficulty with overhead motion.  Medications are not improving her symptoms.   Review of Systems: No fevers or chills No numbness or tingling No Chest Pain No shortness of breath   Objective: There were no  vitals taken for this visit.  Physical Exam:  Left shoulder surgical incisions are healed.  No surrounding erythema or drainage.  No tenderness to palpation over the distal clavicle.  Tenderness to palpation over the bicipital groove.  Positive O'Brien's.  Negative Yergason's.  Negative speeds.     IMAGING: I personally ordered and reviewed the following images:  X-rays left shoulder were obtained in clinic today.  These are compared to prior x-rays.  No acute injuries are noted.  No evidence of proximal humeral migration.  No fractures.  Glenohumeral joint is reduced.  No bony lesions.  Impression: Negative left shoulder x-ray.  Mordecai Rasmussen, MD 06/13/2022 2:04 PM

## 2022-06-13 NOTE — Patient Instructions (Signed)

## 2022-06-14 ENCOUNTER — Encounter (HOSPITAL_COMMUNITY): Payer: Medicaid Other | Admitting: Occupational Therapy

## 2022-06-19 ENCOUNTER — Encounter (HOSPITAL_COMMUNITY): Payer: Medicaid Other | Admitting: Occupational Therapy

## 2022-06-19 ENCOUNTER — Telehealth (HOSPITAL_COMMUNITY): Payer: Self-pay | Admitting: Occupational Therapy

## 2022-06-19 NOTE — Telephone Encounter (Signed)
OT called pt and left a Voicemail at 1340 on 06/19/21 regarding pt's 2nd No Show. OT left a HIPAA appropriate VM, notifying her of her next appointment and requesting she call the office back to let us know if she wants to continue with therapy. Informed patient that she will need to make appointments 1 at a time going forward due to multiple cancellations and no shows.   Paulita Fujita, OTR/L [facility] [phone]

## 2022-06-20 ENCOUNTER — Encounter: Payer: Medicaid Other | Admitting: Family

## 2022-06-21 ENCOUNTER — Encounter (HOSPITAL_COMMUNITY): Payer: Medicaid Other | Admitting: Occupational Therapy

## 2022-06-26 ENCOUNTER — Inpatient Hospital Stay: Admission: RE | Admit: 2022-06-26 | Payer: Medicaid Other | Source: Ambulatory Visit

## 2022-07-04 ENCOUNTER — Ambulatory Visit: Payer: Medicaid Other | Admitting: Orthopedic Surgery

## 2022-07-11 ENCOUNTER — Encounter: Payer: Self-pay | Admitting: Family

## 2022-07-11 ENCOUNTER — Ambulatory Visit: Payer: Medicaid Other | Admitting: Family

## 2022-07-11 ENCOUNTER — Other Ambulatory Visit (HOSPITAL_COMMUNITY)
Admission: RE | Admit: 2022-07-11 | Discharge: 2022-07-11 | Disposition: A | Discharge status: Home / Self Care | Payer: Medicaid Other | Source: Ambulatory Visit | Attending: Family | Admitting: Family

## 2022-07-11 VITALS — BP 119/72 | HR 71 | Temp 97.5°F | Ht 67.0 in | Wt 209.0 lb

## 2022-07-11 DIAGNOSIS — Z01419 Encounter for gynecological examination (general) (routine) without abnormal findings: Secondary | ICD-10-CM | POA: Diagnosis present

## 2022-07-11 DIAGNOSIS — E669 Obesity, unspecified: Secondary | ICD-10-CM | POA: Diagnosis not present

## 2022-07-11 DIAGNOSIS — G43001 Migraine without aura, not intractable, with status migrainosus: Secondary | ICD-10-CM | POA: Diagnosis not present

## 2022-07-11 DIAGNOSIS — E785 Hyperlipidemia, unspecified: Secondary | ICD-10-CM | POA: Diagnosis not present

## 2022-07-11 DIAGNOSIS — Z0000 Encounter for general adult medical examination without abnormal findings: Secondary | ICD-10-CM

## 2022-07-11 DIAGNOSIS — F17290 Nicotine dependence, other tobacco product, uncomplicated: Secondary | ICD-10-CM

## 2022-07-11 DIAGNOSIS — F17200 Nicotine dependence, unspecified, uncomplicated: Secondary | ICD-10-CM | POA: Diagnosis not present

## 2022-07-11 DIAGNOSIS — Z6832 Body mass index (BMI) 32.0-32.9, adult: Secondary | ICD-10-CM

## 2022-07-11 DIAGNOSIS — Z0001 Encounter for general adult medical examination with abnormal findings: Secondary | ICD-10-CM | POA: Diagnosis not present

## 2022-07-11 DIAGNOSIS — Z01411 Encounter for gynecological examination (general) (routine) with abnormal findings: Secondary | ICD-10-CM | POA: Diagnosis not present

## 2022-07-11 MED ORDER — ONDANSETRON 4 MG PO TBDP: 4.0000 mg | ORAL_TABLET | Freq: Three times a day (TID) | ORAL | 11 refills | Status: DC | PRN

## 2022-07-11 NOTE — Patient Instructions (Signed)

## 2022-07-11 NOTE — Progress Notes (Signed)
Subjective:    Patient ID: Pam Chen, female    DOB: 07-28-1970, 52 y.o.   MRN: [id number]  Chief Complaint  Patient presents with   Annual Exam   PT presents to the office today CPE with pap. She wanted to see a female provider for her pap. She is followed by her PCP 6 months.   She reports she quit smoking cigarettes, but vaping 10 times a day. Reports she quit cocaine and benzodiazepines and has been 4 months clean.  Gynecologic Exam The patient's pertinent negatives include no genital itching or genital odor.  Gastroesophageal Reflux She complains of belching, heartburn and a hoarse voice. This is a chronic problem. The current episode started more than 1 year ago. The problem occurs occasionally. Risk factors include obesity. She has tried an antacid for the symptoms. The treatment provided mild relief.      Review of Systems  HENT:  Positive for hoarse voice.   Gastrointestinal:  Positive for heartburn.  All other systems reviewed and are negative.  Family History  Problem Relation Age of Onset   Healthy Mother    Hypertension Father    Sleep apnea Father    Breast cancer Maternal Grandmother    Cancer Maternal Grandmother        breast   Alzheimer's disease Maternal Grandmother    Diabetes Maternal Grandfather    Cancer Paternal Grandmother        ovarian   Hypertension Paternal Grandfather    Heart disease Paternal Grandfather    Diabetes Paternal Grandfather    Hyperlipidemia Brother    Social History   Socioeconomic History   Marital status: Single    Spouse name: Not on file   Number of children: 3   Years of education: GED   Highest education level: Not on file  Occupational History   Occupation: drives fork lift/loads trucks in [employer]  Tobacco Use   Smoking status: Some Days    Types: E-cigarettes   Smokeless tobacco: Never  Vaping Use   Vaping Use: Some days  Substance and Sexual Activity   Alcohol use: No   Drug use: Not Currently     Types: Methamphetamines   Sexual activity: Not on file  Other Topics Concern   Not on file  Social History Narrative   Lives at home with fiance and son.   Right-handed.   2 cups coffee, 1 glass of sweet tea per day.   Social Determinants of Health   Financial Resource Strain: Not on file  Food Insecurity: Not on file  Transportation Needs: Not on file  Physical Activity: Not on file  Stress: Not on file  Social Connections: Not on file       Objective:   Physical Exam Vitals reviewed.  Constitutional:      General: She is not in acute distress.    Appearance: She is well-developed. She is obese.  HENT:     Head: Normocephalic and atraumatic.     Right Ear: Tympanic membrane normal.     Left Ear: Tympanic membrane normal.  Eyes:     Pupils: Pupils are equal, round, and reactive to light.  Neck:     Thyroid: No thyromegaly.  Cardiovascular:     Rate and Rhythm: Normal rate and regular rhythm.     Heart sounds: Normal heart sounds. No murmur heard. Pulmonary:     Effort: Pulmonary effort is normal. No respiratory distress.     Breath sounds: Normal breath  sounds. No wheezing.  Chest:  Breasts:    Right: No swelling, bleeding, inverted nipple, mass, nipple discharge, skin change or tenderness.     Left: No swelling, bleeding, inverted nipple, mass, nipple discharge, skin change or tenderness.  Abdominal:     General: Bowel sounds are normal. There is no distension.     Palpations: Abdomen is soft.     Tenderness: There is no abdominal tenderness.  Genitourinary:    General: Normal vulva.     Comments: Bimanual exam- no adnexal masses or tenderness, ovaries nonpalpable   Cervix parous and pink- No discharge  Musculoskeletal:        General: No tenderness. Normal range of motion.     Cervical back: Normal range of motion and neck supple.  Skin:    General: Skin is warm and dry.  Neurological:     Mental Status: She is alert and oriented to person, place, and  time.     Cranial Nerves: No cranial nerve deficit.     Deep Tendon Reflexes: Reflexes are normal and symmetric.  Psychiatric:        Behavior: Behavior normal.        Thought Content: Thought content normal.        Judgment: Judgment normal.     BP 119/72   Pulse 71   Temp (!) 97.5 F (36.4 C) (Temporal)   Ht 5' 7"$  (1.702 m)   Wt 209 lb (94.8 kg)   SpO2 94%   BMI 32.73 kg/m      Assessment & Plan:  Tyjuana Sotolongo comes in today with chief complaint of Annual Exam   Diagnosis and orders addressed:  1. Migraine without aura and with status migrainosus, not intractable - ondansetron (ZOFRAN-ODT) 4 MG disintegrating tablet; Take 1 tablet (4 mg total) by mouth every 8 (eight) hours as needed.  Dispense: 20 tablet; Refill: 11 - CMP14+EGFR - CBC with Differential/Platelet  2. Annual physical exam - CMP14+EGFR - CBC with Differential/Platelet - TSH  3. Obesity (BMI 30.0-34.9) - CMP14+EGFR - CBC with Differential/Platelet  4. Current every day smoker - CMP14+EGFR - CBC with Differential/Platelet  5. Hyperlipidemia LDL goal <130 - CMP14+EGFR - CBC with Differential/Platelet  6. Encounter for gynecological examination without abnormal finding - Cytology - PAP()   Labs pending Continue current medications  Health Maintenance reviewed Diet and exercise encouraged  Follow up plan: Keep follow up with PCP   Evelina Dun, FNP

## 2022-07-12 LAB — CMP14+EGFR
ALT: 16 IU/L (ref 0–32)
AST: 15 IU/L (ref 0–40)
Albumin/Globulin Ratio: 1.8 (ref 1.2–2.2)
Albumin: 4.4 g/dL (ref 3.9–4.9)
Alkaline Phosphatase: 83 IU/L (ref 44–121)
BUN/Creatinine Ratio: 15 (ref 9–23)
BUN: 14 mg/dL (ref 6–24)
Bilirubin Total: 0.3 mg/dL (ref 0.0–1.2)
CO2: 23 mmol/L (ref 20–29)
Calcium: 9.6 mg/dL (ref 8.7–10.2)
Chloride: 105 mmol/L (ref 96–106)
Creatinine, Ser: 0.96 mg/dL (ref 0.57–1.00)
Globulin, Total: 2.4 g/dL (ref 1.5–4.5)
Glucose: 70 mg/dL (ref 70–99)
Potassium: 4.7 mmol/L (ref 3.5–5.2)
Sodium: 143 mmol/L (ref 134–144)
Total Protein: 6.8 g/dL (ref 6.0–8.5)
eGFR: 73 mL/min/{1.73_m2} (ref >59)

## 2022-07-12 LAB — TSH: TSH: 3.07 u[IU]/mL (ref 0.450–4.500)

## 2022-07-12 LAB — CBC WITH DIFFERENTIAL/PLATELET
Basophils Absolute: 0 10*3/uL (ref 0.0–0.2)
Basos: 1 %
EOS (ABSOLUTE): 0 10*3/uL (ref 0.0–0.4)
Eos: 1 %
Hematocrit: 39.9 % (ref 34.0–46.6)
Hemoglobin: 13.8 g/dL (ref 11.1–15.9)
Immature Grans (Abs): 0 10*3/uL (ref 0.0–0.1)
Immature Granulocytes: 0 %
Lymphocytes Absolute: 1.6 10*3/uL (ref 0.7–3.1)
Lymphs: 34 %
MCH: 30.3 pg (ref 26.6–33.0)
MCHC: 34.6 g/dL (ref 31.5–35.7)
MCV: 88 fL (ref 79–97)
Monocytes Absolute: 0.4 10*3/uL (ref 0.1–0.9)
Monocytes: 8 %
Neutrophils Absolute: 2.6 10*3/uL (ref 1.4–7.0)
Neutrophils: 56 %
Platelets: 227 10*3/uL (ref 150–450)
RBC: 4.55 x10E6/uL (ref 3.77–5.28)
RDW: 12.7 % (ref 11.7–15.4)
WBC: 4.8 10*3/uL (ref 3.4–10.8)

## 2022-07-18 LAB — CYTOLOGY - PAP
Adequacy: ABSENT
Chlamydia: NEGATIVE
Comment: NEGATIVE
Comment: NEGATIVE
Comment: NEGATIVE
Comment: NORMAL
Diagnosis: NEGATIVE
HSV1: NEGATIVE
HSV2: NEGATIVE
Neisseria Gonorrhea: NEGATIVE
Trichomonas: NEGATIVE

## 2022-07-19 ENCOUNTER — Ambulatory Visit
Admission: RE | Admit: 2022-07-19 | Discharge: 2022-07-19 | Disposition: A | Discharge status: Home / Self Care | Payer: Medicaid Other | Source: Ambulatory Visit | Attending: Family Medicine | Admitting: Family Medicine

## 2022-07-19 DIAGNOSIS — Z1231 Encounter for screening mammogram for malignant neoplasm of breast: Secondary | ICD-10-CM

## 2022-07-25 ENCOUNTER — Ambulatory Visit: Payer: Medicaid Other | Admitting: Orthopedic Surgery

## 2022-07-27 ENCOUNTER — Encounter: Payer: Self-pay | Admitting: Radiology

## 2022-10-25 ENCOUNTER — Ambulatory Visit (INDEPENDENT_AMBULATORY_CARE_PROVIDER_SITE_OTHER): Payer: Medicaid Other | Admitting: Family Medicine

## 2022-10-25 ENCOUNTER — Encounter: Payer: Self-pay | Admitting: Family Medicine

## 2022-10-25 VITALS — BP 104/74 | HR 79 | Ht 67.0 in | Wt 203.0 lb

## 2022-10-25 DIAGNOSIS — E039 Hypothyroidism, unspecified: Secondary | ICD-10-CM

## 2022-10-25 DIAGNOSIS — E785 Hyperlipidemia, unspecified: Secondary | ICD-10-CM | POA: Diagnosis not present

## 2022-10-25 DIAGNOSIS — K219 Gastro-esophageal reflux disease without esophagitis: Secondary | ICD-10-CM

## 2022-10-25 DIAGNOSIS — J40 Bronchitis, not specified as acute or chronic: Secondary | ICD-10-CM | POA: Diagnosis not present

## 2022-10-25 DIAGNOSIS — F319 Bipolar disorder, unspecified: Secondary | ICD-10-CM

## 2022-10-25 DIAGNOSIS — R1310 Dysphagia, unspecified: Secondary | ICD-10-CM

## 2022-10-25 MED ORDER — IBUPROFEN 800 MG PO TABS: 800.0000 mg | ORAL_TABLET | Freq: Three times a day (TID) | ORAL | 5 refills | Status: DC | PRN

## 2022-10-25 MED ORDER — ALBUTEROL SULFATE HFA 108 (90 BASE) MCG/ACT IN AERS: INHALATION_SPRAY | RESPIRATORY_TRACT | 1 refills | Status: DC

## 2022-10-25 MED ORDER — ARIPIPRAZOLE 30 MG PO TABS: 30.0000 mg | ORAL_TABLET | Freq: Every day | ORAL | 1 refills | Status: DC

## 2022-10-25 MED ORDER — ROSUVASTATIN CALCIUM 5 MG PO TABS: 5.0000 mg | ORAL_TABLET | Freq: Every day | ORAL | 1 refills | Status: DC

## 2022-10-25 MED ORDER — OMEPRAZOLE 20 MG PO CPDR: 20.0000 mg | DELAYED_RELEASE_CAPSULE | Freq: Every day | ORAL | 3 refills | Status: DC

## 2022-10-25 NOTE — Progress Notes (Signed)
BP 104/74   Pulse 79   Ht 5\' 7"  (1.702 m)   Wt 203 lb (92.1 kg)   SpO2 96%   BMI 31.79 kg/m    Subjective:   Patient ID: Pam Chen, female    DOB: April 23, 1971, 52 y.o.   MRN: [id number]  HPI: Pam Chen is a 52 y.o. female presenting on 10/25/2022 for Medical Management of Chronic Issues   HPI Hypothyroidism recheck Patient is coming in for thyroid recheck today as well. They deny any issues with hair changes or heat or cold problems or diarrhea or constipation. They deny any chest pain or palpitations. They are currently on no medication currently, subclinical  GERD Patient is currently on none currently but starting to get some where she swallows and food gets stuck in the back of her throat and will start on something..  She denies any major symptoms or abdominal pain or belching or burping. She denies any blood in her stool or lightheadedness or dizziness.   Hyperlipidemia Patient is coming in for recheck of his hyperlipidemia. The patient is currently taking Crestor. They deny any issues with myalgias or history of liver damage from it. They deny any focal numbness or weakness or chest pain.   Sees psychiatry for history of substance abuse and for psychiatric illness.  Relevant past medical, surgical, family and social history reviewed and updated as indicated. Interim medical history since our last visit reviewed. Allergies and medications reviewed and updated.  Review of Systems  Constitutional:  Negative for chills and fever.  HENT:  Negative for congestion, ear discharge and ear pain.   Eyes:  Negative for redness and visual disturbance.  Respiratory:  Negative for chest tightness and shortness of breath.   Cardiovascular:  Negative for chest pain and leg swelling.  Genitourinary:  Negative for difficulty urinating and dysuria.  Musculoskeletal:  Negative for back pain and gait problem.  Skin:  Negative for rash.  Neurological:  Negative for light-headedness and  headaches.  Psychiatric/Behavioral:  Negative for agitation and behavioral problems.   All other systems reviewed and are negative.   Per HPI unless specifically indicated above   Allergies as of 10/25/2022       Reactions   Aspirin Other (See Comments)   Upset tummy.  Can take ibuprofen.   Oxycodone-acetaminophen Anxiety   Can take vicodin   Tramadol Hcl Nausea Only, Other (See Comments)   And headache And headache        Medication List        Accurate as of Oct 25, 2022  3:45 PM. If you have any questions, ask your nurse or doctor.          STOP taking these medications    busPIRone 7.5 MG tablet Commonly known as: BUSPAR Stopped by: [name] Murry Diaz, MD   cyclobenzaprine 10 MG tablet Commonly known as: FLEXERIL Stopped by: [name] Karam Dunson, MD       TAKE these medications    albuterol 108 (90 Base) MCG/ACT inhaler Commonly known as: VENTOLIN HFA TAKE 1-2 PUFFS AS NEEDED FOR WHEEZING/CHEST TIGHTNESS   ARIPiprazole 30 MG tablet Commonly known as: ABILIFY Take 1 tablet (30 mg total) by mouth daily.   FLUoxetine 20 MG capsule Commonly known as: PROZAC Take 3 capsules (60 mg total) by mouth daily.   fluticasone-salmeterol 250-50 MCG/ACT Aepb Commonly known as: Advair Diskus Inhale 1 puff into the lungs in the morning and at bedtime.   ibuprofen 800 MG tablet Commonly known  as: ADVIL Take 1 tablet (800 mg total) by mouth 3 (three) times daily as needed.   omeprazole 20 MG capsule Commonly known as: PRILOSEC Take 1 capsule (20 mg total) by mouth daily. Started by: [name] Batu Cassin, MD   ondansetron 4 MG disintegrating tablet Commonly known as: ZOFRAN-ODT Take 1 tablet (4 mg total) by mouth every 8 (eight) hours as needed.   oxybutynin 10 MG 24 hr tablet Commonly known as: Ditropan XL Take 1 tablet (10 mg total) by mouth at bedtime.   rizatriptan 10 MG disintegrating tablet Commonly known as: Maxalt-MLT Take 1 tablet (10 mg total) by  mouth as needed. May repeat in 2 hours if needed   rosuvastatin 5 MG tablet Commonly known as: Crestor Take 1 tablet (5 mg total) by mouth daily.         Objective:   BP 104/74   Pulse 79   Ht 5\' 7"  (1.702 m)   Wt 203 lb (92.1 kg)   SpO2 96%   BMI 31.79 kg/m   Wt Readings from Last 3 Encounters:  10/25/22 203 lb (92.1 kg)  07/11/22 209 lb (94.8 kg)  06/07/22 180 lb (81.6 kg)    Physical Exam Vitals and nursing note reviewed.  Constitutional:      General: She is not in acute distress.    Appearance: She is well-developed. She is not diaphoretic.  Eyes:     Conjunctiva/sclera: Conjunctivae normal.  Cardiovascular:     Rate and Rhythm: Normal rate and regular rhythm.     Heart sounds: Normal heart sounds. No murmur heard. Pulmonary:     Effort: Pulmonary effort is normal. No respiratory distress.     Breath sounds: Normal breath sounds. No wheezing.  Musculoskeletal:        General: No swelling or tenderness. Normal range of motion.  Skin:    General: Skin is warm and dry.     Findings: No rash.  Neurological:     Mental Status: She is alert and oriented to person, place, and time.     Coordination: Coordination normal.  Psychiatric:        Behavior: Behavior normal.       Assessment & Plan:   Problem List Items Addressed This Visit       Digestive   GERD (gastroesophageal reflux disease)   Relevant Medications   omeprazole (PRILOSEC) 20 MG capsule     Endocrine   Hypothyroidism     Other   Hyperlipidemia LDL goal <130 - Primary   Relevant Medications   rosuvastatin (CRESTOR) 5 MG tablet   Bipolar 1 disorder, depressed (HCC)   Relevant Medications   ARIPiprazole (ABILIFY) 30 MG tablet   Other Visit Diagnoses     Bronchitis       Relevant Medications   albuterol (VENTOLIN HFA) 108 (90 Base) MCG/ACT inhaler   Dysphagia, unspecified type       Relevant Medications   omeprazole (PRILOSEC) 20 MG capsule       Will start omeprazole to see  if it helps with swallowing, continue current medicine  No change in medicine Follow up plan: Return in about 6 months (around 04/27/2023), or if symptoms worsen or fail to improve, for Hypothyroidism and GERD and hyperlipidemia.  Counseling provided for all of the vaccine components No orders of the defined types were placed in this encounter.   Arville Care, MD [facility] 10/25/2022, 3:45 PM

## 2022-10-25 NOTE — Addendum Note (Signed)
Addended by: Arville Care on: 10/25/2022 03:49 PM   Modules accepted: Orders

## 2022-10-26 LAB — CMP14+EGFR
ALT: 11 IU/L (ref 0–32)
AST: 18 IU/L (ref 0–40)
Albumin/Globulin Ratio: 1.8 (ref 1.2–2.2)
Albumin: 3.8 g/dL — ABNORMAL LOW (ref 3.9–4.9)
Alkaline Phosphatase: 92 IU/L (ref 44–121)
BUN/Creatinine Ratio: 8 — ABNORMAL LOW (ref 9–23)
BUN: 9 mg/dL (ref 6–24)
Bilirubin Total: <0.2 mg/dL (ref 0.0–1.2)
CO2: 28 mmol/L (ref 20–29)
Calcium: 9.7 mg/dL (ref 8.7–10.2)
Chloride: 100 mmol/L (ref 96–106)
Creatinine, Ser: 1.11 mg/dL — ABNORMAL HIGH (ref 0.57–1.00)
Globulin, Total: 2.1 g/dL (ref 1.5–4.5)
Glucose: 83 mg/dL (ref 70–99)
Potassium: 5.1 mmol/L (ref 3.5–5.2)
Sodium: 139 mmol/L (ref 134–144)
Total Protein: 5.9 g/dL — ABNORMAL LOW (ref 6.0–8.5)
eGFR: 61 mL/min/{1.73_m2} (ref >59)

## 2022-10-26 LAB — CBC WITH DIFFERENTIAL/PLATELET
Basophils Absolute: 0 10*3/uL (ref 0.0–0.2)
Basos: 1 %
EOS (ABSOLUTE): 0.1 10*3/uL (ref 0.0–0.4)
Eos: 2 %
Hematocrit: 41.5 % (ref 34.0–46.6)
Hemoglobin: 13.7 g/dL (ref 11.1–15.9)
Immature Grans (Abs): 0 10*3/uL (ref 0.0–0.1)
Immature Granulocytes: 0 %
Lymphocytes Absolute: 2.1 10*3/uL (ref 0.7–3.1)
Lymphs: 55 %
MCH: 29.4 pg (ref 26.6–33.0)
MCHC: 33 g/dL (ref 31.5–35.7)
MCV: 89 fL (ref 79–97)
Monocytes Absolute: 0.3 10*3/uL (ref 0.1–0.9)
Monocytes: 8 %
Neutrophils Absolute: 1.3 10*3/uL — ABNORMAL LOW (ref 1.4–7.0)
Neutrophils: 34 %
Platelets: 233 10*3/uL (ref 150–450)
RBC: 4.66 x10E6/uL (ref 3.77–5.28)
RDW: 13.2 % (ref 11.7–15.4)
WBC: 3.8 10*3/uL (ref 3.4–10.8)

## 2022-10-26 LAB — LIPID PANEL
Chol/HDL Ratio: 4.5 ratio — ABNORMAL HIGH (ref 0.0–4.4)
Cholesterol, Total: 209 mg/dL — ABNORMAL HIGH (ref 100–199)
HDL: 46 mg/dL (ref >39)
LDL Chol Calc (NIH): 141 mg/dL — ABNORMAL HIGH (ref 0–99)
Triglycerides: 120 mg/dL (ref 0–149)
VLDL Cholesterol Cal: 22 mg/dL (ref 5–40)

## 2022-10-26 LAB — TSH: TSH: 2.82 u[IU]/mL (ref 0.450–4.500)

## 2022-10-30 ENCOUNTER — Other Ambulatory Visit: Payer: Self-pay

## 2022-10-30 DIAGNOSIS — E785 Hyperlipidemia, unspecified: Secondary | ICD-10-CM

## 2022-10-30 MED ORDER — ROSUVASTATIN CALCIUM 10 MG PO TABS
10.0000 mg | ORAL_TABLET | Freq: Every day | ORAL | 3 refills | Status: DC
Start: 2022-10-30 — End: 2022-12-12

## 2022-12-12 ENCOUNTER — Encounter: Payer: Self-pay | Admitting: Family Medicine

## 2022-12-12 ENCOUNTER — Telehealth: Payer: Self-pay | Admitting: Family Medicine

## 2022-12-12 ENCOUNTER — Telehealth: Payer: MEDICAID | Admitting: Family Medicine

## 2022-12-12 DIAGNOSIS — K1379 Other lesions of oral mucosa: Secondary | ICD-10-CM

## 2022-12-12 DIAGNOSIS — J029 Acute pharyngitis, unspecified: Secondary | ICD-10-CM

## 2022-12-12 DIAGNOSIS — R0789 Other chest pain: Secondary | ICD-10-CM | POA: Diagnosis not present

## 2022-12-12 DIAGNOSIS — R490 Dysphonia: Secondary | ICD-10-CM

## 2022-12-12 DIAGNOSIS — E785 Hyperlipidemia, unspecified: Secondary | ICD-10-CM

## 2022-12-12 MED ORDER — ROSUVASTATIN CALCIUM 10 MG PO TABS: 10.0000 mg | ORAL_TABLET | Freq: Every day | ORAL | 3 refills | Status: DC

## 2022-12-12 MED ORDER — CLOTRIMAZOLE 10 MG MT TROC: 10.0000 mg | Freq: Every day | OROMUCOSAL | 0 refills | Status: AC

## 2022-12-12 MED ORDER — NYSTATIN 100000 UNIT/ML MT SUSP
5.0000 mL | Freq: Four times a day (QID) | OROMUCOSAL | 0 refills | Status: DC | PRN
Start: 2022-12-12 — End: 2022-12-29

## 2022-12-12 NOTE — Progress Notes (Addendum)
Virtual Visit via Video Note  I connected with Pam Chen on 12/12/22 at  4:30 PM EDT by a video enabled telemedicine application and verified that I am speaking with the correct person using two identifiers.  Patient Location: Home Provider Location: Office/Clinic  I discussed the limitations, risks, security, and privacy concerns of performing an evaluation and management service by video and the availability of in person appointments. I also discussed with the patient that there may be a patient responsible charge related to this service. The patient expressed understanding and agreed to proceed.  Subjective: PCP: [name], [name], MD  Chief Complaint  Patient presents with   Hoarse   HPI Started 2 nights ago. States that she has light ache "around her heart" states that her BP and pulse are higher than normal. States that when she woke up without a voice yesterday.  Sore throat on swallowing. States that the inside of her mouth feels raw. She has history of thrush and this feels similar. She does not see any spots or patches in her mouth.. She has not tried anything for her throat. Took a melatonin last night and was able to sleep. States that she has a lot of stress and believes this is related to that. Is taking ibuprofen 800 mg q8 hours due to mouth pain. States that her nose runs constantly. Wonders if she she needs to see an ENT. Denies cough, congestion. States that it feels as if something is in her throat. Reports a history of cocaine use in her 20s/30s.  BP home reading 160/90, pulse 89. Not currently on medications for BP. In addition, states that yesterday she had chest "ache" while walking her dogs. States that she could feel her heart racing. Ache radiates to neck and shoulder. Describes it as continuous and is not relieved by anything.   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 Recently Evicted from her house Staying with family  and friends States that her fiance has recently been incarcerated and has left her responsibility of issues.   HLD Needs a new prescription of crestor. She had not picked it up before it was reshelved.   ROS: Per HPI  Current Outpatient Medications:    albuterol (VENTOLIN HFA) 108 (90 Base) MCG/ACT inhaler, TAKE 1-2 PUFFS AS NEEDED FOR WHEEZING/CHEST TIGHTNESS, Disp: 18 g, Rfl: 1   ARIPiprazole (ABILIFY) 30 MG tablet, Take 1 tablet (30 mg total) by mouth daily., Disp: 90 tablet, Rfl: 1   FLUoxetine (PROZAC) 20 MG capsule, Take 3 capsules (60 mg total) by mouth daily., Disp: 270 capsule, Rfl: 1   fluticasone-salmeterol (ADVAIR DISKUS) 250-50 MCG/ACT AEPB, Inhale 1 puff into the lungs in the morning and at bedtime., Disp: 180 each, Rfl: 1   ibuprofen (ADVIL) 800 MG tablet, Take 1 tablet (800 mg total) by mouth 3 (three) times daily as needed., Disp: 90 tablet, Rfl: 5   omeprazole (PRILOSEC) 20 MG capsule, Take 1 capsule (20 mg total) by mouth daily., Disp: 30 capsule, Rfl: 3   ondansetron (ZOFRAN-ODT) 4 MG disintegrating tablet, Take 1 tablet (4 mg total) by mouth every 8 (eight) hours as needed., Disp: 20 tablet, Rfl: 11   oxybutynin (DITROPAN XL) 10 MG 24 hr tablet, Take 1 tablet (10 mg total) by mouth at bedtime., Disp: 90 tablet, Rfl: 3   rizatriptan (MAXALT-MLT) 10 MG disintegrating tablet, Take 1 tablet (10 mg total) by mouth as needed. May repeat in  2 hours if needed, Disp: 15 tablet, Rfl: 1   rosuvastatin (CRESTOR) 10 MG tablet, Take 1 tablet (10 mg total) by mouth daily., Disp: 90 tablet, Rfl: 3  Observations/Objective: There were no vitals filed for this visit. Physical Exam Constitutional:      General: She is awake. She is not in acute distress.    Appearance: Normal appearance. She is well-developed and well-groomed. She is not ill-appearing, toxic-appearing or diaphoretic.  HENT:     Mouth/Throat:     Comments: Hoarse voice  Unable to adequately visualize mouth with device.  Did not visualize white patches with video exam Pulmonary:     Effort: Pulmonary effort is normal.  Neurological:     General: No focal deficit present.     Mental Status: She is alert and oriented to person, place, and time.  Psychiatric:        Attention and Perception: Attention and perception normal.        Mood and Affect: Mood and affect normal.        Speech: Speech normal.        Behavior: Behavior normal. Behavior is cooperative.        Thought Content: Thought content normal.        Cognition and Memory: Cognition and memory normal.        Judgment: Judgment normal.    Assessment and Plan: 1. Other chest pain Discussed with patient that given BP at home has been elevated and she is having chest pain, recommend that she present to ED for evaluation. Discussed with pt that symptoms of an MI can be vague and recommend she rule out acute issue.   2. Sore throat Medication as below. Reiterated cleaning mouth after use of MDIs.  - magic mouthwash (nystatin, diphenhydrAMINE, alum & mag hydroxide) suspension mixture; Swish and spit 5 mLs 4 (four) times daily as needed for mouth pain.  Dispense: 240 mL; Refill: 0  3. Hoarseness As above - magic mouthwash (nystatin, diphenhydrAMINE, alum & mag hydroxide) suspension mixture; Swish and spit 5 mLs 4 (four) times daily as needed for mouth pain.  Dispense: 240 mL; Refill: 0  4. Sore mouth As above   5. Hyperlipidemia LDL goal <130 Will resend medication as below for patient to pick up.  - rosuvastatin (CRESTOR) 10 MG tablet; Take 1 tablet (10 mg total) by mouth daily.  Dispense: 90 tablet; Refill: 3  Time spent on video call 14:38 minutes   Follow Up Instructions: Return if symptoms worsen or fail to improve.   I discussed the assessment and treatment plan with the patient. The patient was provided an opportunity to ask questions, and all were answered. The patient agreed with the plan and demonstrated an understanding of the  instructions.   The patient was advised to call back or seek an in-person evaluation if the symptoms worsen or if the condition fails to improve as anticipated.  The above assessment and management plan was discussed with the patient. The patient verbalized understanding of and has agreed to the management plan.   Neale Burly, DNP-FNP [facility] [address] [place], [place] [address] [phone]

## 2022-12-12 NOTE — Telephone Encounter (Signed)
Patient notified

## 2022-12-20 ENCOUNTER — Telehealth: Payer: Self-pay

## 2022-12-20 NOTE — Transitions of Care (Post Inpatient/ED Visit) (Unsigned)
   12/20/2022  Name: Pam Chen MRN: [id number] DOB: 22-Nov-1970  Today's TOC FU Call Status: Today's TOC FU Call Status:: Unsuccessul Call (1st Attempt) Unsuccessful Call (1st Attempt) Date: 12/20/22  Attempted to reach the patient regarding the most recent Inpatient/ED visit.  Follow Up Plan: Additional outreach attempts will be made to reach the patient to complete the Transitions of Care (Post Inpatient/ED visit) call.   Signature Karena Addison, LPN [facility] Nurse Health Advisor Direct Dial [phone]

## 2022-12-21 NOTE — Transitions of Care (Post Inpatient/ED Visit) (Signed)
12/21/2022  Name: Pam Chen MRN: [id number] DOB: May 31, 1970  Today's TOC FU Call Status: Today's TOC FU Call Status:: Successful TOC FU Call Competed Unsuccessful Call (1st Attempt) Date: 12/20/22 [facility] FU Call Complete Date: 12/21/22  Transition Care Management Follow-up Telephone Call Date of Discharge: 12/19/22 Discharge Facility: Other [facility]) Name of Other ([facility]) Discharge Facility: [facility] Type of Discharge: Emergency Department Reason for ED Visit: Other: (constipation) How have you been since you were released from the hospital?: Better Any questions or concerns?: No  Items Reviewed: Did you receive and understand the discharge instructions provided?: Yes Medications obtained,verified, and reconciled?: Yes (Medications Reviewed) Any new allergies since your discharge?: No Dietary orders reviewed?: Yes Do you have support at home?: No  Medications Reviewed Today: Medications Reviewed Today     Reviewed by Karena Addison, LPN (Licensed Practical Nurse) on 12/21/22 at 1509  Med List Status: <None>   Medication Order Taking? Sig Documenting Provider Last Dose Status Informant  albuterol (VENTOLIN HFA) 108 (90 Base) MCG/ACT inhaler [id number]  TAKE 1-2 PUFFS AS NEEDED FOR WHEEZING/CHEST TIGHTNESS [name], [name], MD  Active   ARIPiprazole (ABILIFY) 30 MG tablet [id number]  Take 1 tablet (30 mg total) by mouth daily. [name], [name], MD  Active   FLUoxetine (PROZAC) 20 MG capsule [id number] No Take 3 capsules (60 mg total) by mouth daily. [name], [name], MD Taking Active   fluticasone-salmeterol (ADVAIR DISKUS) 250-50 MCG/ACT AEPB [id number] No Inhale 1 puff into the lungs in the morning and at bedtime. [name], [name], MD Taking Active   ibuprofen (ADVIL) 800 MG tablet [id number]  Take 1 tablet (800 mg total) by mouth 3 (three) times daily as needed. [name], [name], MD  Active   magic mouthwash (nystatin, diphenhydrAMINE, alum & mag  hydroxide) suspension mixture [id number]  Swish and spit 5 mLs 4 (four) times daily as needed for mouth pain. Arrie Senate, FNP  Active   omeprazole (PRILOSEC) 20 MG capsule [id number]  Take 1 capsule (20 mg total) by mouth daily. [name], [name], MD  Active   ondansetron (ZOFRAN-ODT) 4 MG disintegrating tablet [id number] No Take 1 tablet (4 mg total) by mouth every 8 (eight) hours as needed. Junie Spencer, FNP Taking Active   oxybutynin (DITROPAN XL) 10 MG 24 hr tablet [id number] No Take 1 tablet (10 mg total) by mouth at bedtime. [name], [name], MD Taking Active   rizatriptan (MAXALT-MLT) 10 MG disintegrating tablet [id number] No Take 1 tablet (10 mg total) by mouth as needed. May repeat in 2 hours if needed [name], [name], MD Taking Active   rosuvastatin (CRESTOR) 10 MG tablet [id number]  Take 1 tablet (10 mg total) by mouth daily. Arrie Senate, FNP  Active             Home Care and Equipment/Supplies: Were Home Health Services Ordered?: NA Any new equipment or medical supplies ordered?: NA  Functional Questionnaire: Do you need assistance with bathing/showering or dressing?: No Do you need assistance with meal preparation?: No Do you need assistance with eating?: No Do you have difficulty maintaining continence: No Do you need assistance with getting out of bed/getting out of a chair/moving?: No Do you have difficulty managing or taking your medications?: No  Follow up appointments reviewed: PCP Follow-up appointment confirmed?: Yes Date of PCP follow-up appointment?: 12/29/22 [facility]up Provider[facility] Follow-up appointment confirmed?: NA Do you need transportation to your follow-up appointment?: No Do you understand care options if  your condition(s) worsen?: Yes-patient verbalized understanding    SIGNATURE Karena Addison, LPN [facility] Nurse Health Advisor Direct Dial [phone]

## 2022-12-29 ENCOUNTER — Encounter: Payer: Self-pay | Admitting: Family Medicine

## 2022-12-29 ENCOUNTER — Ambulatory Visit: Payer: MEDICAID | Admitting: Family Medicine

## 2022-12-29 VITALS — BP 122/76 | HR 78 | Temp 97.9°F | Ht 67.0 in | Wt 189.2 lb

## 2022-12-29 DIAGNOSIS — G43001 Migraine without aura, not intractable, with status migrainosus: Secondary | ICD-10-CM | POA: Diagnosis not present

## 2022-12-29 DIAGNOSIS — R110 Nausea: Secondary | ICD-10-CM | POA: Diagnosis not present

## 2022-12-29 DIAGNOSIS — B370 Candidal stomatitis: Secondary | ICD-10-CM

## 2022-12-29 MED ORDER — FLUCONAZOLE 200 MG PO TABS
200.0000 mg | ORAL_TABLET | Freq: Every day | ORAL | 0 refills | Status: AC
Start: 2022-12-29 — End: 2023-01-05

## 2022-12-29 MED ORDER — ONDANSETRON 4 MG PO TBDP
4.0000 mg | ORAL_TABLET | Freq: Three times a day (TID) | ORAL | 0 refills | Status: AC | PRN
Start: 2022-12-29 — End: ?

## 2022-12-29 MED ORDER — KETOROLAC TROMETHAMINE 30 MG/ML IJ SOLN
30.0000 mg | Freq: Once | INTRAMUSCULAR | Status: AC
Start: 2022-12-29 — End: 2022-12-29
  Administered 2022-12-29: 30 mg via INTRAMUSCULAR

## 2022-12-29 NOTE — Progress Notes (Signed)
Subjective:  Patient ID: Pam Chen, female    DOB: December 18, 1970, 52 y.o.   MRN: [id number]  Patient Care Team: [name], [name], MD as PCP - General (Family Medicine)   Chief Complaint:  Migraine (Started this morning and has not stopped.  She also feels nauseous. ) and Thrush (X 1 1/2 weeks )   HPI: Pam Chen is a 52 y.o. female presenting on 12/29/2022 for Migraine (Started this morning and has not stopped.  She also feels nauseous. ) and Thrush (X 1 1/2 weeks )   Migraine  This is a recurrent problem. The current episode started in the past 7 days. The problem occurs constantly. The problem has been waxing and waning. The pain is located in the Left unilateral region. The pain does not radiate. The pain quality is similar to prior headaches. The quality of the pain is described as throbbing, stabbing and squeezing. The pain is moderate. Associated symptoms include nausea and vomiting. Pertinent negatives include no abdominal pain, abnormal behavior, anorexia, back pain, blurred vision, coughing, dizziness, drainage, ear pain, eye pain, eye redness, eye watering, facial sweating, fever, hearing loss, insomnia, loss of balance, muscle aches, neck pain, numbness, phonophobia, photophobia, rhinorrhea, scalp tenderness, seizures, sinus pressure, sore throat, swollen glands, tingling, tinnitus, visual change, weakness or weight loss. The symptoms are aggravated by bright light, noise and activity. She has tried NSAIDs and acetaminophen for the symptoms. The treatment provided no relief. Her past medical history is significant for migraine headaches.   Oral lesions Pt states she was treated for thrush with troches which did not resolve the symptoms. She was prescribed miracle mouth wash but this is out of stock in all pharmacies.      Relevant past medical, surgical, family, and social history reviewed and updated as indicated.  Allergies and medications reviewed and updated. Data  reviewed: Chart in Epic.   Past Medical History:  Diagnosis Date   Anxiety    Arthritis    Bipolar disorder (HCC)    Current every day smoker 03/07/2017   Depression    Diverticulitis    GERD (gastroesophageal reflux disease)    Herpes simplex type 1 infection 02/13/2019   Herpes simplex type 2 infection 02/13/2019   History of adenomatous polyp of colon 05/02/2016   Adenoma 05/02/16; repeat 04/2021; Barbaraann Share, MD [facility])   History of bipolar disorder 08/25/2019   History of partial colectomy 03/28/2016   diverticulitis   Hypertension    Hypothyroid 02/02/2017   Kidney stones    Migraine    Obesity (BMI 30.0-34.9) 02/02/2017   Polysubstance dependence including opioid type drug with complication, episodic abuse (HCC) 08/25/2019   Rotator cuff injury    Sleep apnea    CPAP   Tennis elbow    Thyroid disease     Past Surgical History:  Procedure Laterality Date   ABDOMINAL HYSTERECTOMY     COLON SURGERY     diverticulitis   LITHOTRIPSY     SHOULDER ARTHROSCOPY WITH DISTAL CLAVICLE RESECTION Left 12/08/2021   Procedure: SHOULDER ARTHROSCOPY WITH DISTAL CLAVICLE RESECTION;  Surgeon: Oliver Barre, MD;  Location: [facility];  Service: Orthopedics;  Laterality: Left;   SLEEVE GASTROPLASTY      Social History   Socioeconomic History   Marital status: Single    Spouse name: Not on file   Number of children: 3   Years of education: GED   Highest education level: Not on file  Occupational History  Occupation: drives fork lift/loads trucks in [employer]  Tobacco Use   Smoking status: Some Days    Types: E-cigarettes   Smokeless tobacco: Never  Vaping Use   Vaping status: Some Days  Substance and Sexual Activity   Alcohol use: No   Drug use: Not Currently    Types: Methamphetamines   Sexual activity: Not on file  Other Topics Concern   Not on file  Social History Narrative   Lives at home with fiance and son.   Right-handed.   2 cups coffee, 1 glass of sweet  tea per day.   Social Determinants of Health   Financial Resource Strain: Not on file  Food Insecurity: Not on file  Transportation Needs: Not on file  Physical Activity: Not on file  Stress: Stress Concern Present (11/11/2021)   Received from [facility], [facility]   [employer] [facility] - Occupational Stress Questionnaire    Feeling of Stress : To some extent  Social Connections: Unknown (09/28/2021)   Received from [facility], [facility]   Social Network    Social Network: Not on file  Intimate Partner Violence: Not At Risk (12/18/2022)   Received from [facility]   HITS    Over the last 12 months how often did your partner physically hurt you?: 1    Over the last 12 months how often did your partner insult you or talk down to you?: 1    Over the last 12 months how often did your partner threaten you with physical harm?: 1    Over the last 12 months how often did your partner scream or curse at you?: 1    Outpatient Encounter Medications as of 12/29/2022  Medication Sig   albuterol (VENTOLIN HFA) 108 (90 Base) MCG/ACT inhaler TAKE 1-2 PUFFS AS NEEDED FOR WHEEZING/CHEST TIGHTNESS   ARIPiprazole (ABILIFY) 30 MG tablet Take 1 tablet (30 mg total) by mouth daily.   fluconazole (DIFLUCAN) 200 MG tablet Take 1 tablet (200 mg total) by mouth daily for 7 days.   FLUoxetine (PROZAC) 20 MG capsule Take 3 capsules (60 mg total) by mouth daily.   fluticasone-salmeterol (ADVAIR DISKUS) 250-50 MCG/ACT AEPB Inhale 1 puff into the lungs in the morning and at bedtime.   ibuprofen (ADVIL) 800 MG tablet Take 1 tablet (800 mg total) by mouth 3 (three) times daily as needed.   omeprazole (PRILOSEC) 20 MG capsule Take 1 capsule (20 mg total) by mouth daily.   oxybutynin (DITROPAN XL) 10 MG 24 hr tablet Take 1 tablet (10 mg total) by mouth at bedtime.   rosuvastatin (CRESTOR) 10 MG tablet Take 1 tablet (10 mg total) by mouth daily.   [DISCONTINUED] ondansetron (ZOFRAN-ODT)  4 MG disintegrating tablet Take 1 tablet (4 mg total) by mouth every 8 (eight) hours as needed.   ondansetron (ZOFRAN-ODT) 4 MG disintegrating tablet Take 1 tablet (4 mg total) by mouth every 8 (eight) hours as needed.   rizatriptan (MAXALT-MLT) 10 MG disintegrating tablet Take 1 tablet (10 mg total) by mouth as needed. May repeat in 2 hours if needed (Patient not taking: Reported on 12/29/2022)   [DISCONTINUED] magic mouthwash (nystatin, diphenhydrAMINE, alum & mag hydroxide) suspension mixture Swish and spit 5 mLs 4 (four) times daily as needed for mouth pain.   [EXPIRED] ketorolac (TORADOL) 30 MG/ML injection 30 mg    No facility-administered encounter medications on file as of 12/29/2022.    Allergies  Allergen Reactions   Aspirin Other (See Comments)  Upset tummy.  Can take ibuprofen.   Oxycodone-Acetaminophen Anxiety    Can take vicodin   Tramadol Hcl Nausea Only and Other (See Comments)    And headache And headache     Review of Systems  Constitutional:  Negative for activity change, appetite change, chills, diaphoresis, fatigue, fever, unexpected weight change and weight loss.  HENT:  Positive for mouth sores. Negative for ear pain, hearing loss, rhinorrhea, sinus pressure, sore throat and tinnitus.   Eyes: Negative.  Negative for blurred vision, photophobia, pain, redness and visual disturbance.  Respiratory:  Negative for cough, chest tightness and shortness of breath.   Cardiovascular:  Negative for chest pain, palpitations and leg swelling.  Gastrointestinal:  Positive for nausea and vomiting. Negative for abdominal distention, abdominal pain, anal bleeding, anorexia, blood in stool, constipation, diarrhea and rectal pain.  Endocrine: Negative.  Negative for polydipsia, polyphagia and polyuria.  Genitourinary:  Negative for decreased urine volume, difficulty urinating, dysuria, frequency and urgency.  Musculoskeletal:  Negative for arthralgias, back pain, myalgias and neck  pain.  Skin: Negative.   Allergic/Immunologic: Negative.   Neurological:  Positive for headaches. Negative for dizziness, tingling, tremors, seizures, syncope, facial asymmetry, speech difficulty, weakness, light-headedness, numbness and loss of balance.  Hematological: Negative.   Psychiatric/Behavioral:  Negative for confusion, hallucinations, sleep disturbance and suicidal ideas. The patient does not have insomnia.   All other systems reviewed and are negative.       Objective:  BP 122/76   Pulse 78   Temp 97.9 F (36.6 C) (Temporal)   Ht 5\' 7"  (1.702 m)   Wt 189 lb 3.2 oz (85.8 kg)   SpO2 99%   BMI 29.63 kg/m    Wt Readings from Last 3 Encounters:  12/29/22 189 lb 3.2 oz (85.8 kg)  10/25/22 203 lb (92.1 kg)  07/11/22 209 lb (94.8 kg)    Physical Exam Vitals and nursing note reviewed.  Constitutional:      General: She is not in acute distress.    Appearance: Normal appearance. She is well-developed and well-groomed. She is obese. She is not ill-appearing, toxic-appearing or diaphoretic.  HENT:     Head: Normocephalic and atraumatic.     Jaw: There is normal jaw occlusion.     Right Ear: Hearing, tympanic membrane, ear canal and external ear normal.     Left Ear: Hearing, tympanic membrane, ear canal and external ear normal.     Nose: Nose normal.     Mouth/Throat:     Lips: Pink.     Mouth: Mucous membranes are moist.     Dentition: Abnormal dentition. Dental caries present.     Pharynx: Oropharynx is clear. Uvula midline.     Comments: White patches to buccal spaces and to right lateral tongue.  Eyes:     General: Lids are normal.     Extraocular Movements: Extraocular movements intact.     Conjunctiva/sclera: Conjunctivae normal.     Pupils: Pupils are equal, round, and reactive to light.  Neck:     Thyroid: No thyroid mass, thyromegaly or thyroid tenderness.     Vascular: No carotid bruit or JVD.     Trachea: Trachea and phonation normal.  Cardiovascular:      Rate and Rhythm: Normal rate and regular rhythm.     Chest Wall: PMI is not displaced.     Pulses: Normal pulses.     Heart sounds: Normal heart sounds. No murmur heard.    No friction rub. No gallop.  Pulmonary:     Effort: Pulmonary effort is normal. No respiratory distress.     Breath sounds: Normal breath sounds. No wheezing.  Abdominal:     General: Bowel sounds are normal. There is no distension or abdominal bruit.     Palpations: Abdomen is soft. There is no hepatomegaly or splenomegaly.     Tenderness: There is no abdominal tenderness. There is no right CVA tenderness or left CVA tenderness.     Hernia: No hernia is present.  Musculoskeletal:        General: Normal range of motion.     Cervical back: Normal range of motion and neck supple.     Right lower leg: No edema.     Left lower leg: No edema.  Lymphadenopathy:     Cervical: No cervical adenopathy.  Skin:    General: Skin is warm and dry.     Capillary Refill: Capillary refill takes less than 2 seconds.     Coloration: Skin is not cyanotic, jaundiced or pale.     Findings: No rash.  Neurological:     General: No focal deficit present.     Mental Status: She is alert and oriented to person, place, and time.     Sensory: Sensation is intact.     Motor: Motor function is intact.     Coordination: Coordination is intact.     Gait: Gait is intact.     Deep Tendon Reflexes: Reflexes are normal and symmetric.  Psychiatric:        Attention and Perception: Attention and perception normal.        Mood and Affect: Mood and affect normal.        Speech: Speech normal.        Behavior: Behavior normal. Behavior is cooperative.        Thought Content: Thought content normal.        Cognition and Memory: Cognition and memory normal.        Judgment: Judgment normal.     Results for orders placed or performed in visit on 10/25/22  CBC with Differential/Platelet  Result Value Ref Range   WBC 3.8 3.4 - 10.8 x10E3/uL    RBC 4.66 3.77 - 5.28 x10E6/uL   Hemoglobin 13.7 11.1 - 15.9 g/dL   Hematocrit 16.1 09.6 - 46.6 %   MCV 89 79 - 97 fL   MCH 29.4 26.6 - 33.0 pg   MCHC 33.0 31.5 - 35.7 g/dL   RDW 04.5 40.9 - 81.1 %   Platelets 233 150 - 450 x10E3/uL   Neutrophils 34 Not Estab. %   Lymphs 55 Not Estab. %   Monocytes 8 Not Estab. %   Eos 2 Not Estab. %   Basos 1 Not Estab. %   Neutrophils Absolute 1.3 (L) 1.4 - 7.0 x10E3/uL   Lymphocytes Absolute 2.1 0.7 - 3.1 x10E3/uL   Monocytes Absolute 0.3 0.1 - 0.9 x10E3/uL   EOS (ABSOLUTE) 0.1 0.0 - 0.4 x10E3/uL   Basophils Absolute 0.0 0.0 - 0.2 x10E3/uL   Immature Granulocytes 0 Not Estab. %   Immature Grans (Abs) 0.0 0.0 - 0.1 x10E3/uL  CMP14+EGFR  Result Value Ref Range   Glucose 83 70 - 99 mg/dL   BUN 9 6 - 24 mg/dL   Creatinine, Ser 9.14 (H) 0.57 - 1.00 mg/dL   eGFR 61 >78 GN/FAO/1.30   BUN/Creatinine Ratio 8 (L) 9 - 23   Sodium 139 134 - 144 mmol/L   Potassium 5.1 3.5 -  5.2 mmol/L   Chloride 100 96 - 106 mmol/L   CO2 28 20 - 29 mmol/L   Calcium 9.7 8.7 - 10.2 mg/dL   Total Protein 5.9 (L) 6.0 - 8.5 g/dL   Albumin 3.8 (L) 3.9 - 4.9 g/dL   Globulin, Total 2.1 1.5 - 4.5 g/dL   Albumin/Globulin Ratio 1.8 1.2 - 2.2   Bilirubin Total <0.2 0.0 - 1.2 mg/dL   Alkaline Phosphatase 92 44 - 121 IU/L   AST 18 0 - 40 IU/L   ALT 11 0 - 32 IU/L  Lipid panel  Result Value Ref Range   Cholesterol, Total 209 (H) 100 - 199 mg/dL   Triglycerides 161 0 - 149 mg/dL   HDL 46 >09 mg/dL   VLDL Cholesterol Cal 22 5 - 40 mg/dL   LDL Chol Calc (NIH) 604 (H) 0 - 99 mg/dL   Chol/HDL Ratio 4.5 (H) 0.0 - 4.4 ratio  TSH  Result Value Ref Range   TSH 2.820 0.450 - 4.500 uIU/mL       Pertinent labs & imaging results that were available during my care of the patient were reviewed by me and considered in my medical decision making.  Assessment & Plan:  Jacari was seen today for migraine and thrush.  Diagnoses and all orders for this visit:  Migraine without aura  and with status migrainosus, not intractable Aware she needs to see neurology as discussed with her by her PCP. Will treat with toradol injection today. Refill of PRN zofran provided. Report new, worsening, or persistent symptoms.  -     ondansetron (ZOFRAN-ODT) 4 MG disintegrating tablet; Take 1 tablet (4 mg total) by mouth every 8 (eight) hours as needed. -     ketorolac (TORADOL) 30 MG/ML injection 30 mg  Thrush Recurrent in nature, will treat with below. Pt aware if this persists she needs to follow up with PCP for further evaluation for potential underlying causes such as diabetes or HIV.  -     fluconazole (DIFLUCAN) 200 MG tablet; Take 1 tablet (200 mg total) by mouth daily for 7 days.  Nausea Will refill today. Aware to follow up with neurology for further management of migraines. Dr. Zannie Cove contact information provided.  -     ondansetron (ZOFRAN-ODT) 4 MG disintegrating tablet; Take 1 tablet (4 mg total) by mouth every 8 (eight) hours as needed.     Continue all other maintenance medications.  Follow up plan: Return if symptoms worsen or fail to improve.   Continue healthy lifestyle choices, including diet (rich in fruits, vegetables, and lean proteins, and low in salt and simple carbohydrates) and exercise (at least 30 minutes of moderate physical activity daily).  Educational handout given for oral thrush  The above assessment and management plan was discussed with the patient. The patient verbalized understanding of and has agreed to the management plan. Patient is aware to call the clinic if they develop any new symptoms or if symptoms persist or worsen. Patient is aware when to return to the clinic for a follow-up visit. Patient educated on when it is appropriate to go to the emergency department.   Kari Baars, FNP-C [facility] [phone]

## 2023-01-11 ENCOUNTER — Telehealth: Payer: Self-pay | Admitting: Family Medicine

## 2023-01-11 DIAGNOSIS — F319 Bipolar disorder, unspecified: Secondary | ICD-10-CM

## 2023-01-11 DIAGNOSIS — B370 Candidal stomatitis: Secondary | ICD-10-CM

## 2023-01-11 DIAGNOSIS — J029 Acute pharyngitis, unspecified: Secondary | ICD-10-CM

## 2023-01-11 MED ORDER — FLUOXETINE HCL 20 MG PO CAPS: 60.0000 mg | ORAL_CAPSULE | Freq: Every day | ORAL | 0 refills | Status: DC

## 2023-01-11 MED ORDER — ARIPIPRAZOLE 30 MG PO TABS
30.0000 mg | ORAL_TABLET | Freq: Every day | ORAL | 0 refills | Status: DC
Start: 2023-01-11 — End: 2023-01-18

## 2023-01-11 NOTE — Telephone Encounter (Signed)
  Prescription Request  01/11/2023  Is this a "Controlled Substance" medicine?   Have you seen your PCP in the last 2 weeks? Made appt for 8/22, patient stated that she will be out on 8/20   If YES, route message to pool  -  If NO, patient needs to be scheduled for appointment.  What is the name of the medication or equipment? ARIPiprazole (ABILIFY) 30 MG tablet  FLUoxetine (PROZAC) 20 MG capsule  Have you contacted your pharmacy to request a refill? Yes    Which pharmacy would you like this sent to? [facility] in [place]    Patient notified that their request is being sent to the clinical staff for review and that they should receive a response within 2 business days.

## 2023-01-11 NOTE — Telephone Encounter (Signed)
Patient was seen on 12/29/22 and was prescribed diflucan, stated that she feels like she has blisters and sores in mouth again and throat is sore. Feels like she has an ear infection, ear is sore and glands in throat are swollen. She is unsure if she needs to come back in to be reevaluated or if another round of medications can be called in. Said that the medication seemed to help for a couple of days and then it started coming back.    Has appt with PCP on 8/22 for medication follow up. Pease call back and advise.   Call patient at [phone] because her phone is out and is unable to receive calls at this time.

## 2023-01-11 NOTE — Telephone Encounter (Signed)
Aware refill sent to pharmacy ?

## 2023-01-11 NOTE — Telephone Encounter (Signed)
Pt would like to be seen and request ent referral as well.  Appt made with Gennette Pac on 8/16. ENT referral placed. Pt aware.

## 2023-01-11 NOTE — Telephone Encounter (Signed)
Do you want to see pt in the office again?

## 2023-01-12 ENCOUNTER — Encounter: Payer: Self-pay | Admitting: Nurse Practitioner

## 2023-01-12 ENCOUNTER — Ambulatory Visit: Payer: MEDICAID | Admitting: Nurse Practitioner

## 2023-01-12 ENCOUNTER — Telehealth: Payer: Self-pay | Admitting: Family Medicine

## 2023-01-12 VITALS — BP 132/86 | HR 66 | Temp 97.0°F | Resp 20 | Ht 67.0 in | Wt 183.0 lb

## 2023-01-12 DIAGNOSIS — B370 Candidal stomatitis: Secondary | ICD-10-CM | POA: Diagnosis not present

## 2023-01-12 MED ORDER — NYSTATIN 100000 UNIT/ML MT SUSP
5.0000 mL | Freq: Four times a day (QID) | OROMUCOSAL | 0 refills | Status: DC
Start: 2023-01-12 — End: 2023-01-18

## 2023-01-12 MED ORDER — FLUCONAZOLE 150 MG PO TABS: 150.0000 mg | ORAL_TABLET | Freq: Every day | ORAL | 0 refills | Status: DC

## 2023-01-12 NOTE — Patient Instructions (Signed)

## 2023-01-12 NOTE — Telephone Encounter (Signed)
Left detailed message on patients voicemail that rx was changed and sent to the pharmacy

## 2023-01-12 NOTE — Telephone Encounter (Signed)
Nystatin changed to diflucan Meds ordered this encounter  Medications   fluconazole (DIFLUCAN) 150 MG tablet    Sig: Take 1 tablet (150 mg total) by mouth daily. 1 po q week x 4 weeks    Dispense:  4 tablet    Refill:  0    Order Specific Question:   Supervising Provider    Answer:   Nils Pyle [[id number]]   [name], FNP

## 2023-01-12 NOTE — Progress Notes (Signed)
Subjective:    Patient ID: Pam Chen, female    DOB: 26-Jul-1970, 52 y.o.   MRN: [id number]   Chief Complaint: Ear Pain (Right ear) and inside of mouth sore   HPI  Patient in today c/o ear pain that started over a month ago. She has developed thrush again and that always makes her ear hurt. She is on advair and gets thrush frequently. She has a referral in the works for ENT, but no appointment scheduled as of yet.  Patient Active Problem List   Diagnosis Date Noted   Chronic migraine with aura 08/31/2020   Constipation 08/20/2020   Hypothyroidism 07/23/2020   Polysubstance dependence including opioid type drug with complication, episodic abuse (HCC) 08/25/2019   Insomnia 08/18/2019   Herpes simplex type 1 infection 02/13/2019   Herpes simplex type 2 infection 02/13/2019   Hyperlipidemia LDL goal <130 05/01/2017   Current every day smoker 03/07/2017   Obesity (BMI 30.0-34.9) 02/02/2017   Insomnia disorder 02/02/2017   GERD (gastroesophageal reflux disease) 02/02/2017   Migraine 02/02/2017   Obstructive sleep apnea syndrome 05/24/2016   History of adenomatous polyp of colon 05/02/2016   History of partial colectomy 03/28/2016   Bipolar 1 disorder, depressed (HCC) 01/09/2016       Review of Systems  Constitutional:  Negative for diaphoresis.  Eyes:  Negative for pain.  Respiratory:  Negative for shortness of breath.   Cardiovascular:  Negative for chest pain, palpitations and leg swelling.  Gastrointestinal:  Negative for abdominal pain.  Endocrine: Negative for polydipsia.  Skin:  Negative for rash.  Neurological:  Negative for dizziness, weakness and headaches.  Hematological:  Does not bruise/bleed easily.  All other systems reviewed and are negative.      Objective:   Physical Exam Vitals and nursing note reviewed.  Constitutional:      General: She is not in acute distress.    Appearance: Normal appearance. She is well-developed.  HENT:     Head:  Normocephalic.     Right Ear: Tympanic membrane normal.     Left Ear: Tympanic membrane normal.     Nose: Nose normal.     Mouth/Throat:     Mouth: Mucous membranes are moist.     Comments: Mild white film on tongue and buccal mucosa Eyes:     Pupils: Pupils are equal, round, and reactive to light.  Neck:     Vascular: No carotid bruit or JVD.  Cardiovascular:     Rate and Rhythm: Normal rate and regular rhythm.     Heart sounds: Normal heart sounds.  Pulmonary:     Effort: Pulmonary effort is normal. No respiratory distress.     Breath sounds: Normal breath sounds. No wheezing or rales.  Chest:     Chest wall: No tenderness.  Abdominal:     General: There is no abdominal bruit.     Palpations: There is no hepatomegaly, splenomegaly or pulsatile mass.  Musculoskeletal:        General: Normal range of motion.     Cervical back: Normal range of motion and neck supple.  Lymphadenopathy:     Cervical: No cervical adenopathy.  Skin:    General: Skin is warm and dry.  Neurological:     Mental Status: She is alert and oriented to person, place, and time.     Deep Tendon Reflexes: Reflexes are normal and symmetric.  Psychiatric:        Behavior: Behavior normal.  Thought Content: Thought content normal.        Judgment: Judgment normal.    BP 132/86   Pulse 66   Temp (!) 97 F (36.1 C) (Temporal)   Resp 20   Ht 5\' 7"  (1.702 m)   Wt 183 lb (83 kg)   SpO2 98%   BMI 28.66 kg/m         Assessment & Plan:  Pam Chen in today with chief complaint of Ear Pain (Right ear) and inside of mouth sore   1. Oral thrush Gargle with butter milk after using advair RTO prn - nystatin (MYCOSTATIN) 100000 UNIT/ML suspension; Take 5 mLs (500,000 Units total) by mouth 4 (four) times daily.  Dispense: 60 mL; Refill: 0    The above assessment and management plan was discussed with the patient. The patient verbalized understanding of and has agreed to the management plan.  Patient is aware to call the clinic if symptoms persist or worsen. Patient is aware when to return to the clinic for a follow-up visit. Patient educated on when it is appropriate to go to the emergency department.   Pam Daphine Deutscher, FNP

## 2023-01-18 ENCOUNTER — Ambulatory Visit (INDEPENDENT_AMBULATORY_CARE_PROVIDER_SITE_OTHER): Payer: MEDICAID | Admitting: Family Medicine

## 2023-01-18 ENCOUNTER — Encounter: Payer: Self-pay | Admitting: Family Medicine

## 2023-01-18 VITALS — BP 114/82 | HR 90 | Ht 67.0 in | Wt 182.0 lb

## 2023-01-18 DIAGNOSIS — F319 Bipolar disorder, unspecified: Secondary | ICD-10-CM | POA: Diagnosis not present

## 2023-01-18 DIAGNOSIS — N3281 Overactive bladder: Secondary | ICD-10-CM | POA: Diagnosis not present

## 2023-01-18 DIAGNOSIS — E039 Hypothyroidism, unspecified: Secondary | ICD-10-CM

## 2023-01-18 DIAGNOSIS — Z23 Encounter for immunization: Secondary | ICD-10-CM | POA: Diagnosis not present

## 2023-01-18 DIAGNOSIS — E785 Hyperlipidemia, unspecified: Secondary | ICD-10-CM

## 2023-01-18 MED ORDER — ROSUVASTATIN CALCIUM 10 MG PO TABS: 10.0000 mg | ORAL_TABLET | Freq: Every day | ORAL | 3 refills | Status: AC

## 2023-01-18 MED ORDER — MIRABEGRON ER 50 MG PO TB24
50.0000 mg | ORAL_TABLET | Freq: Every day | ORAL | 5 refills | Status: DC
Start: 2023-01-18 — End: 2023-06-01

## 2023-01-18 MED ORDER — ARIPIPRAZOLE 30 MG PO TABS: 30.0000 mg | ORAL_TABLET | Freq: Every day | ORAL | 3 refills | Status: AC

## 2023-01-18 MED ORDER — FLUOXETINE HCL 20 MG PO CAPS
60.0000 mg | ORAL_CAPSULE | Freq: Every day | ORAL | 3 refills | Status: AC
Start: 2023-01-18 — End: ?

## 2023-01-18 NOTE — Addendum Note (Signed)
Addended by: Dorene Sorrow on: 01/18/2023 10:34 AM   Modules accepted: Orders

## 2023-01-18 NOTE — Progress Notes (Signed)
BP 114/82   Pulse 90   Ht 5\' 7"  (1.702 m)   Wt 182 lb (82.6 kg)   SpO2 98%   BMI 28.51 kg/m    Subjective:   Patient ID: Pam Chen, female    DOB: 1970/12/27, 52 y.o.   MRN: [id number]  HPI: Pam Chen is a 52 y.o. female presenting on 01/18/2023 for Medical Management of Chronic Issues and Hyperlipidemia   HPI Hyperlipidemia Patient is coming in for recheck of his hyperlipidemia. The patient is currently taking Crestor although she admits she has not been taking this consistently and has been skipping it quite a bit.. They deny any issues with myalgias or history of liver damage from it. They deny any focal numbness or weakness or chest pain.   Depression and bipolar recheck Patient is coming in for depression anxiety and bipolar recheck.  She is currently taking Abilify and Prozac and feels like they do well for her.  She denies any suicidal ideations.  She feels like she is pretty stable with where she is out.    01/18/2023    9:22 AM 01/12/2023    8:54 AM 12/29/2022    2:28 PM 10/25/2022    3:23 PM 07/11/2022   10:47 AM  Depression screen PHQ 2/9  Decreased Interest 0 0 0 0 1  Down, Depressed, Hopeless 0 0 0 0 0  PHQ - 2 Score 0 0 0 0 1  Altered sleeping 0 1 1 0 2  Tired, decreased energy 0 0 2 0 1  Change in appetite 0 0 2 0 1  Feeling bad or failure about yourself  0 0 0 0 1  Trouble concentrating 0 0 0 0 0  Moving slowly or fidgety/restless 0 0 0 0 0  Suicidal thoughts 0 0 0 0 0  PHQ-9 Score 0 1 5 0 6  Difficult doing work/chores Not difficult at all Not difficult at all Not difficult at all Not difficult at all Somewhat difficult    Overactive bladder recheck Patient fell like the Ditropan did not do as well for her.  She says the Myrbetriq did a lot better and she wants to go back to trying the Myrbetriq again.  She still has frequency and urgency leakage.  Relevant past medical, surgical, family and social history reviewed and updated as indicated. Interim  medical history since our last visit reviewed. Allergies and medications reviewed and updated.  Review of Systems  Constitutional:  Negative for chills and fever.  HENT:  Negative for congestion, ear discharge and ear pain.   Eyes:  Negative for redness and visual disturbance.  Respiratory:  Negative for chest tightness and shortness of breath.   Cardiovascular:  Negative for chest pain and leg swelling.  Gastrointestinal:  Negative for abdominal pain.  Genitourinary:  Positive for frequency and urgency. Negative for difficulty urinating and dysuria.  Musculoskeletal:  Negative for back pain and gait problem.  Skin:  Negative for rash.  Neurological:  Negative for light-headedness and headaches.  Psychiatric/Behavioral:  Positive for dysphoric mood. Negative for agitation, behavioral problems, self-injury, sleep disturbance and suicidal ideas. The patient is nervous/anxious.   All other systems reviewed and are negative.   Per HPI unless specifically indicated above   Allergies as of 01/18/2023       Reactions   Aspirin Other (See Comments)   Upset tummy.  Can take ibuprofen.   Oxycodone-acetaminophen Anxiety   Can take vicodin   Tramadol Hcl Nausea Only, Other (See  Comments)   And headache And headache        Medication List        Accurate as of January 18, 2023  9:52 AM. If you have any questions, ask your nurse or doctor.          STOP taking these medications    fluconazole 150 MG tablet Commonly known as: Diflucan Stopped by: [name] Vasiliki Smaldone   nystatin 100000 UNIT/ML suspension Commonly known as: MYCOSTATIN Stopped by: [name] Kiaja Shorty   oxybutynin 10 MG 24 hr tablet Commonly known as: Ditropan XL Stopped by: [name] Abi Shoults   rizatriptan 10 MG disintegrating tablet Commonly known as: Maxalt-MLT Stopped by: [name] Corderro Koloski       TAKE these medications    albuterol 108 (90 Base) MCG/ACT inhaler Commonly known as: VENTOLIN HFA TAKE  1-2 PUFFS AS NEEDED FOR WHEEZING/CHEST TIGHTNESS   ARIPiprazole 30 MG tablet Commonly known as: ABILIFY Take 1 tablet (30 mg total) by mouth daily.   FLUoxetine 20 MG capsule Commonly known as: PROZAC Take 3 capsules (60 mg total) by mouth daily.   fluticasone-salmeterol 250-50 MCG/ACT Aepb Commonly known as: Advair Diskus Inhale 1 puff into the lungs in the morning and at bedtime.   ibuprofen 800 MG tablet Commonly known as: ADVIL Take 1 tablet (800 mg total) by mouth 3 (three) times daily as needed.   mirabegron ER 50 MG Tb24 tablet Commonly known as: Myrbetriq Take 1 tablet (50 mg total) by mouth daily. Started by: [name] Clent Damore   omeprazole 20 MG capsule Commonly known as: PRILOSEC Take 1 capsule (20 mg total) by mouth daily.   ondansetron 4 MG disintegrating tablet Commonly known as: ZOFRAN-ODT Take 1 tablet (4 mg total) by mouth every 8 (eight) hours as needed.   rosuvastatin 10 MG tablet Commonly known as: Crestor Take 1 tablet (10 mg total) by mouth daily.         Objective:   BP 114/82   Pulse 90   Ht 5\' 7"  (1.702 m)   Wt 182 lb (82.6 kg)   SpO2 98%   BMI 28.51 kg/m   Wt Readings from Last 3 Encounters:  01/18/23 182 lb (82.6 kg)  01/12/23 183 lb (83 kg)  12/29/22 189 lb 3.2 oz (85.8 kg)    Physical Exam Vitals and nursing note reviewed.  Constitutional:      General: She is not in acute distress.    Appearance: She is well-developed. She is not diaphoretic.  Eyes:     Conjunctiva/sclera: Conjunctivae normal.  Cardiovascular:     Rate and Rhythm: Normal rate and regular rhythm.     Heart sounds: Normal heart sounds. No murmur heard. Pulmonary:     Effort: Pulmonary effort is normal. No respiratory distress.     Breath sounds: Normal breath sounds. No wheezing.  Musculoskeletal:        General: No swelling or tenderness. Normal range of motion.  Skin:    General: Skin is warm and dry.     Findings: No rash.  Neurological:      Mental Status: She is alert and oriented to person, place, and time.     Coordination: Coordination normal.  Psychiatric:        Behavior: Behavior normal.       Assessment & Plan:   Problem List Items Addressed This Visit       Endocrine   Hypothyroidism   Relevant Orders   CMP14+EGFR   TSH  Genitourinary   OAB (overactive bladder)   Relevant Medications   mirabegron ER (MYRBETRIQ) 50 MG TB24 tablet     Other   Hyperlipidemia LDL goal <130 - Primary   Relevant Medications   rosuvastatin (CRESTOR) 10 MG tablet   Other Relevant Orders   CMP14+EGFR   Lipid panel   Bipolar 1 disorder, depressed (HCC)   Relevant Medications   ARIPiprazole (ABILIFY) 30 MG tablet   FLUoxetine (PROZAC) 20 MG capsule   Other Relevant Orders   CMP14+EGFR   TSH    Seems to be doing pretty well for the most part, continue current medicine except for we will switch her back to her Myrbetriq for her bladder.  Will restart Crestor as well. Follow up plan: Return in about 6 months (around 07/21/2023), or if symptoms worsen or fail to improve, for Hyperlipidemia recheck.  Counseling provided for all of the vaccine components Orders Placed This Encounter  Procedures   CMP14+EGFR   TSH   Lipid panel    Arville Care, MD [facility] 01/18/2023, 9:52 AM

## 2023-01-19 LAB — CMP14+EGFR
ALT: 10 IU/L (ref 0–32)
AST: 13 IU/L (ref 0–40)
Albumin: 4.2 g/dL (ref 3.9–4.9)
Alkaline Phosphatase: 99 IU/L (ref 44–121)
BUN/Creatinine Ratio: 16 (ref 9–23)
BUN: 16 mg/dL (ref 6–24)
Bilirubin Total: <0.2 mg/dL (ref 0.0–1.2)
CO2: 27 mmol/L (ref 20–29)
Calcium: 9.9 mg/dL (ref 8.7–10.2)
Chloride: 99 mmol/L (ref 96–106)
Creatinine, Ser: 0.98 mg/dL (ref 0.57–1.00)
Globulin, Total: 2.7 g/dL (ref 1.5–4.5)
Glucose: 96 mg/dL (ref 70–99)
Potassium: 5.4 mmol/L — ABNORMAL HIGH (ref 3.5–5.2)
Sodium: 138 mmol/L (ref 134–144)
Total Protein: 6.9 g/dL (ref 6.0–8.5)
eGFR: 70 mL/min/{1.73_m2} (ref >59)

## 2023-01-19 LAB — LIPID PANEL
Chol/HDL Ratio: 3.7 ratio (ref 0.0–4.4)
Cholesterol, Total: 183 mg/dL (ref 100–199)
HDL: 50 mg/dL (ref >39)
LDL Chol Calc (NIH): 104 mg/dL — ABNORMAL HIGH (ref 0–99)
Triglycerides: 164 mg/dL — ABNORMAL HIGH (ref 0–149)
VLDL Cholesterol Cal: 29 mg/dL (ref 5–40)

## 2023-01-19 LAB — TSH: TSH: 3.84 u[IU]/mL (ref 0.450–4.500)

## 2023-05-18 ENCOUNTER — Telehealth: Payer: Self-pay | Admitting: Family Medicine

## 2023-05-18 DIAGNOSIS — R1310 Dysphagia, unspecified: Secondary | ICD-10-CM

## 2023-05-18 NOTE — Telephone Encounter (Signed)
  Prescription Request  05/18/2023   What is the name of the medication or equipment? OMEPRAZOLE  Have you contacted your pharmacy to request a refill? YES  Which pharmacy would you like this sent to? [facility]  Pt was last seen for chronic follow up in Aug 2024 and per PCP notes, pt is not due to come back until Feb 2025 which she is already scheduled for. Pt is out of this med and needs refills.

## 2023-05-21 MED ORDER — OMEPRAZOLE 20 MG PO CPDR
20.0000 mg | DELAYED_RELEASE_CAPSULE | Freq: Every day | ORAL | 3 refills | Status: DC
Start: 2023-05-21 — End: 2023-08-16

## 2023-05-21 NOTE — Telephone Encounter (Signed)
Refills sent to pharmacy. Patient notified and verbalized understanding

## 2023-05-31 ENCOUNTER — Ambulatory Visit: Payer: Self-pay | Admitting: Family Medicine

## 2023-05-31 NOTE — Telephone Encounter (Signed)
 Copied from CRM [id number]. Topic: Appointments - Appointment Scheduling >> May 31, 2023 11:38 AM Rodell CROME wrote: Patient/patient representative is calling to schedule an appointment. Refer to attachments for appointment information.   Chief Complaint:  Right Arm and Hand has been going numb, Legs  foot and feet are swollen  very bad , hands are swollen  because she can't get her rings off Symptoms: Numbness and tingling  Right Arm and hand-  Hand swollen can't get rings off.  Both Legs and feet are swollen Frequency: Started a week ago  but every day Pertinent Negatives: Patient denies  chest pain or shortness of breath Disposition: [] ED /[] Urgent Care (no appt availability in office) / [] Appointment(In office/virtual)/ [x]  [facility]/ [] Home Care/ [] Refused Recommended Disposition /[] [facility]/ []  Follow-up with PCP Additional Notes: Declined  Urgent Care. Reason for Disposition  [1] Numbness (i.e., loss of sensation) of the face, arm / hand, or leg / foot on one side of the body AND [2] gradual onset (e.g., days to weeks) AND [3] present now  [1] MODERATE leg swelling (e.g., swelling extends up to knees) AND [2] new-onset or worsening  Answer Assessment - Initial Assessment Questions 1. ONSET: When did the swelling start? (e.g., minutes, hours, days)      5 days  ago  2. LOCATION: What part of the leg is swollen?  Are both legs swollen or just one leg?      Both  Legs and feet are swollen .   This has happened before, however, not this bad since the last 2-3 years ago 3. SEVERITY: How bad is the swelling? (e.g., localized; mild, moderate, severe)    - MODERATE edema: Swelling of lower leg to knee, pitting edema > 1/4 inch (6 mm) deep, rest and elevation only partially reduce swelling.       Moderate - up to calf and knee 4. REDNESS: Does the swelling look red or infected?     Denies. 5. PAIN: Is the swelling painful to touch? If Yes, ask: How painful is  it?   (Scale 1-10; mild, moderate or severe)      Denies. 6. FEVER: Do you have a fever? If Yes, ask: What is it, how was it measured, and when did it start?      Denies. 7. CAUSE: What do you think is causing the leg swelling?      Eating more  sugar lately.  Patient reports her blood Pressure has been high the last couple of times checked in  Psychiatrist  128-130 top  and bottom 92-93 8. MEDICAL HISTORY: Do you have a history of blood clots (e.g., DVT), cancer, heart failure, kidney disease, or liver failure?      Denies  All. History of Arthritis and Bursitis. 9. RECURRENT SYMPTOM: Have you had leg swelling before? If Yes, ask: When was the last time? What happened that time?      Yes a couple of years  ago  10. OTHER SYMPTOMS: Do you have any other symptoms? (e.g., chest pain, difficulty breathing)        Denies chest pain . Denies difficulty breathing 11. PREGNANCY: Is there any chance you are pregnant? When was your last menstrual period?        Hysterecotmy  Protocols used: Neurologic Deficit-A-AH, Leg Swelling and Edema-A-AH

## 2023-06-01 ENCOUNTER — Ambulatory Visit (INDEPENDENT_AMBULATORY_CARE_PROVIDER_SITE_OTHER): Payer: MEDICAID | Admitting: Nurse Practitioner

## 2023-06-01 ENCOUNTER — Encounter: Payer: Self-pay | Admitting: Nurse Practitioner

## 2023-06-01 VITALS — BP 121/73 | HR 68 | Temp 96.4°F | Resp 20 | Ht 67.0 in | Wt 223.0 lb

## 2023-06-01 DIAGNOSIS — Z23 Encounter for immunization: Secondary | ICD-10-CM

## 2023-06-01 DIAGNOSIS — J40 Bronchitis, not specified as acute or chronic: Secondary | ICD-10-CM | POA: Diagnosis not present

## 2023-06-01 DIAGNOSIS — N3281 Overactive bladder: Secondary | ICD-10-CM

## 2023-06-01 DIAGNOSIS — R600 Localized edema: Secondary | ICD-10-CM | POA: Diagnosis not present

## 2023-06-01 MED ORDER — ALBUTEROL SULFATE HFA 108 (90 BASE) MCG/ACT IN AERS
INHALATION_SPRAY | RESPIRATORY_TRACT | 1 refills | Status: AC
Start: 2023-06-01 — End: ?

## 2023-06-01 MED ORDER — FUROSEMIDE 20 MG PO TABS: 20.0000 mg | ORAL_TABLET | Freq: Every day | ORAL | 3 refills | Status: DC

## 2023-06-01 MED ORDER — MIRABEGRON ER 50 MG PO TB24
50.0000 mg | ORAL_TABLET | Freq: Every day | ORAL | 5 refills | Status: AC
Start: 2023-06-01 — End: ?

## 2023-06-01 NOTE — Patient Instructions (Signed)
 Peripheral Edema  Peripheral edema is swelling that is caused by a buildup of fluid. Peripheral edema most often affects the lower legs, ankles, and feet. It can also develop in the arms, hands, and face. The area of the body that has peripheral edema will look swollen. It may also feel heavy or warm. Your clothes may start to feel tight. Pressing on the area may make a temporary dent in your skin (pitting edema). You may not be able to move your swollen arm or leg as much as usual. There are many causes of peripheral edema. It can happen because of a complication of other conditions such as heart failure, kidney disease, or a problem with your circulation. It also can be a side effect of certain medicines or happen because of an infection. It often happens to women during pregnancy. Sometimes, the cause is not known. Follow these instructions at home: Managing pain, stiffness, and swelling  Raise (elevate) your legs while you are sitting or lying down. Move around often to prevent stiffness and to reduce swelling. Do not sit or stand for long periods of time. Do not wear tight clothing. Do not wear garters on your upper legs. Exercise your legs to get your circulation going. This helps to move the fluid back into your blood vessels, and it may help the swelling go down. Wear compression stockings as told by your health care provider. These stockings help to prevent blood clots and reduce swelling in your legs. It is important that these are the correct size. These stockings should be prescribed by your doctor to prevent possible injuries. If elastic bandages or wraps are recommended, use them as told by your health care provider. Medicines Take over-the-counter and prescription medicines only as told by your health care provider. Your health care provider may prescribe medicine to help your body get rid of excess water (diuretic). Take this medicine if you are told to take it. General  instructions Eat a low-salt (low-sodium) diet as told by your health care provider. Sometimes, eating less salt may reduce swelling. Pay attention to any changes in your symptoms. Moisturize your skin daily to help prevent skin from cracking and draining. Keep all follow-up visits. This is important. Contact a health care provider if: You have a fever. You have swelling in only one leg. You have increased swelling, redness, or pain in one or both of your legs. You have drainage or sores at the area where you have edema. Get help right away if: You have edema that starts suddenly or is getting worse, especially if you are pregnant or have a medical condition. You develop shortness of breath, especially when you are lying down. You have pain in your chest or abdomen. You feel weak. You feel like you will faint. These symptoms may be an emergency. Get help right away. Call 911. Do not wait to see if the symptoms will go away. Do not drive yourself to the hospital. Summary Peripheral edema is swelling that is caused by a buildup of fluid. Peripheral edema most often affects the lower legs, ankles, and feet. Move around often to prevent stiffness and to reduce swelling. Do not sit or stand for long periods of time. Pay attention to any changes in your symptoms. Contact a health care provider if you have edema that starts suddenly or is getting worse, especially if you are pregnant or have a medical condition. Get help right away if you develop shortness of breath, especially when lying down.  This information is not intended to replace advice given to you by your health care provider. Make sure you discuss any questions you have with your health care provider. Document Revised: 01/17/2021 Document Reviewed: 01/17/2021 Elsevier Patient Education  2024 [employer].

## 2023-06-01 NOTE — Addendum Note (Signed)
 Addended by: Bennie Pierini on: 06/01/2023 09:52 AM   Modules accepted: Level of Service

## 2023-06-01 NOTE — Progress Notes (Signed)
 Subjective:    Patient ID: Pam Chen, female    DOB: 1970/06/15, 53 y.o.   MRN: [id number]   Chief Complaint: Swelling all over (Gaining weight fast x 1 week/)   HPI Patient has several complaints today: - needs refill on albuterol . She uses it at least 1x a week. She vapes daily. - needs refill on myrbetriq - helps with her urinary urgency. - Patient c/o swelling all over. Started several weeks ago. Was just in legs but now in both hands. Her hands go numb when they swell. The lower ext swelling does not go away. She has also had weight gain. Wt Readings from Last 3 Encounters:  06/01/23 223 lb (101.2 kg)  01/18/23 182 lb (82.6 kg)  01/12/23 183 lb (83 kg)  '  Patient Active Problem List   Diagnosis Date Noted   OAB (overactive bladder) 01/18/2023   Chronic migraine with aura 08/31/2020   Constipation 08/20/2020   Hypothyroidism 07/23/2020   Polysubstance dependence including opioid type drug with complication, episodic abuse (HCC) 08/25/2019   Insomnia 08/18/2019   Herpes simplex type 1 infection 02/13/2019   Herpes simplex type 2 infection 02/13/2019   Hyperlipidemia LDL goal <130 05/01/2017   Current every day smoker 03/07/2017   Obesity (BMI 30.0-34.9) 02/02/2017   Insomnia disorder 02/02/2017   GERD (gastroesophageal reflux disease) 02/02/2017   Migraine 02/02/2017   Obstructive sleep apnea syndrome 05/24/2016   History of adenomatous polyp of colon 05/02/2016   History of partial colectomy 03/28/2016   Bipolar 1 disorder, depressed (HCC) 01/09/2016        Review of Systems  Constitutional:  Negative for diaphoresis.  Eyes:  Negative for pain.  Respiratory:  Negative for shortness of breath.   Cardiovascular:  Positive for leg swelling. Negative for chest pain and palpitations.  Gastrointestinal:  Negative for abdominal pain.  Endocrine: Negative for polydipsia.  Skin:  Negative for rash.  Neurological:  Negative for dizziness, weakness and headaches.   Hematological:  Does not bruise/bleed easily.  All other systems reviewed and are negative.      Objective:   Physical Exam Constitutional:      Appearance: Normal appearance.  Cardiovascular:     Rate and Rhythm: Normal rate and regular rhythm.     Heart sounds: Normal heart sounds.  Pulmonary:     Effort: Pulmonary effort is normal.     Breath sounds: Normal breath sounds.  Musculoskeletal:     Right lower leg: Edema (2+) present.     Left lower leg: Edema (2+) present.  Skin:    General: Skin is warm.  Neurological:     General: No focal deficit present.     Mental Status: She is alert and oriented to person, place, and time.  Psychiatric:        Behavior: Behavior normal.    BP 121/73   Pulse 68   Temp (!) 96.4 F (35.8 C) (Temporal)   Resp 20   Ht 5' 7 (1.702 m)   Wt 223 lb (101.2 kg)   SpO2 97%   BMI 34.93 kg/m         Assessment & Plan:   Lucill Mauck in today with chief complaint of Swelling all over (Gaining weight fast x 1 week/)   1. Bronchitis Vaping cessation encouraged - albuterol  (VENTOLIN  HFA) 108 (90 Base) MCG/ACT inhaler; TAKE 1-2 PUFFS AS NEEDED FOR WHEEZING/CHEST TIGHTNESS  Dispense: 18 g; Refill: 1  2. OAB (overactive bladder) - mirabegron  ER (MYRBETRIQ ) 50  MG TB24 tablet; Take 1 tablet (50 mg total) by mouth daily.  Dispense: 30 tablet; Refill: 5  3. Peripheral edema (Primary) Labs pending' start on lasix  daily Daily weights - CMP14+EGFR - CBC with Differential/Platelet - Brain natriuretic peptide - furosemide  (LASIX ) 20 MG tablet; Take 1 tablet (20 mg total) by mouth daily.  Dispense: 30 tablet; Refill: 3    The above assessment and management plan was discussed with the patient. The patient verbalized understanding of and has agreed to the management plan. Patient is aware to call the clinic if symptoms persist or worsen. Patient is aware when to return to the clinic for a follow-up visit. Patient educated on when it is  appropriate to go to the emergency department.   Mary-Margaret Gladis, FNP

## 2023-06-02 LAB — CBC WITH DIFFERENTIAL/PLATELET
Basophils Absolute: 0 10*3/uL (ref 0.0–0.2)
Basos: 0 %
EOS (ABSOLUTE): 0.1 10*3/uL (ref 0.0–0.4)
Eos: 2 %
Hematocrit: 33.2 % — ABNORMAL LOW (ref 34.0–46.6)
Hemoglobin: 10.7 g/dL — ABNORMAL LOW (ref 11.1–15.9)
Immature Grans (Abs): 0 10*3/uL (ref 0.0–0.1)
Immature Granulocytes: 1 %
Lymphocytes Absolute: 1.5 10*3/uL (ref 0.7–3.1)
Lymphs: 38 %
MCH: 27.8 pg (ref 26.6–33.0)
MCHC: 32.2 g/dL (ref 31.5–35.7)
MCV: 86 fL (ref 79–97)
Monocytes Absolute: 0.5 10*3/uL (ref 0.1–0.9)
Monocytes: 12 %
Neutrophils Absolute: 1.9 10*3/uL (ref 1.4–7.0)
Neutrophils: 47 %
Platelets: 246 10*3/uL (ref 150–450)
RBC: 3.85 x10E6/uL (ref 3.77–5.28)
RDW: 12.8 % (ref 11.7–15.4)
WBC: 3.9 10*3/uL (ref 3.4–10.8)

## 2023-06-02 LAB — CMP14+EGFR
ALT: 30 IU/L (ref 0–32)
AST: 35 IU/L (ref 0–40)
Albumin: 3.8 g/dL — ABNORMAL LOW (ref 3.9–4.9)
Alkaline Phosphatase: 81 IU/L (ref 44–121)
BUN/Creatinine Ratio: 19 (ref 9–23)
BUN: 14 mg/dL (ref 6–24)
CO2: 25 mmol/L (ref 20–29)
Calcium: 9.1 mg/dL (ref 8.7–10.2)
Chloride: 104 mmol/L (ref 96–106)
Creatinine, Ser: 0.73 mg/dL (ref 0.57–1.00)
Globulin, Total: 2.3 g/dL (ref 1.5–4.5)
Glucose: 81 mg/dL (ref 70–99)
Potassium: 4.9 mmol/L (ref 3.5–5.2)
Sodium: 143 mmol/L (ref 134–144)
Total Protein: 6.1 g/dL (ref 6.0–8.5)
eGFR: 100 mL/min/{1.73_m2} (ref >59)

## 2023-06-02 LAB — BRAIN NATRIURETIC PEPTIDE: BNP: 58.4 pg/mL (ref 0.0–100.0)

## 2023-06-05 ENCOUNTER — Other Ambulatory Visit: Payer: Self-pay | Admitting: Family Medicine

## 2023-06-05 ENCOUNTER — Telehealth: Payer: Self-pay | Admitting: Family Medicine

## 2023-06-05 DIAGNOSIS — D649 Anemia, unspecified: Secondary | ICD-10-CM

## 2023-06-05 NOTE — Telephone Encounter (Signed)
 Patient aware and verbalized understanding.

## 2023-06-05 NOTE — Telephone Encounter (Signed)
 Copied from CRM [id number]. Topic: Clinical - Lab/Test Results >> Jun 05, 2023 10:38 AM Shelah Lewandowsky wrote: Reason for CRM: Patient calling to see about lab results, swelling has not gone down, please call patient [phone]

## 2023-06-11 ENCOUNTER — Other Ambulatory Visit: Payer: MEDICAID

## 2023-06-11 DIAGNOSIS — D649 Anemia, unspecified: Secondary | ICD-10-CM

## 2023-06-11 LAB — CBC WITH DIFFERENTIAL/PLATELET
Basophils Absolute: 0 10*3/uL (ref 0.0–0.2)
Basos: 1 %
EOS (ABSOLUTE): 0.1 10*3/uL (ref 0.0–0.4)
Eos: 2 %
Hematocrit: 34.5 % (ref 34.0–46.6)
Hemoglobin: 11 g/dL — ABNORMAL LOW (ref 11.1–15.9)
Immature Grans (Abs): 0 10*3/uL (ref 0.0–0.1)
Immature Granulocytes: 0 %
Lymphocytes Absolute: 1.3 10*3/uL (ref 0.7–3.1)
Lymphs: 30 %
MCH: 27.2 pg (ref 26.6–33.0)
MCHC: 31.9 g/dL (ref 31.5–35.7)
MCV: 85 fL (ref 79–97)
Monocytes Absolute: 0.4 10*3/uL (ref 0.1–0.9)
Monocytes: 10 %
Neutrophils Absolute: 2.5 10*3/uL (ref 1.4–7.0)
Neutrophils: 57 %
Platelets: 290 10*3/uL (ref 150–450)
RBC: 4.05 x10E6/uL (ref 3.77–5.28)
RDW: 12.4 % (ref 11.7–15.4)
WBC: 4.4 10*3/uL (ref 3.4–10.8)

## 2023-06-12 LAB — FECAL OCCULT BLOOD, IMMUNOCHEMICAL: Fecal Occult Bld: NEGATIVE

## 2023-06-15 ENCOUNTER — Encounter: Payer: Self-pay | Admitting: Physician Assistant

## 2023-06-15 ENCOUNTER — Ambulatory Visit (INDEPENDENT_AMBULATORY_CARE_PROVIDER_SITE_OTHER): Payer: MEDICAID | Admitting: Physician Assistant

## 2023-06-15 ENCOUNTER — Ambulatory Visit: Payer: Self-pay | Admitting: Family Medicine

## 2023-06-15 VITALS — BP 107/69 | HR 83 | Temp 97.3°F | Resp 98 | Wt 228.0 lb

## 2023-06-15 DIAGNOSIS — R600 Localized edema: Secondary | ICD-10-CM | POA: Diagnosis not present

## 2023-06-15 DIAGNOSIS — R0602 Shortness of breath: Secondary | ICD-10-CM | POA: Diagnosis not present

## 2023-06-15 DIAGNOSIS — M5412 Radiculopathy, cervical region: Secondary | ICD-10-CM | POA: Diagnosis not present

## 2023-06-15 MED ORDER — GABAPENTIN 100 MG PO CAPS: 100.0000 mg | ORAL_CAPSULE | Freq: Three times a day (TID) | ORAL | 3 refills | Status: DC

## 2023-06-15 NOTE — Telephone Encounter (Signed)
   Chief Complaint: leg swelling Symptoms: increase swelling, right hand numbness Frequency: constant  Disposition: [] ED /[] Urgent Care (no appt availability in office) / [x] Appointment(In office/virtual)/ []  [facility]/ [] Home Care/ [] Refused Recommended Disposition /[] [facility]/ []  Follow-up with PCP Additional Notes: Pt complaining of bilateral leg swelling for 2 weeks now. It starts at feet and goes up mid-thigh. Pt also stated her right hand and arm go numb at random times while just sitting. Pt has been seen twice for this and has had labs and stool screening. Pt is waiting on results. Pt denies any redness but says skin is very tight. Per protocol, pt to be seen within 4 hours. PCP didn't have appt but pt willing to see another [facility] provider. Pt has appt at [facility] family practice at 1440 today. RN gave care advice and pt verbalized understanding.          Copied from CRM [id number]. Topic: Clinical - Red Word Triage >> Jun 15, 2023 12:08 PM Fredrica W wrote: Red Word that prompted transfer to Nurse Triage: Swelling both legs and right hand and arm go numb. Reason for Disposition  SEVERE leg swelling (e.g., swelling extends above knee, entire leg is swollen, weeping fluid)  Answer Assessment - Initial Assessment Questions 1. ONSET: "When did the swelling start?" (e.g., minutes, hours, days)     2 weeks 2. LOCATION: "What part of the leg is swollen?"  "Are both legs swollen or just one leg?"     Both legs (thigh to feet) 3. SEVERITY: "How bad is the swelling?" (e.g., localized; mild, moderate, severe)   - Localized: Small area of swelling localized to one leg.   - MILD pedal edema: Swelling limited to foot and ankle, pitting edema < 1/4 inch (6 mm) deep, rest and elevation eliminate most or all swelling.   - MODERATE edema: Swelling of lower leg to knee, pitting edema > 1/4 inch (6 mm) deep, rest and elevation only partially reduce swelling.   - SEVERE  edema: Swelling extends above knee, facial or hand swelling present.      severe 4. REDNESS: "Does the swelling look red or infected?"     denies 5. PAIN: "Is the swelling painful to touch?" If Yes, ask: "How painful is it?"   (Scale 1-10; mild, moderate or severe)     Yes, 5 6. FEVER: "Do you have a fever?" If Yes, ask: "What is it, how was it measured, and when did it start?"      denies 7. CAUSE: "What do you think is causing the leg swelling?"     unsure 8. MEDICAL HISTORY: "Do you have a history of blood clots (e.g., DVT), cancer, heart failure, kidney disease, or liver failure?"     denies 9. RECURRENT SYMPTOM: "Have you had leg swelling before?" If Yes, ask: "When was the last time?" "What happened that time?"     Retaining fluid swelling 10. OTHER SYMPTOMS: "Do you have any other symptoms?" (e.g., chest pain, difficulty breathing)       Right hand/arm numb/cough/wheezing some  Protocols used: Leg Swelling and Edema-A-AH

## 2023-06-15 NOTE — Progress Notes (Signed)
Acute Office Visit  Subjective:     Patient ID: Pam Chen, female    DOB: December 20, 1970, 53 y.o.   MRN: [id number]   HPI Patient is in today for bilateral lower extremity swelling. She reports she was evaluated by her PCP about 2 weeks ago, with lab work done at that time. She was started on 20mg  furosemide at that time. Today, patient reports legs are "double" their usual size and relates a tight feeling of the skin on her lower extremities. She denies fever, erythema, pain, or warmth to lower extremities. She is wearing compression stockings and doing her best to elevate and decrease salt intake. She does admit to some exertional shortness of breath and mild orthopnea.   She also reports numbness/tingling to her right upper extremity. She denies pain or injury. She states these symptoms started around the time of the lower extremity edema. She describes feeling like pins and needs. She reports future appointments with ortho and a chiropractor for symptoms.   Review of Systems  Constitutional:  Negative for chills, fever and weight loss.  Respiratory:  Positive for cough and shortness of breath. Negative for wheezing.   Cardiovascular:  Positive for orthopnea and leg swelling. Negative for chest pain and palpitations.  Musculoskeletal:  Positive for back pain and neck pain. Negative for joint pain.  Skin:  Negative for itching and rash.  Neurological:  Positive for tingling.        Objective:     BP 107/69   Pulse 83   Temp (!) 97.3 F (36.3 C)   Resp (!) 98   Wt 228 lb (103.4 kg)   BMI 35.71 kg/m   Physical Exam Constitutional:      Appearance: Normal appearance. She is obese.  HENT:     Head: Normocephalic.     Mouth/Throat:     Mouth: Mucous membranes are moist.     Pharynx: Oropharynx is clear.  Eyes:     Extraocular Movements: Extraocular movements intact.     Conjunctiva/sclera: Conjunctivae normal.  Cardiovascular:     Rate and Rhythm: Normal rate and regular  rhythm.     Pulses: Normal pulses.     Heart sounds: No murmur heard.    No gallop.  Pulmonary:     Effort: Pulmonary effort is normal.     Breath sounds: Normal breath sounds. No wheezing, rhonchi or rales.  Musculoskeletal:     Right upper arm: No swelling or edema.     Cervical back: Normal range of motion. No signs of trauma or rigidity. Normal range of motion.     Right lower leg: Tenderness present. 2+ Edema present.     Left lower leg: Tenderness present. 2+ Edema present.  Skin:    General: Skin is warm and dry.  Neurological:     General: No focal deficit present.     Mental Status: She is alert and oriented to person, place, and time.     Sensory: Sensation is intact.     Motor: No weakness.     Gait: Gait is intact.  Psychiatric:        Mood and Affect: Mood normal.        Behavior: Behavior normal.     No results found for any visits on 06/15/23.      Assessment & Plan:  Peripheral edema  Shortness of breath -     DG Chest 2 View  Cervical radicular pain -     Gabapentin; Take  1 capsule (100 mg total) by mouth 3 (three) times daily. First 3 days 1 capsule at bedtime, next 3 days increase to 2 capsules daily, if tolerating can increase to 1 capsule 3 times a day  Dispense: 90 capsule; Refill: 3  Patient appears stable today. Physical exam overall benign. 2+ pitting edema to bilateral lower extremities, up to about knee level. No erythema, warmth, or tortuous veins noted. Increasing furosemide to 40mg  daily. Patient encouraged to continue wearing compression stocking, elevating legs, and decreasing salt intake. Chest XR ordered today to evaluate shortness of breath. Patient started on gabapentin 100mg  today for suspicions of cervical radiculopathy. Patient advised to start with 100mg  at bed time and slowly ramp up to 100mg  TID.  Advised close follow up with PCP for further workup of symptoms. Patient agreeable to plan.   Return if symptoms worsen or fail to  improve.  Toni Amend Josephanthony Tindel, PA-C

## 2023-06-16 ENCOUNTER — Ambulatory Visit (HOSPITAL_COMMUNITY)
Admission: RE | Admit: 2023-06-16 | Discharge: 2023-06-16 | Disposition: A | Discharge status: Home / Self Care | Payer: MEDICAID | Source: Ambulatory Visit | Attending: Physician Assistant

## 2023-06-16 DIAGNOSIS — R0602 Shortness of breath: Secondary | ICD-10-CM | POA: Diagnosis present

## 2023-06-17 ENCOUNTER — Encounter: Payer: Self-pay | Admitting: Physician Assistant

## 2023-06-18 ENCOUNTER — Ambulatory Visit (INDEPENDENT_AMBULATORY_CARE_PROVIDER_SITE_OTHER): Payer: MEDICAID | Admitting: Nurse Practitioner

## 2023-06-18 ENCOUNTER — Ambulatory Visit: Payer: Self-pay | Admitting: Family Medicine

## 2023-06-18 ENCOUNTER — Encounter: Payer: Self-pay | Admitting: Nurse Practitioner

## 2023-06-18 VITALS — BP 113/68 | HR 97 | Temp 97.1°F | Ht 67.0 in | Wt 229.0 lb

## 2023-06-18 DIAGNOSIS — Z8249 Family history of ischemic heart disease and other diseases of the circulatory system: Secondary | ICD-10-CM | POA: Diagnosis not present

## 2023-06-18 DIAGNOSIS — R0602 Shortness of breath: Secondary | ICD-10-CM | POA: Insufficient documentation

## 2023-06-18 DIAGNOSIS — I252 Old myocardial infarction: Secondary | ICD-10-CM | POA: Insufficient documentation

## 2023-06-18 DIAGNOSIS — R600 Localized edema: Secondary | ICD-10-CM | POA: Diagnosis not present

## 2023-06-18 NOTE — Progress Notes (Unsigned)
Acute Office Visit  Subjective:     Patient ID: Pam Chen, female    DOB: 1970/12/01, 53 y.o.   MRN: [id number]  Chief Complaint  Patient presents with   Leg Swelling    Says swelling is all over body for 2.5 weeks and says it is painful    HPI Pam Chen is 53 yrs old female present 06/18/2023 for an acute visit concerns for BLE. She has an extensive family history of cardiac issues, both parents has hx of CHF. She was seen at [facility] medicine 06/15/2023, had chest x-ray and d/c on lasix 20. Weight 05/31/23 223 lbs, 06/15/23 228lbs and 06/18/23 229 lbs. Family hx of CHF both parents EKG from today's visit  when compare to x-ray from 2021 show old infarct, no hx of CHF Active Ambulatory Problems    Diagnosis Date Noted   Obesity (BMI 30.0-34.9) 02/02/2017   Insomnia disorder 02/02/2017   GERD (gastroesophageal reflux disease) 02/02/2017   Migraine 02/02/2017   Bipolar 1 disorder, depressed (HCC) 01/09/2016   Current every day smoker 03/07/2017   Hyperlipidemia LDL goal <130 05/01/2017   Herpes simplex type 1 infection 02/13/2019   Herpes simplex type 2 infection 02/13/2019   History of adenomatous polyp of colon 05/02/2016   History of partial colectomy 03/28/2016   Insomnia 08/18/2019   Obstructive sleep apnea syndrome 05/24/2016   Polysubstance dependence including opioid type drug with complication, episodic abuse (HCC) 08/25/2019   Hypothyroidism 07/23/2020   Constipation 08/20/2020   Chronic migraine with aura 08/31/2020   OAB (overactive bladder) 01/18/2023   Peripheral edema 06/18/2023   Old myocardial infarct 06/18/2023   SOB (shortness of breath) on exertion 06/18/2023   Family history of CHF (congestive heart failure) 06/18/2023   Resolved Ambulatory Problems    Diagnosis Date Noted   Depression, major, single episode, in partial remission (HCC) 02/02/2017   Depression, major, recurrent, in partial remission (HCC) 02/02/2017   History of bipolar  disorder 08/25/2019   Cough 01/18/2021   Sinus congestion 01/18/2021   Past Medical History:  Diagnosis Date   Anxiety    Arthritis    Bipolar disorder (HCC)    Depression    Diverticulitis    Hypertension    Hypothyroid 02/02/2017   Kidney stones    Rotator cuff injury    Sleep apnea    Tennis elbow    Thyroid disease     Review of Systems  Constitutional:  Negative for chills and fever.  HENT:  Negative for hearing loss and sore throat.   Eyes:  Negative for pain.  Respiratory:  Positive for shortness of breath. Negative for cough, wheezing and stridor.   Cardiovascular:  Positive for leg swelling.       Bilateral +3  Gastrointestinal:  Negative for constipation, diarrhea, nausea and vomiting.  Genitourinary:  Negative for frequency and urgency.  Musculoskeletal:  Negative for falls.  Skin:  Negative for itching and rash.  Neurological:  Negative for dizziness and headaches.  Endo/Heme/Allergies:  Negative for environmental allergies and polydipsia. Does not bruise/bleed easily.  Psychiatric/Behavioral:  Negative for suicidal ideas.    Negative unless indicated in HPI    Objective:    BP 113/68   Pulse 97   Temp (!) 97.1 F (36.2 C) (Temporal)   Ht 5\' 7"  (1.702 m)   Wt 229 lb (103.9 kg)   SpO2 100%   BMI 35.87 kg/m  BP Readings from Last 3 Encounters:  06/18/23 113/68  06/15/23 107/69  06/01/23 121/73   Wt Readings from Last 3 Encounters:  06/18/23 229 lb (103.9 kg)  06/15/23 228 lb (103.4 kg)  06/01/23 223 lb (101.2 kg)      Physical Exam HENT:     Head: Normocephalic and atraumatic.     Mouth/Throat:     Mouth: Mucous membranes are moist.  Eyes:     General: No scleral icterus.    Extraocular Movements: Extraocular movements intact.     Conjunctiva/sclera: Conjunctivae normal.     Pupils: Pupils are equal, round, and reactive to light.  Neck:     Vascular: No carotid bruit.  Cardiovascular:     Rate and Rhythm: Normal rate and regular  rhythm.     Pulses: Normal pulses.     Heart sounds: Normal heart sounds and S1 normal.  Pulmonary:     Effort: Pulmonary effort is normal.     Breath sounds: Normal breath sounds.  Musculoskeletal:     Cervical back: Normal range of motion. No rigidity or tenderness.     Right lower leg: 2+ Pitting Edema present.     Left lower leg: 2+ Pitting Edema present.  Lymphadenopathy:     Cervical: No cervical adenopathy.  Neurological:     Mental Status: She is alert and oriented to person, place, and time. Mental status is at baseline.  Psychiatric:        Mood and Affect: Mood normal.        Behavior: Behavior normal.        Thought Content: Thought content normal.        Judgment: Judgment normal.     No results found for any visits on 06/18/23.      Assessment & Plan:  Peripheral edema -     EKG 12-Lead -     ECHOCARDIOGRAM COMPLETE; Future -     Ambulatory referral to Cardiology  Old myocardial infarct -     ECHOCARDIOGRAM COMPLETE; Future -     Ambulatory referral to Cardiology  SOB (shortness of breath) on exertion -     ECHOCARDIOGRAM COMPLETE; Future -     Ambulatory referral to Cardiology  Family history of CHF (congestive heart failure) -     Ambulatory referral to Cardiology   Canna is 53 yrs Caucasian female seen today for BLE SOB on exertion, no acute distress EKG show old infarct when compare to last EKG from 2021 Continue lasix 20 mg daily Ulna boots applied bilateral, client will come back 1-week for removal echo ordered, will refer to cardiology Return for PCP in 1-week.  Arrie Aran Santa Lighter, [place] [facility] [address] [place], [place] [address] [phone]  Note: This document was prepared by [employer] voice dictation technology and any errors that results from this process are unintentional.

## 2023-06-18 NOTE — Telephone Encounter (Signed)
  Chief Complaint: BLE edema Symptoms: swelling Frequency: 2 weeks Pertinent Negatives: Patient denies CP Disposition: [] ED /[] Urgent Care (no appt availability in office) / [x] Appointment(In office/virtual)/ []  [facility]/ [] Home Care/ [] Refused Recommended Disposition /[] [place] [facility]/ []  Follow-up with PCP Additional Notes: Patient called reporting BLE edema from feet to thighs, extremely tight skin that is painful to touch x 2 weeks. States she was evaluated last week and lasix was increased and imaging was performed but has not had any improvement. Reports she feels SOB at times with activity. Denies CP at this time. Per protocol, patient to be evaluated within 4 hours. Scheduled with first available provider for today at 1400.Care advice reviewed with patient, understanding verbalized. Denies further questions at this time. Alerting PCP for review.    Copied from CRM [id number]. Topic: Clinical - Red Word Triage >> Jun 18, 2023 10:24 AM Shelah Lewandowsky wrote: Red Word that prompted transfer to Nurse Triage: Swelling of legs, feet and ankles, along with right arm and hands. Shortness of breath, aching feeling in chest. Reason for Disposition  SEVERE leg swelling (e.g., swelling extends above knee, entire leg is swollen, weeping fluid)  Answer Assessment - Initial Assessment Questions 1. ONSET: "When did the swelling start?" (e.g., minutes, hours, days)     2 weeks 2. LOCATION: "What part of the leg is swollen?"  "Are both legs swollen or just one leg?"     R is worse than L, thighs down on both legs 3. SEVERITY: "How bad is the swelling?" (e.g., localized; mild, moderate, severe)   - Localized: Small area of swelling localized to one leg.   - MILD pedal edema: Swelling limited to foot and ankle, pitting edema < 1/4 inch (6 mm) deep, rest and elevation eliminate most or all swelling.   - MODERATE edema: Swelling of lower leg to knee, pitting edema > 1/4 inch (6 mm) deep,  rest and elevation only partially reduce swelling.   - SEVERE edema: Swelling extends above knee, facial or hand swelling present.      Severe 4. REDNESS: "Does the swelling look red or infected?"     Denies- reports extremely tight skin 5. PAIN: "Is the swelling painful to touch?" If Yes, ask: "How painful is it?"   (Scale 1-10; mild, moderate or severe)     5/10 6. FEVER: "Do you have a fever?" If Yes, ask: "What is it, how was it measured, and when did it start?"      Denies 7. CAUSE: "What do you think is causing the leg swelling?"     Unsure, maybe low hemoglobin 8. MEDICAL HISTORY: "Do you have a history of blood clots (e.g., DVT), cancer, heart failure, kidney disease, or liver failure?"     Denies 9. RECURRENT SYMPTOM: "Have you had leg swelling before?" If Yes, ask: "When was the last time?" "What happened that time?"     Yes, several years, corrected with lasix 10. OTHER SYMPTOMS: "Do you have any other symptoms?" (e.g., chest pain, difficulty breathing)       Weight gain, mild SOB- states mostly with activity. Both hands have mild swelling also.  Protocols used: Leg Swelling and Edema-A-AH

## 2023-06-20 ENCOUNTER — Telehealth: Payer: Self-pay | Admitting: Family Medicine

## 2023-06-20 NOTE — Telephone Encounter (Signed)
Returned called and addressed question  Copied from CRM [id number]. Topic: Appointments - Appointment Info/Confirmation >> Jun 20, 2023 12:34 PM Pam Chen wrote: Patient/patient representative is calling for information regarding an appointment.

## 2023-06-21 ENCOUNTER — Telehealth: Payer: Self-pay | Admitting: Family Medicine

## 2023-06-21 NOTE — Telephone Encounter (Signed)
It looks like we did send in an order for an echocardiogram, that would be what would tell us if there is any vegetations or involvement so we will await that result.

## 2023-06-21 NOTE — Telephone Encounter (Signed)
Spoke with Pam Chen from Dr [employer] office stating that she was faxing [name] a Release of Information form. Says she spoke with patient who mentioned CHF and Dr Doylene Canning wants to know if PCP can rule out Valvular Involvement IVDA.  Please advise.   Will be on the lookout for release form.

## 2023-06-27 ENCOUNTER — Ambulatory Visit (INDEPENDENT_AMBULATORY_CARE_PROVIDER_SITE_OTHER): Payer: MEDICAID | Admitting: Family Medicine

## 2023-06-27 ENCOUNTER — Encounter: Payer: Self-pay | Admitting: Family Medicine

## 2023-06-27 VITALS — BP 109/67 | HR 65 | Ht 67.0 in | Wt 215.0 lb

## 2023-06-27 DIAGNOSIS — R600 Localized edema: Secondary | ICD-10-CM | POA: Diagnosis not present

## 2023-06-27 NOTE — Progress Notes (Unsigned)
BP 109/67   Pulse 65   Ht 5\' 7"  (1.702 m)   Wt 97.5 kg   SpO2 99%   BMI 33.67 kg/m    Subjective:   Patient ID: Pam Chen, female    DOB: 07/16/1970, 53 y.o.   MRN: [id number]  HPI: Pam Chen is a 53 y.o. female presenting on 06/27/2023 for Peripheral edema   HPI Returns for Peripheral edema check with Unna boot placement from 7 days ago. Unna boots were tolerated and removed at visit. Patient feels swelling is a lot less than before. Has soreness but denies pain. Denies numbness or paresthesia. Was taking Lasix twice daily and stopped taking 2 days ago. Has been elevating legs when resting and ambulating well. Reports chest "ache" that she describes as not painful. Reports some shortness of breath that when she uses her Albuterol inhaler improves. Has experienced 2-3 headaches in the last week. She takes her Ibuprofen 800mg  with improvement. She has a history of for chronic migraines but says these are not as severe. She has a family history for cardiac disease and has a ECHO scheduled for 07/06/2023 and a Cardiologist appointment in March.  Relevant past medical, surgical, family and social history reviewed and updated as indicated. Interim medical history since our last visit reviewed. Allergies and medications reviewed and updated.  Review of Systems  Constitutional:  Negative for chills and fever.  Respiratory:  Positive for shortness of breath. Negative for chest tightness.   Cardiovascular:  Positive for leg swelling (Bilateral).  Musculoskeletal:  Negative for gait problem.  Skin:  Negative for rash and wound.  Neurological:  Positive for headaches. Negative for weakness.  Psychiatric/Behavioral:  Negative for agitation.   All other systems reviewed and are negative.   Per HPI unless specifically indicated above   Allergies as of 06/27/2023       Reactions   Aspirin Other (See Comments)   Upset tummy.  Can take ibuprofen.   Oxycodone-acetaminophen Anxiety   Can  take vicodin   Tramadol Hcl Nausea Only, Other (See Comments)   And headache And headache        Medication List        Accurate as of June 27, 2023  3:58 PM. If you have any questions, ask your nurse or doctor.          albuterol 108 (90 Base) MCG/ACT inhaler Commonly known as: VENTOLIN HFA TAKE 1-2 PUFFS AS NEEDED FOR WHEEZING/CHEST TIGHTNESS   ARIPiprazole 30 MG tablet Commonly known as: ABILIFY Take 1 tablet (30 mg total) by mouth daily.   FLUoxetine 20 MG capsule Commonly known as: PROZAC Take 3 capsules (60 mg total) by mouth daily.   fluticasone-salmeterol 250-50 MCG/ACT Aepb Commonly known as: Advair Diskus Inhale 1 puff into the lungs in the morning and at bedtime.   furosemide 20 MG tablet Commonly known as: LASIX Take 1 tablet (20 mg total) by mouth daily.   gabapentin 100 MG capsule Commonly known as: NEURONTIN Take 1 capsule (100 mg total) by mouth 3 (three) times daily. First 3 days 1 capsule at bedtime, next 3 days increase to 2 capsules daily, if tolerating can increase to 1 capsule 3 times a day   ibuprofen 800 MG tablet Commonly known as: ADVIL Take 1 tablet (800 mg total) by mouth 3 (three) times daily as needed.   mirabegron ER 50 MG Tb24 tablet Commonly known as: Myrbetriq Take 1 tablet (50 mg total) by mouth daily.   omeprazole 20  MG capsule Commonly known as: PRILOSEC Take 1 capsule (20 mg total) by mouth daily.   ondansetron 4 MG disintegrating tablet Commonly known as: ZOFRAN-ODT Take 1 tablet (4 mg total) by mouth every 8 (eight) hours as needed.   rosuvastatin 10 MG tablet Commonly known as: Crestor Take 1 tablet (10 mg total) by mouth daily.   Suboxone 8-2 MG Film Generic drug: Buprenorphine HCl-Naloxone HCl Place 2 Film under the tongue in the morning and at bedtime.         Objective:   BP 109/67   Pulse 65   Ht 5\' 7"  (1.702 m)   Wt 97.5 kg   SpO2 99%   BMI 33.67 kg/m   Wt Readings from Last 3 Encounters:   06/27/23 97.5 kg  06/18/23 103.9 kg  06/15/23 103.4 kg    Physical Exam Vitals reviewed.  Constitutional:      General: She is not in acute distress.    Appearance: Normal appearance. She is not diaphoretic.  Cardiovascular:     Rate and Rhythm: Normal rate and regular rhythm.     Heart sounds: Normal heart sounds. No murmur heard. Pulmonary:     Effort: Pulmonary effort is normal. No respiratory distress.     Breath sounds: Normal breath sounds. No wheezing or rales.  Musculoskeletal:     Right lower leg: Tenderness present. No lacerations. 1+ Pitting Edema present.     Left lower leg: Tenderness present. No lacerations. 1+ Pitting Edema present.     Right foot: Swelling present.     Left foot: Swelling present.  Skin:    General: Skin is warm and dry.     Capillary Refill: Capillary refill takes less than 2 seconds.  Neurological:     Mental Status: She is alert.  Psychiatric:        Mood and Affect: Mood normal.        Behavior: Behavior normal.       Assessment & Plan:   Problem List Items Addressed This Visit       Other   Peripheral edema - Primary   Relevant Orders   BMP8+EGFR    Bilateral peripheral leg edema appears to be getting better. Will wrap both legs again with ACE wraps and have her follow in 1 week for peripheral edema re-check. Discussed with her about continuing to take the Lasix as prescribed, elevating feet when at rest and ambulating. Will check BMP labs. She has appointment to see Cardiologist which is beneficial considering symptoms she is experiencing and family history. Follow up plan: Return in about 1 week (around 07/04/2023), or if symptoms worsen or fail to improve, for Peripheral edema recheck.  Counseling provided for all of the vaccine components Orders Placed This Encounter  Procedures   BMP8+EGFR   Maximino Sarin PA-S [facility] 06/27/2023, 3:58 PM

## 2023-06-28 ENCOUNTER — Encounter: Payer: Self-pay | Admitting: Family Medicine

## 2023-06-28 LAB — BMP8+EGFR
BUN/Creatinine Ratio: 13 (ref 9–23)
BUN: 11 mg/dL (ref 6–24)
CO2: 25 mmol/L (ref 20–29)
Calcium: 9.8 mg/dL (ref 8.7–10.2)
Chloride: 102 mmol/L (ref 96–106)
Creatinine, Ser: 0.88 mg/dL (ref 0.57–1.00)
Glucose: 87 mg/dL (ref 70–99)
Potassium: 4.7 mmol/L (ref 3.5–5.2)
Sodium: 141 mmol/L (ref 134–144)
eGFR: 80 mL/min/{1.73_m2} (ref >59)

## 2023-07-04 ENCOUNTER — Other Ambulatory Visit: Payer: Self-pay

## 2023-07-04 ENCOUNTER — Ambulatory Visit (INDEPENDENT_AMBULATORY_CARE_PROVIDER_SITE_OTHER): Payer: MEDICAID | Admitting: Family Medicine

## 2023-07-04 ENCOUNTER — Encounter: Payer: Self-pay | Admitting: Family Medicine

## 2023-07-04 VITALS — BP 115/74 | HR 76 | Temp 97.2°F | Wt 217.4 lb

## 2023-07-04 DIAGNOSIS — Z8249 Family history of ischemic heart disease and other diseases of the circulatory system: Secondary | ICD-10-CM

## 2023-07-04 DIAGNOSIS — R600 Localized edema: Secondary | ICD-10-CM

## 2023-07-04 NOTE — Progress Notes (Signed)
 BP 115/74   Pulse 76   Temp (!) 97.2 F (36.2 C)   Wt 217 lb 6.4 oz (98.6 kg)   SpO2 98%   BMI 34.05 kg/m    Subjective:   Patient ID: Pam Chen, female    DOB: [birth year], 53 y.o.   MRN: [id number]  HPI: Pam Chen is a 53 y.o. female presenting on 07/04/2023 for [employer] and Peripheral Edema   HPI Peripheral edema Patient is coming in today for continued peripheral edema.  She continues to wrap and keep it elevated in the evenings but she continues to have the swelling.  She denies any shortness of breath or wheezing but her BNP was high and with her history of IV drug abuse there is concerned that she may have a vegetation or something going on.  Relevant past medical, surgical, family and social history reviewed and updated as indicated. Interim medical history since our last visit reviewed. Allergies and medications reviewed and updated.  Review of Systems  Constitutional:  Negative for chills and fever.  Eyes:  Negative for visual disturbance.  Respiratory:  Negative for chest tightness and shortness of breath.   Cardiovascular:  Positive for leg swelling. Negative for chest pain and palpitations.  Genitourinary:  Negative for difficulty urinating and dysuria.  Musculoskeletal:  Negative for back pain and gait problem.  Skin:  Negative for rash.  Neurological:  Negative for dizziness, light-headedness and headaches.  Psychiatric/Behavioral:  Negative for agitation and behavioral problems.   All other systems reviewed and are negative.   Per HPI unless specifically indicated above   Allergies as of 07/04/2023       Reactions   Aspirin Other (See Comments)   Upset tummy.  Can take ibuprofen .   Oxycodone-acetaminophen  Anxiety   Can take vicodin   Tramadol Hcl Nausea Only, Other (See Comments)   And headache And headache        Medication List        Accurate as of July 04, 2023  4:34 PM. If you have any questions, ask your nurse or doctor.           albuterol  108 (90 Base) MCG/ACT inhaler Commonly known as: VENTOLIN  HFA TAKE 1-2 PUFFS AS NEEDED FOR WHEEZING/CHEST TIGHTNESS   ARIPiprazole  30 MG tablet Commonly known as: ABILIFY  Take 1 tablet (30 mg total) by mouth daily.   FLUoxetine  20 MG capsule Commonly known as: PROZAC  Take 3 capsules (60 mg total) by mouth daily.   fluticasone -salmeterol 250-50 MCG/ACT Aepb Commonly known as: Advair Diskus Inhale 1 puff into the lungs in the morning and at bedtime.   furosemide  20 MG tablet Commonly known as: LASIX  Take 1 tablet (20 mg total) by mouth daily.   gabapentin  100 MG capsule Commonly known as: NEURONTIN  Take 1 capsule (100 mg total) by mouth 3 (three) times daily. First 3 days 1 capsule at bedtime, next 3 days increase to 2 capsules daily, if tolerating can increase to 1 capsule 3 times a day   ibuprofen  800 MG tablet Commonly known as: ADVIL  Take 1 tablet (800 mg total) by mouth 3 (three) times daily as needed.   mirabegron  ER 50 MG Tb24 tablet Commonly known as: Myrbetriq  Take 1 tablet (50 mg total) by mouth daily.   omeprazole  20 MG capsule Commonly known as: PRILOSEC Take 1 capsule (20 mg total) by mouth daily.   ondansetron  4 MG disintegrating tablet Commonly known as: ZOFRAN -ODT Take 1 tablet (4 mg total) by mouth every  8 (eight) hours as needed.   rosuvastatin  10 MG tablet Commonly known as: Crestor  Take 1 tablet (10 mg total) by mouth daily.   Suboxone 8-2 MG Film Generic drug: Buprenorphine HCl-Naloxone HCl Place 2 Film under the tongue in the morning and at bedtime.         Objective:   BP 115/74   Pulse 76   Temp (!) 97.2 F (36.2 C)   Wt 217 lb 6.4 oz (98.6 kg)   SpO2 98%   BMI 34.05 kg/m   Wt Readings from Last 3 Encounters:  07/04/23 217 lb 6.4 oz (98.6 kg)  06/27/23 215 lb (97.5 kg)  06/18/23 229 lb (103.9 kg)    Physical Exam Vitals and nursing note reviewed.  Constitutional:      General: She is not in acute distress.     Appearance: She is well-developed. She is not diaphoretic.  Eyes:     Conjunctiva/sclera: Conjunctivae normal.  Musculoskeletal:        General: Swelling (Patient has 2+ pitting edema bilateral lower extremities) present. No tenderness. Normal range of motion.  Skin:    General: Skin is warm and dry.     Findings: No rash.  Neurological:     Mental Status: She is alert and oriented to person, place, and time.     Coordination: Coordination normal.  Psychiatric:        Behavior: Behavior normal.       Assessment & Plan:   Problem List Items Addressed This Visit       Other   Peripheral edema - Primary   Family history of CHF (congestive heart failure)    She is not currently short of breath but still having significant edema and concerned that she is still having some kind of issue with her heart.  We have referral in for cardiology which she is seen in March and she had an echocardiogram Friday.  She is asking about a TEE and we are going to look into the possibility of ordering it in the future or getting cardiology to order it.  Continue to rewrap legs during the daytime and leave the wraps off at night. Follow up plan: Return if symptoms worsen or fail to improve.  Counseling provided for all of the vaccine components No orders of the defined types were placed in this encounter.   Fonda Levins, MD [facility] 07/04/2023, 4:34 PM

## 2023-07-06 ENCOUNTER — Ambulatory Visit (HOSPITAL_COMMUNITY): Admission: RE | Admit: 2023-07-06 | Payer: MEDICAID | Source: Ambulatory Visit

## 2023-07-11 ENCOUNTER — Ambulatory Visit (HOSPITAL_COMMUNITY)
Admission: RE | Admit: 2023-07-11 | Discharge: 2023-07-11 | Disposition: A | Discharge status: Home / Self Care | Payer: MEDICAID | Source: Ambulatory Visit | Attending: Nurse Practitioner | Admitting: Nurse Practitioner

## 2023-07-11 DIAGNOSIS — I252 Old myocardial infarction: Secondary | ICD-10-CM | POA: Insufficient documentation

## 2023-07-11 DIAGNOSIS — R0602 Shortness of breath: Secondary | ICD-10-CM | POA: Insufficient documentation

## 2023-07-11 DIAGNOSIS — R600 Localized edema: Secondary | ICD-10-CM | POA: Insufficient documentation

## 2023-07-11 DIAGNOSIS — R9431 Abnormal electrocardiogram [ECG] [EKG]: Secondary | ICD-10-CM

## 2023-07-11 LAB — ECHOCARDIOGRAM COMPLETE
Area-P 1/2: 2.66 cm2
S' Lateral: 3.2 cm

## 2023-07-11 NOTE — Progress Notes (Signed)
*  PRELIMINARY RESULTS* Echocardiogram 2D Echocardiogram has been performed.  Pam Chen 07/11/2023, 11:16 AM

## 2023-07-12 ENCOUNTER — Other Ambulatory Visit: Payer: Self-pay | Admitting: Family Medicine

## 2023-07-18 ENCOUNTER — Encounter: Payer: Self-pay | Admitting: Family Medicine

## 2023-07-18 ENCOUNTER — Ambulatory Visit (INDEPENDENT_AMBULATORY_CARE_PROVIDER_SITE_OTHER): Payer: MEDICAID | Admitting: Family Medicine

## 2023-07-18 VITALS — BP 125/83 | HR 84 | Ht 67.0 in | Wt 207.0 lb

## 2023-07-18 DIAGNOSIS — F319 Bipolar disorder, unspecified: Secondary | ICD-10-CM

## 2023-07-18 DIAGNOSIS — E039 Hypothyroidism, unspecified: Secondary | ICD-10-CM | POA: Diagnosis not present

## 2023-07-18 DIAGNOSIS — R600 Localized edema: Secondary | ICD-10-CM

## 2023-07-18 DIAGNOSIS — F1920 Other psychoactive substance dependence, uncomplicated: Secondary | ICD-10-CM

## 2023-07-18 DIAGNOSIS — E785 Hyperlipidemia, unspecified: Secondary | ICD-10-CM

## 2023-07-18 LAB — LIPID PANEL

## 2023-07-18 NOTE — Progress Notes (Signed)
BP 125/83   Pulse 84   Ht 5\' 7"  (1.702 m)   Wt 207 lb (93.9 kg)   SpO2 99%   BMI 32.42 kg/m    Subjective:   Patient ID: Pam Chen, female    DOB: 1970/09/29, 53 y.o.   MRN: [id number]  HPI: Pam Chen is a 53 y.o. female presenting on 07/18/2023 for Peripheral edema   HPI Hypothyroidism recheck Patient is coming in for thyroid recheck today as well. They deny any issues with hair changes or heat or cold problems or diarrhea or constipation. They deny any chest pain or palpitations. They are currently on medication currently, has been subclinical  Hyperlipidemia Patient is coming in for recheck of his hyperlipidemia. The patient is currently taking Crestor. They deny any issues with myalgias or history of liver damage from it. They deny any focal numbness or weakness or chest pain.   Bipolar recheck Patient is coming in today for bipolar recheck and currently takes Prozac and Abilify.  Patient feels like she is doing okay.  She still works with pain management and psychiatry with this.    07/18/2023    9:02 AM 06/27/2023    3:03 PM 06/01/2023    9:35 AM 01/18/2023    9:22 AM 01/12/2023    8:54 AM  Depression screen PHQ 2/9  Decreased Interest 1 1 1  0 0  Down, Depressed, Hopeless 1 1 1  0 0  PHQ - 2 Score 2 2 2  0 0  Altered sleeping  2 2 0 1  Tired, decreased energy  1 2 0 0  Change in appetite  1 2 0 0  Feeling bad or failure about yourself   1 3 0 0  Trouble concentrating  1 2 0 0  Moving slowly or fidgety/restless  0 1 0 0  Suicidal thoughts  0 0 0 0  PHQ-9 Score  8 14 0 1  Difficult doing work/chores  Somewhat difficult Somewhat difficult Not difficult at all Not difficult at all    Peripheral edema recheck Patient is coming in today.  Peripheral edema recheck.  Feels like swelling is doing better.  With her history of IV drug abuse we are going to try to do a transesophageal echocardiogram  Relevant past medical, surgical, family and social history reviewed and  updated as indicated. Interim medical history since our last visit reviewed. Allergies and medications reviewed and updated.  Review of Systems  Constitutional:  Negative for chills and fever.  HENT:  Negative for congestion, ear discharge and ear pain.   Eyes:  Negative for redness and visual disturbance.  Respiratory:  Negative for chest tightness and shortness of breath.   Cardiovascular:  Positive for leg swelling. Negative for chest pain.  Genitourinary:  Negative for difficulty urinating and dysuria.  Musculoskeletal:  Negative for back pain and gait problem.  Skin:  Negative for rash.  Neurological:  Negative for dizziness, light-headedness and headaches.  Psychiatric/Behavioral:  Negative for agitation and behavioral problems.   All other systems reviewed and are negative.   Per HPI unless specifically indicated above   Allergies as of 07/18/2023       Reactions   Aspirin Other (See Comments)   Upset tummy.  Can take ibuprofen.   Oxycodone-acetaminophen Anxiety   Can take vicodin   Tramadol Hcl Nausea Only, Other (See Comments)   And headache And headache        Medication List        Accurate as  of July 18, 2023  9:27 AM. If you have any questions, ask your nurse or doctor.          albuterol 108 (90 Base) MCG/ACT inhaler Commonly known as: VENTOLIN HFA TAKE 1-2 PUFFS AS NEEDED FOR WHEEZING/CHEST TIGHTNESS   ARIPiprazole 30 MG tablet Commonly known as: ABILIFY Take 1 tablet (30 mg total) by mouth daily.   FLUoxetine 20 MG capsule Commonly known as: PROZAC Take 3 capsules (60 mg total) by mouth daily.   fluticasone-salmeterol 250-50 MCG/ACT Aepb Commonly known as: Advair Diskus Inhale 1 puff into the lungs in the morning and at bedtime.   furosemide 20 MG tablet Commonly known as: LASIX Take 1 tablet (20 mg total) by mouth daily.   gabapentin 100 MG capsule Commonly known as: NEURONTIN Take 1 capsule (100 mg total) by mouth 3 (three) times  daily. First 3 days 1 capsule at bedtime, next 3 days increase to 2 capsules daily, if tolerating can increase to 1 capsule 3 times a day   ibuprofen 800 MG tablet Commonly known as: ADVIL TAKE 1 TABLET BY MOUTH 3 TIMES DAILY AS NEEDED.   mirabegron ER 50 MG Tb24 tablet Commonly known as: Myrbetriq Take 1 tablet (50 mg total) by mouth daily.   omeprazole 20 MG capsule Commonly known as: PRILOSEC Take 1 capsule (20 mg total) by mouth daily.   ondansetron 4 MG disintegrating tablet Commonly known as: ZOFRAN-ODT Take 1 tablet (4 mg total) by mouth every 8 (eight) hours as needed.   rosuvastatin 10 MG tablet Commonly known as: Crestor Take 1 tablet (10 mg total) by mouth daily.   Suboxone 8-2 MG Film Generic drug: Buprenorphine HCl-Naloxone HCl Place 2 Film under the tongue in the morning and at bedtime.         Objective:   BP 125/83   Pulse 84   Ht 5\' 7"  (1.702 m)   Wt 207 lb (93.9 kg)   SpO2 99%   BMI 32.42 kg/m   Wt Readings from Last 3 Encounters:  07/18/23 207 lb (93.9 kg)  07/04/23 217 lb 6.4 oz (98.6 kg)  06/27/23 215 lb (97.5 kg)    Physical Exam Vitals and nursing note reviewed.  Constitutional:      General: She is not in acute distress.    Appearance: She is well-developed. She is not diaphoretic.  Eyes:     Conjunctiva/sclera: Conjunctivae normal.  Cardiovascular:     Rate and Rhythm: Normal rate and regular rhythm.     Heart sounds: Normal heart sounds. No murmur heard. Pulmonary:     Effort: Pulmonary effort is normal. No respiratory distress.     Breath sounds: Normal breath sounds. No wheezing.  Skin:    General: Skin is warm and dry.     Findings: No rash.  Neurological:     Mental Status: She is alert and oriented to person, place, and time.     Coordination: Coordination normal.  Psychiatric:        Behavior: Behavior normal.       Assessment & Plan:   Problem List Items Addressed This Visit       Endocrine   Hypothyroidism    Relevant Orders   TSH     Other   Hyperlipidemia LDL goal <130 - Primary   Relevant Orders   CBC with Differential/Platelet   CMP14+EGFR   Lipid panel   Bipolar 1 disorder, depressed (HCC)   Relevant Orders   CBC with Differential/Platelet  Polysubstance dependence including opioid type drug with complication, episodic abuse (HCC)   Relevant Orders   ECHO TEE   Peripheral edema   Relevant Orders   ECHO TEE    Continue current medicine, will check blood work today, will also refer for TEE, echocardiogram was normal but she still having peripheral edema with history of IV drug abuse Follow up plan: Return in about 3 months (around 10/15/2023), or if symptoms worsen or fail to improve, for Thyroid and cholesterol recheck.  Counseling provided for all of the vaccine components Orders Placed This Encounter  Procedures   CBC with Differential/Platelet   CMP14+EGFR   Lipid panel   TSH   ECHO TEE    Arville Care, MD [facility] 07/18/2023, 9:27 AM

## 2023-07-19 LAB — CBC WITH DIFFERENTIAL/PLATELET
Basophils Absolute: 0 10*3/uL (ref 0.0–0.2)
Basos: 0 %
EOS (ABSOLUTE): 0.1 10*3/uL (ref 0.0–0.4)
Eos: 2 %
Hematocrit: 35.9 % (ref 34.0–46.6)
Hemoglobin: 11.3 g/dL (ref 11.1–15.9)
Immature Grans (Abs): 0 10*3/uL (ref 0.0–0.1)
Immature Granulocytes: 0 %
Lymphocytes Absolute: 1.8 10*3/uL (ref 0.7–3.1)
Lymphs: 36 %
MCH: 26 pg — ABNORMAL LOW (ref 26.6–33.0)
MCHC: 31.5 g/dL (ref 31.5–35.7)
MCV: 83 fL (ref 79–97)
Monocytes Absolute: 0.3 10*3/uL (ref 0.1–0.9)
Monocytes: 7 %
Neutrophils Absolute: 2.7 10*3/uL (ref 1.4–7.0)
Neutrophils: 55 %
Platelets: 301 10*3/uL (ref 150–450)
RBC: 4.34 x10E6/uL (ref 3.77–5.28)
RDW: 12.9 % (ref 11.7–15.4)
WBC: 5 10*3/uL (ref 3.4–10.8)

## 2023-07-19 LAB — CMP14+EGFR
ALT: 10 IU/L (ref 0–32)
AST: 13 IU/L (ref 0–40)
Albumin: 3.6 g/dL — ABNORMAL LOW (ref 3.9–4.9)
Alkaline Phosphatase: 81 IU/L (ref 44–121)
BUN/Creatinine Ratio: 14 (ref 9–23)
BUN: 13 mg/dL (ref 6–24)
Bilirubin Total: 0.2 mg/dL (ref 0.0–1.2)
CO2: 25 mmol/L (ref 20–29)
Calcium: 9.4 mg/dL (ref 8.7–10.2)
Chloride: 106 mmol/L (ref 96–106)
Creatinine, Ser: 0.92 mg/dL (ref 0.57–1.00)
Globulin, Total: 2.4 g/dL (ref 1.5–4.5)
Glucose: 92 mg/dL (ref 70–99)
Potassium: 4.1 mmol/L (ref 3.5–5.2)
Sodium: 142 mmol/L (ref 134–144)
Total Protein: 6 g/dL (ref 6.0–8.5)
eGFR: 76 mL/min/{1.73_m2} (ref >59)

## 2023-07-19 LAB — LIPID PANEL
Cholesterol, Total: 145 mg/dL (ref 100–199)
HDL: 46 mg/dL (ref >39)
LDL CALC COMMENT:: 3.2 ratio (ref 0.0–4.4)
LDL Chol Calc (NIH): 79 mg/dL (ref 0–99)
Triglycerides: 109 mg/dL (ref 0–149)
VLDL Cholesterol Cal: 20 mg/dL (ref 5–40)

## 2023-07-19 LAB — TSH: TSH: 2.61 u[IU]/mL (ref 0.450–4.500)

## 2023-07-20 ENCOUNTER — Ambulatory Visit: Payer: MEDICAID | Admitting: Family Medicine

## 2023-07-27 ENCOUNTER — Encounter: Payer: Self-pay | Admitting: Family Medicine

## 2023-08-09 ENCOUNTER — Telehealth: Payer: Self-pay

## 2023-08-09 NOTE — Telephone Encounter (Signed)
 Copied from CRM [id number]. Topic: Clinical - Medication Question >> Aug 09, 2023  2:16 PM Victorino Dike T wrote: Reason for CRM: gabapentin (NEURONTIN) 100 MG capsule  patient asking if dosage can be raised- [phone]

## 2023-08-09 NOTE — Telephone Encounter (Signed)
 Appt made for 3/19 at 3:55pm

## 2023-08-09 NOTE — Telephone Encounter (Signed)
 Yes is a possibility but would need to discuss in a visit, please schedule an appointment for her or we can discuss it at her next visit.

## 2023-08-15 ENCOUNTER — Encounter: Payer: Self-pay | Admitting: Family Medicine

## 2023-08-15 ENCOUNTER — Ambulatory Visit (INDEPENDENT_AMBULATORY_CARE_PROVIDER_SITE_OTHER): Payer: MEDICAID | Admitting: Family Medicine

## 2023-08-15 VITALS — BP 138/84 | HR 96 | Ht 67.0 in | Wt 194.0 lb

## 2023-08-15 DIAGNOSIS — M5412 Radiculopathy, cervical region: Secondary | ICD-10-CM | POA: Diagnosis not present

## 2023-08-15 MED ORDER — GABAPENTIN 100 MG PO CAPS: 200.0000 mg | ORAL_CAPSULE | Freq: Three times a day (TID) | ORAL | 5 refills | Status: DC

## 2023-08-15 NOTE — Progress Notes (Signed)
 BP 138/84   Pulse 96   Ht 5\' 7"  (1.702 m)   Wt 194 lb (88 kg)   SpO2 98%   BMI 30.38 kg/m    Subjective:   Patient ID: Pam Chen, female    DOB: November 26, 1970, 53 y.o.   MRN: [id number]  HPI: Pam Chen is a 53 y.o. female presenting on 08/15/2023 for Numbness (Right arm and hand. Would like to increase Gabapentin)   HPI Numbness and tingling in right arm and hand Patient has a shooting and numbness and tingling pain on her right side that goes down into her right hand and right elbow.  She says tingling to be numb at different times of the day and it comes and goes and sometimes worse than others and sometimes it wakes her up at night.  She has been taking the gabapentin for it and it was helping but then she ran out of it a couple weeks ago.  She was prescribed 100 mg 3 times a day and she feels it was helping but would consider going up on it.  She denies having any side effects or sedation from it.  Relevant past medical, surgical, family and social history reviewed and updated as indicated. Interim medical history since our last visit reviewed. Allergies and medications reviewed and updated.  Review of Systems  Constitutional:  Negative for chills and fever.  Eyes:  Negative for redness and visual disturbance.  Respiratory:  Negative for chest tightness and shortness of breath.   Cardiovascular:  Negative for chest pain and leg swelling.  Genitourinary:  Negative for difficulty urinating and dysuria.  Musculoskeletal:  Positive for myalgias and neck pain. Negative for back pain and gait problem.  Skin:  Negative for rash.  Neurological:  Positive for numbness. Negative for light-headedness and headaches.  Psychiatric/Behavioral:  Negative for agitation and behavioral problems.   All other systems reviewed and are negative.   Per HPI unless specifically indicated above   Allergies as of 08/15/2023       Reactions   Aspirin Other (See Comments)   Upset tummy.  Can take  ibuprofen.   Oxycodone-acetaminophen Anxiety   Can take vicodin   Tramadol Hcl Nausea Only, Other (See Comments)   And headache And headache        Medication List        Accurate as of August 15, 2023  4:32 PM. If you have any questions, ask your nurse or doctor.          albuterol 108 (90 Base) MCG/ACT inhaler Commonly known as: VENTOLIN HFA TAKE 1-2 PUFFS AS NEEDED FOR WHEEZING/CHEST TIGHTNESS   ARIPiprazole 30 MG tablet Commonly known as: ABILIFY Take 1 tablet (30 mg total) by mouth daily.   FLUoxetine 20 MG capsule Commonly known as: PROZAC Take 3 capsules (60 mg total) by mouth daily.   fluticasone-salmeterol 250-50 MCG/ACT Aepb Commonly known as: Advair Diskus Inhale 1 puff into the lungs in the morning and at bedtime.   furosemide 20 MG tablet Commonly known as: LASIX Take 1 tablet (20 mg total) by mouth daily.   gabapentin 100 MG capsule Commonly known as: NEURONTIN Take 2 capsules (200 mg total) by mouth 3 (three) times daily. What changed:  how much to take additional instructions Changed by: [name] Griff Badley   ibuprofen 800 MG tablet Commonly known as: ADVIL TAKE 1 TABLET BY MOUTH 3 TIMES DAILY AS NEEDED.   mirabegron ER 50 MG Tb24 tablet Commonly known as: Myrbetriq  Take 1 tablet (50 mg total) by mouth daily.   omeprazole 20 MG capsule Commonly known as: PRILOSEC Take 1 capsule (20 mg total) by mouth daily.   ondansetron 4 MG disintegrating tablet Commonly known as: ZOFRAN-ODT Take 1 tablet (4 mg total) by mouth every 8 (eight) hours as needed.   rosuvastatin 10 MG tablet Commonly known as: Crestor Take 1 tablet (10 mg total) by mouth daily.   Suboxone 8-2 MG Film Generic drug: Buprenorphine HCl-Naloxone HCl Place 2 Film under the tongue in the morning and at bedtime.         Objective:   BP 138/84   Pulse 96   Ht 5\' 7"  (1.702 m)   Wt 194 lb (88 kg)   SpO2 98%   BMI 30.38 kg/m   Wt Readings from Last 3 Encounters:   08/15/23 194 lb (88 kg)  07/18/23 207 lb (93.9 kg)  07/04/23 217 lb 6.4 oz (98.6 kg)    Physical Exam Vitals and nursing note reviewed.  Constitutional:      Appearance: Normal appearance. She is obese.  Musculoskeletal:     Cervical back: Normal range of motion. No swelling, deformity, signs of trauma, spasms, tenderness, bony tenderness or crepitus. No pain with movement.  Neurological:     Mental Status: She is alert.       Assessment & Plan:   Problem List Items Addressed This Visit   None Visit Diagnoses       Cervical radiculopathy    -  Primary   Relevant Medications   gabapentin (NEURONTIN) 100 MG capsule       Will have patient increase slowly her gabapentin to take 200 mg 3 times a day but go up initially by 100 mg and give it a few days before she goes up again Follow up plan: Return if symptoms worsen or fail to improve.  Counseling provided for all of the vaccine components No orders of the defined types were placed in this encounter.   Arville Care, MD [facility] 08/15/2023, 4:32 PM

## 2023-08-16 ENCOUNTER — Other Ambulatory Visit: Payer: Self-pay | Admitting: Family Medicine

## 2023-08-16 DIAGNOSIS — R1310 Dysphagia, unspecified: Secondary | ICD-10-CM

## 2023-08-17 ENCOUNTER — Ambulatory Visit: Payer: MEDICAID | Attending: Internal Medicine | Admitting: Internal Medicine

## 2023-08-17 ENCOUNTER — Encounter: Payer: Self-pay | Admitting: Internal Medicine

## 2023-08-17 VITALS — BP 134/84 | HR 78 | Ht 67.0 in | Wt 199.2 lb

## 2023-08-17 DIAGNOSIS — R079 Chest pain, unspecified: Secondary | ICD-10-CM | POA: Diagnosis not present

## 2023-08-17 DIAGNOSIS — I5032 Chronic diastolic (congestive) heart failure: Secondary | ICD-10-CM | POA: Insufficient documentation

## 2023-08-17 DIAGNOSIS — Z79899 Other long term (current) drug therapy: Secondary | ICD-10-CM

## 2023-08-17 DIAGNOSIS — R0609 Other forms of dyspnea: Secondary | ICD-10-CM

## 2023-08-17 MED ORDER — EMPAGLIFLOZIN 10 MG PO TABS: 10.0000 mg | ORAL_TABLET | Freq: Every day | ORAL | 11 refills | Status: AC

## 2023-08-17 NOTE — Progress Notes (Signed)
 Cardiology Office Note  Date: 08/17/2023   ID: Pam Chen, DOB [birth year], MRN [id number]  PCP:  [name], [name], MD  Cardiologist:  Marjo Bicker, MD Electrophysiologist:  None   History of Present Illness: Pam Chen is a 53 y.o. female known to have anxiety, OSA on CPAP, HTN was referred to cardiology clinic for the management of peripheral edema.  Patient self-reported that she has symptoms of DOE and bilateral lower EXTR swelling that was resolved with p.o. Lasix 20 mg once daily a few months ago.  She not have any recurrence of the symptoms after then.  Echocardiogram was performed in February 2025 that showed normal LVEF, normal RV function, G1 DD with normal LVEDP and no valvular heart disease.  She underwent exercise Myoview around 2017 that showed no evidence of ischemia.  She also has chest pains and everyone keeps telling her that it could be from her anxiety.  This chest pains are sharp and last for few seconds.  No other symptoms of dizziness, presyncope, syncope, leg swelling.  No DOE either.  No orthopnea, PND.  Patient requested for TEE to make sure her heart is okay.  Currently on Suboxone.  Past Medical History:  Diagnosis Date   Anxiety    Arthritis    Bipolar disorder (HCC)    Current every day smoker 03/07/2017   Depression    Diverticulitis    GERD (gastroesophageal reflux disease)    Herpes simplex type 1 infection 02/13/2019   Herpes simplex type 2 infection 02/13/2019   History of adenomatous polyp of colon 05/02/2016   Adenoma 05/02/16; repeat 04/2021; Barbaraann Share, MD [facility])   History of bipolar disorder 08/25/2019   History of partial colectomy 03/28/2016   diverticulitis   Hypertension    Hypothyroid 02/02/2017   Kidney stones    Migraine    Obesity (BMI 30.0-34.9) 02/02/2017   Polysubstance dependence including opioid type drug with complication, episodic abuse (HCC) 08/25/2019   Rotator cuff injury    Sleep apnea    CPAP    Tennis elbow    Thyroid disease     Past Surgical History:  Procedure Laterality Date   ABDOMINAL HYSTERECTOMY     COLON SURGERY     diverticulitis   LITHOTRIPSY     SHOULDER ARTHROSCOPY WITH DISTAL CLAVICLE RESECTION Left 12/08/2021   Procedure: SHOULDER ARTHROSCOPY WITH DISTAL CLAVICLE RESECTION;  Surgeon: Oliver Barre, MD;  Location: [facility];  Service: Orthopedics;  Laterality: Left;   SLEEVE GASTROPLASTY      Current Outpatient Medications  Medication Sig Dispense Refill   albuterol (VENTOLIN HFA) 108 (90 Base) MCG/ACT inhaler TAKE 1-2 PUFFS AS NEEDED FOR WHEEZING/CHEST TIGHTNESS 18 g 1   ARIPiprazole (ABILIFY) 30 MG tablet Take 1 tablet (30 mg total) by mouth daily. 90 tablet 3   empagliflozin (JARDIANCE) 10 MG TABS tablet Take 1 tablet (10 mg total) by mouth daily before breakfast. 30 tablet 11   FLUoxetine (PROZAC) 20 MG capsule Take 3 capsules (60 mg total) by mouth daily. 270 capsule 3   fluticasone-salmeterol (ADVAIR DISKUS) 250-50 MCG/ACT AEPB Inhale 1 puff into the lungs in the morning and at bedtime. 180 each 1   furosemide (LASIX) 20 MG tablet Take 1 tablet (20 mg total) by mouth daily. 30 tablet 3   gabapentin (NEURONTIN) 100 MG capsule Take 2 capsules (200 mg total) by mouth 3 (three) times daily. 180 capsule 5   ibuprofen (ADVIL) 800 MG tablet TAKE 1 TABLET  BY MOUTH 3 TIMES DAILY AS NEEDED. 90 tablet 2   mirabegron ER (MYRBETRIQ) 50 MG TB24 tablet Take 1 tablet (50 mg total) by mouth daily. 30 tablet 5   omeprazole (PRILOSEC) 20 MG capsule TAKE 1 CAPSULE BY MOUTH EVERY DAY 90 capsule 1   ondansetron (ZOFRAN-ODT) 4 MG disintegrating tablet Take 1 tablet (4 mg total) by mouth every 8 (eight) hours as needed. 20 tablet 0   rosuvastatin (CRESTOR) 10 MG tablet Take 1 tablet (10 mg total) by mouth daily. 90 tablet 3   SUBOXONE 8-2 MG FILM Place 2 Film under the tongue in the morning and at bedtime.     No current facility-administered medications for this visit.    Allergies:  Aspirin, Oxycodone-acetaminophen, and Tramadol hcl   Social History: The patient  reports that she has been smoking e-cigarettes. She has never used smokeless tobacco. She reports that she does not currently use drugs after having used the following drugs: Methamphetamines. She reports that she does not drink alcohol.   Family History: The patient's family history includes Alzheimer's disease in her maternal grandmother; Breast cancer in her maternal grandmother; Cancer in her maternal grandmother and paternal grandmother; Diabetes in her maternal grandfather and paternal grandfather; Healthy in her mother; Heart disease in her paternal grandfather; Hyperlipidemia in her brother; Hypertension in her father and paternal grandfather; Sleep apnea in her father.   ROS:  Please see the history of present illness. Otherwise, complete review of systems is positive for none  All other systems are reviewed and negative.   Physical Exam: VS:  BP 134/84 (BP Location: Left Arm, Patient Position: Sitting, Cuff Size: Normal)   Pulse 78   Ht 5\' 7"  (1.702 m)   Wt 199 lb 3.2 oz (90.4 kg)   SpO2 98%   BMI 31.20 kg/m , BMI Body mass index is 31.2 kg/m.  Wt Readings from Last 3 Encounters:  08/17/23 199 lb 3.2 oz (90.4 kg)  08/15/23 194 lb (88 kg)  07/18/23 207 lb (93.9 kg)    General: Patient appears comfortable at rest. HEENT: Conjunctiva and lids normal, oropharynx clear with moist mucosa. Neck: Supple, no elevated JVP or carotid bruits, no thyromegaly. Lungs: Clear to auscultation, nonlabored breathing at rest. Cardiac: Regular rate and rhythm, no S3 or significant systolic murmur, no pericardial rub. Abdomen: Soft, nontender, no hepatomegaly, bowel sounds present, no guarding or rebound. Extremities: No pitting edema, distal pulses 2+. Skin: Warm and dry. Musculoskeletal: No kyphosis. Neuropsychiatric: Alert and oriented x3, affect grossly appropriate.  Recent Labwork: 06/01/2023:  BNP 58.4 07/18/2023: ALT 10; AST 13; BUN 13; Creatinine, Ser 0.92; Hemoglobin 11.3; Platelets 301; Potassium 4.1; Sodium 142; TSH 2.610     Component Value Date/Time   CHOL 145 07/18/2023 0931   TRIG 109 07/18/2023 0931   HDL 46 07/18/2023 0931   CHOLHDL 3.2 07/18/2023 0931   LDLCALC 79 07/18/2023 0931    Assessment and Plan:   Chronic diastolic heart failure: She had symptoms of DOE and bilateral lower EXTR swelling couple of months ago that resolved with p.o. Lasix 20 mg once daily.  Currently compensated.  Continue p.o. Lasix 20 mg once daily and start Farxiga or Jardiance 10 mg once daily.  Obtain BMP in 5 days.  Echo will obtain exercise Myoview for ischemia evaluation.  Chest pain: Likely secondary to anxiety but cannot rule out ischemia due to prior history of diastolic heart failure exacerbation.  Will obtain exercise Myoview as stated above.  Echocardiogram in 06/2023  was unremarkable.   Patient requested for TEE to make sure her heart is okay.  Echocardiogram is unremarkable.  No indication for TEE.       Medication Adjustments/Labs and Tests Ordered: Current medicines are reviewed at length with the patient today.  Concerns regarding medicines are outlined above.    Disposition:  Follow up  1 year  Signed Caillou Minus Verne Spurr, MD, 08/17/2023 11:59 AM    [facility] [address] [place], [place], [place] [address]

## 2023-08-17 NOTE — Patient Instructions (Signed)
 Medication Instructions:  Your physician has recommended you make the following change in your medication:   -Start Jardiance 10 mg daily   *If you need a refill on your cardiac medications before your next appointment, please call your pharmacy*   Lab Work: In 5 days: -BMP  If you have labs (blood work) drawn today and your tests are completely normal, you will receive your results only by: MyChart Message (if you have MyChart) OR A paper copy in the mail If you have any lab test that is abnormal or we need to change your treatment, we will call you to review the results.   Testing/Procedures: Your physician has requested that you have en exercise stress myoview. For further information please visit [email or web address] Please follow instruction sheet, as given.    Follow-Up: At [facility], you and your health needs are our priority.  As part of our continuing mission to provide you with exceptional heart care, we have created designated Provider Care Teams.  These Care Teams include your primary Cardiologist (physician) and Advanced Practice Providers (APPs -  Physician Assistants and Nurse Practitioners) who all work together to provide you with the care you need, when you need it.  We recommend signing up for the patient portal called "MyChart".  Sign up information is provided on this After Visit Summary.  MyChart is used to connect with patients for Virtual Visits (Telemedicine).  Patients are able to view lab/test results, encounter notes, upcoming appointments, etc.  Non-urgent messages can be sent to your provider as well.   To learn more about what you can do with MyChart, go to [email or web address].    Your next appointment:   1 year(s)  Provider:   You may see Vishnu P Mallipeddi, MD or one of the following Advanced Practice Providers on your designated Care Team:   Sharlene Dory, NP  Other Instructions

## 2023-08-20 ENCOUNTER — Telehealth: Payer: Self-pay | Admitting: Pharmacy Technician

## 2023-08-20 ENCOUNTER — Other Ambulatory Visit (HOSPITAL_COMMUNITY): Payer: Self-pay

## 2023-08-20 NOTE — Telephone Encounter (Signed)
 Pharmacy Patient Advocate Encounter  Received notification from [employer] that Prior Authorization for Pam Chen has been APPROVED from 08/19/23 to 08/18/24. Ran test claim, Copay is $4.00- one month. This test claim was processed through [facility]- copay amounts may vary at other pharmacies due to pharmacy/plan contracts, or as the patient moves through the different stages of their insurance plan.   PA #/Case ID/Reference #: [id number]

## 2023-08-20 NOTE — Telephone Encounter (Signed)
 Pharmacy Patient Advocate Encounter   Received notification from CoverMyMeds that prior authorization for Jardiance is required/requested.   Insurance verification completed.   The patient is insured through [employer] [place] .   Per test claim: PA required; PA submitted to above mentioned insurance via CoverMyMeds Key/confirmation #/EOC [id number] Status is pending

## 2023-08-27 ENCOUNTER — Encounter (HOSPITAL_COMMUNITY): Payer: MEDICAID

## 2023-08-27 ENCOUNTER — Other Ambulatory Visit: Payer: Self-pay | Admitting: Nurse Practitioner

## 2023-08-27 ENCOUNTER — Encounter (HOSPITAL_COMMUNITY): Admission: RE | Admit: 2023-08-27 | Payer: MEDICAID | Source: Ambulatory Visit

## 2023-08-27 DIAGNOSIS — R600 Localized edema: Secondary | ICD-10-CM

## 2023-09-03 ENCOUNTER — Ambulatory Visit (HOSPITAL_COMMUNITY)
Admission: RE | Admit: 2023-09-03 | Discharge: 2023-09-03 | Disposition: A | Discharge status: Home / Self Care | Payer: MEDICAID | Source: Ambulatory Visit | Attending: Internal Medicine | Admitting: Internal Medicine

## 2023-09-03 ENCOUNTER — Encounter (HOSPITAL_COMMUNITY)
Admission: RE | Admit: 2023-09-03 | Discharge: 2023-09-03 | Disposition: A | Discharge status: Home / Self Care | Payer: MEDICAID | Source: Ambulatory Visit | Attending: Internal Medicine | Admitting: Internal Medicine

## 2023-09-03 DIAGNOSIS — R0609 Other forms of dyspnea: Secondary | ICD-10-CM | POA: Diagnosis present

## 2023-09-03 DIAGNOSIS — R079 Chest pain, unspecified: Secondary | ICD-10-CM | POA: Insufficient documentation

## 2023-09-03 LAB — NM MYOCAR MULTI W/SPECT W/WALL MOTION / EF
Angina Index: 0
Duke Treadmill Score: 8
Estimated workload: 10.1
Exercise duration (min): 8 min
Exercise duration (sec): 10 s
LV dias vol: 74 mL (ref 46–106)
LV sys vol: 18 mL
MPHR: 170 {beats}/min
Nuc Stress EF: 75 %
Peak HR: 155 {beats}/min
Percent HR: 91 %
RATE: 0.4
RPE: 18
Rest HR: 74 {beats}/min
Rest Nuclear Isotope Dose: 11 mCi
SDS: 2
SRS: 5
SSS: 7
ST Depression (mm): 0 mm
Stress Nuclear Isotope Dose: 30 mCi
TID: 1.02

## 2023-09-03 MED ORDER — SODIUM CHLORIDE FLUSH 0.9 % IV SOLN
INTRAVENOUS | Status: AC
  Administered 2023-09-03: 10 mL via INTRAVENOUS
  Filled 2023-09-03: qty 10

## 2023-09-03 MED ORDER — TECHNETIUM TC 99M TETROFOSMIN IV KIT
30.0000 | PACK | Freq: Once | INTRAVENOUS | Status: AC | PRN
  Administered 2023-09-03: 30 via INTRAVENOUS

## 2023-09-03 MED ORDER — REGADENOSON 0.4 MG/5ML IV SOLN
INTRAVENOUS | Status: AC
  Filled 2023-09-03: qty 5

## 2023-09-03 MED ORDER — TECHNETIUM TC 99M TETROFOSMIN IV KIT
10.0000 | PACK | Freq: Once | INTRAVENOUS | Status: AC | PRN
  Administered 2023-09-03: 10 via INTRAVENOUS

## 2023-09-28 ENCOUNTER — Encounter: Payer: Self-pay | Admitting: Radiology

## 2023-10-01 ENCOUNTER — Ambulatory Visit (INDEPENDENT_AMBULATORY_CARE_PROVIDER_SITE_OTHER): Payer: MEDICAID | Admitting: Family Medicine

## 2023-10-01 ENCOUNTER — Encounter: Payer: Self-pay | Admitting: Family Medicine

## 2023-10-01 VITALS — BP 134/80 | HR 92 | Ht 67.0 in | Wt 197.0 lb

## 2023-10-01 DIAGNOSIS — S20362A Insect bite (nonvenomous) of left front wall of thorax, initial encounter: Secondary | ICD-10-CM | POA: Diagnosis not present

## 2023-10-01 DIAGNOSIS — W57XXXA Bitten or stung by nonvenomous insect and other nonvenomous arthropods, initial encounter: Secondary | ICD-10-CM

## 2023-10-01 MED ORDER — DOXYCYCLINE HYCLATE 100 MG PO TABS: 100.0000 mg | ORAL_TABLET | Freq: Two times a day (BID) | ORAL | 0 refills | Status: DC

## 2023-10-01 NOTE — Progress Notes (Signed)
 BP 134/80   Pulse 92   Ht 5\' 7"  (1.702 m)   Wt 197 lb (89.4 kg)   SpO2 100%   BMI 30.85 kg/m    Subjective:   Patient ID: Pam Chen, female    DOB: 01-23-1971, 53 y.o.   MRN: [id number]  HPI: Pam Chen is a 53 y.o. female presenting on 10/01/2023 for Insect Bite   HPI Tick bite left axilla Patient is coming in for tick bite that she noticed on her left axilla.  She said they pulled off 2 days ago but initially it was a small red bump but now it started spreading to a larger area and start turning somewhat purple as well.  She denies any fevers or chills or bodyaches.  She does have a little bit of fatigue and generally just feeling ill but she is also recovering from another illness.  Relevant past medical, surgical, family and social history reviewed and updated as indicated. Interim medical history since our last visit reviewed. Allergies and medications reviewed and updated.  Review of Systems  Constitutional:  Positive for fatigue. Negative for chills and fever.  HENT:  Negative for congestion, ear discharge and ear pain.   Eyes:  Negative for visual disturbance.  Respiratory:  Negative for chest tightness and shortness of breath.   Cardiovascular:  Negative for chest pain and leg swelling.  Genitourinary:  Negative for difficulty urinating and dysuria.  Musculoskeletal:  Negative for arthralgias, back pain, gait problem and myalgias.  Skin:  Positive for rash.  Neurological:  Negative for light-headedness and headaches.  Psychiatric/Behavioral:  Negative for agitation and behavioral problems.   All other systems reviewed and are negative.   Per HPI unless specifically indicated above   Allergies as of 10/01/2023       Reactions   Aspirin Other (See Comments)   Upset tummy.  Can take ibuprofen .   Oxycodone-acetaminophen  Anxiety   Can take vicodin   Tramadol Hcl Nausea Only, Other (See Comments)   And headache And headache        Medication List         Accurate as of Oct 01, 2023 11:39 AM. If you have any questions, ask your nurse or doctor.          albuterol  108 (90 Base) MCG/ACT inhaler Commonly known as: VENTOLIN  HFA TAKE 1-2 PUFFS AS NEEDED FOR WHEEZING/CHEST TIGHTNESS   ARIPiprazole  30 MG tablet Commonly known as: ABILIFY  Take 1 tablet (30 mg total) by mouth daily.   doxycycline  100 MG tablet Commonly known as: VIBRA -TABS Take 1 tablet (100 mg total) by mouth 2 (two) times daily. 1 po bid   empagliflozin  10 MG Tabs tablet Commonly known as: Jardiance  Take 1 tablet (10 mg total) by mouth daily before breakfast.   FLUoxetine  20 MG capsule Commonly known as: PROZAC  Take 3 capsules (60 mg total) by mouth daily.   fluticasone -salmeterol 250-50 MCG/ACT Aepb Commonly known as: Advair Diskus Inhale 1 puff into the lungs in the morning and at bedtime.   furosemide  20 MG tablet Commonly known as: LASIX  TAKE 1 TABLET BY MOUTH EVERY DAY   gabapentin  100 MG capsule Commonly known as: NEURONTIN  Take 2 capsules (200 mg total) by mouth 3 (three) times daily.   ibuprofen  800 MG tablet Commonly known as: ADVIL  TAKE 1 TABLET BY MOUTH 3 TIMES DAILY AS NEEDED.   mirabegron  ER 50 MG Tb24 tablet Commonly known as: Myrbetriq  Take 1 tablet (50 mg total) by mouth daily.  omeprazole  20 MG capsule Commonly known as: PRILOSEC TAKE 1 CAPSULE BY MOUTH EVERY DAY   ondansetron  4 MG disintegrating tablet Commonly known as: ZOFRAN -ODT Take 1 tablet (4 mg total) by mouth every 8 (eight) hours as needed.   rosuvastatin  10 MG tablet Commonly known as: Crestor  Take 1 tablet (10 mg total) by mouth daily.   Suboxone 8-2 MG Film Generic drug: Buprenorphine HCl-Naloxone HCl Place 2 Film under the tongue in the morning and at bedtime.         Objective:   BP 134/80   Pulse 92   Ht 5\' 7"  (1.702 m)   Wt 197 lb (89.4 kg)   SpO2 100%   BMI 30.85 kg/m   Wt Readings from Last 3 Encounters:  10/01/23 197 lb (89.4 kg)  08/17/23 199  lb 3.2 oz (90.4 kg)  08/15/23 194 lb (88 kg)    Physical Exam Vitals and nursing note reviewed.  Constitutional:      Appearance: Normal appearance. She is normal weight.  Skin:    Findings: Rash present.     Comments: Rash under left axilla with the central eschar, more of a linear purple discoloration about 4 cm long in a horizontal pattern on her left axilla.  Slightly tender and indurated to palpation  Neurological:     Mental Status: She is alert.       Assessment & Plan:   Problem List Items Addressed This Visit   None Visit Diagnoses       Tick bite of left front wall of thorax, initial encounter    -  Primary   Relevant Medications   doxycycline  (VIBRA -TABS) 100 MG tablet       Will go ahead and treat based on appearance as possible Lyme.  Will give her the doxycycline . Follow up plan: Return if symptoms worsen or fail to improve.  Counseling provided for all of the vaccine components No orders of the defined types were placed in this encounter.   Jolyne Needs, MD [facility] 10/01/2023, 11:39 AM

## 2023-10-11 ENCOUNTER — Ambulatory Visit: Payer: Self-pay | Admitting: *Deleted

## 2023-10-11 NOTE — Telephone Encounter (Signed)
 Appointment set. Call closed.

## 2023-10-11 NOTE — Telephone Encounter (Signed)
 Copied from CRM [id number]. Topic: Clinical - Red Word Triage >> Oct 11, 2023  1:48 PM Pam Chen wrote: Red Word that prompted transfer to Nurse Triage: feeling worse since  seen Pam Chen on 10/01/23 due to a tick bite friday 09/28/23    feel really nausea comes ago  since last week no energy been in bed for 2 days and  achy in joints almost feel like flu symptoms , patient curious is symptoms coming from the tick bite Patient call back number [phone] Reason for Disposition  Nausea lasts > 1 week  Answer Assessment - Initial Assessment Questions 1. NAUSEA SEVERITY: "How bad is the nausea?" (e.g., mild, moderate, severe; dehydration, weight loss)   - MILD: loss of appetite without change in eating habits   - MODERATE: decreased oral intake without significant weight loss, dehydration, or malnutrition   - SEVERE: inadequate caloric or fluid intake, significant weight loss, symptoms of dehydration     Pt bitten by a tick on Friday 09/28/2023.   Saw Dr. Steen Eden on 10/01/2023 because the spot of spreading.  He put me on doxycyclinefor 3 weeks.   The bite mark looks better.   I'm just not feeling better. 2. ONSET: "When did the nausea begin?"     I'm having a lot of nausea, fatigue on and off, I've been in bed since yesterday, aching in my joints worse than normal.     3. VOMITING: "Any vomiting?" If Yes, ask: "How many times today?"     No vomiting 4. RECURRENT SYMPTOM: "Have you had nausea before?" If Yes, ask: "When was the last time?" "What happened that time?"     No 5. CAUSE: "What do you think is causing the nausea?"     I'm wondering if I have a tick disease 6. PREGNANCY: "Is there any chance you are pregnant?" (e.g., unprotected intercourse, missed birth control pill, broken condom)     N/A  Protocols used: Nausea-A-AH  Chief Complaint: Bitten by a tick on 09/28/2023.   Seen on 5/5 and started on Doxycycline . Symptoms: Feeling worse.  A lot of nausea, fatigue, joint aches and  wondering if this is from tick fever of some kind.   Headaches too. Frequency: Since bitten by a tick and not feeling any better.   Actually feeling worse.   Very severe fatigue and headaches too.   Taking ibuprofen . Pertinent Negatives: Patient denies fever Disposition: [] ED /[] Urgent Care (no appt availability in office) / [x] Appointment(In office/virtual)/ []  [facility]/ [] Home Care/ [] Refused Recommended Disposition /[] [facility]/ []  Follow-up with PCP Additional Notes: Appt made for 10/12/2023 with doctor of the day in the morning.

## 2023-10-12 ENCOUNTER — Encounter: Payer: Self-pay | Admitting: Family

## 2023-10-12 ENCOUNTER — Ambulatory Visit: Payer: MEDICAID | Admitting: Family

## 2023-10-12 VITALS — BP 145/89 | HR 66 | Temp 97.2°F | Ht 67.0 in | Wt 200.2 lb

## 2023-10-12 DIAGNOSIS — R531 Weakness: Secondary | ICD-10-CM | POA: Diagnosis not present

## 2023-10-12 DIAGNOSIS — M2550 Pain in unspecified joint: Secondary | ICD-10-CM | POA: Diagnosis not present

## 2023-10-12 DIAGNOSIS — S20362D Insect bite (nonvenomous) of left front wall of thorax, subsequent encounter: Secondary | ICD-10-CM

## 2023-10-12 DIAGNOSIS — W57XXXD Bitten or stung by nonvenomous insect and other nonvenomous arthropods, subsequent encounter: Secondary | ICD-10-CM | POA: Diagnosis not present

## 2023-10-12 NOTE — Addendum Note (Signed)
 Addended by: Rudolfo Cosier C on: 10/12/2023 11:07 AM   Modules accepted: Orders

## 2023-10-12 NOTE — Progress Notes (Signed)
 Subjective:    Patient ID: Pam Chen, female    DOB: [birth year], 53 y.o.   MRN: [id number]  Chief Complaint  Patient presents with   Joint Pain   Fatigue   Weakness    Patient states she was seen 5/5 for tick bite and her symptoms have gotten worse.    PT presents to the office today with weakness, nausea, fatigue, and joint pain. She had a tick bite on 09/29/23 on left axilla. She saw her PCP on 10/01/23 and given doxycyline 100 mg BID for 21 days.   Reports she is feeling worse.   Weakness This is a new problem. The current episode started 1 to 4 weeks ago. The problem has been gradually worsening. Associated symptoms include chills, fatigue, headaches, myalgias, nausea, vertigo and weakness. Pertinent negatives include no congestion, coughing, fever, rash, sore throat, urinary symptoms or vomiting. She has tried rest (doxycyline) for the symptoms. The treatment provided mild relief.      Review of Systems  Constitutional:  Positive for chills and fatigue. Negative for fever.  HENT:  Negative for congestion and sore throat.   Respiratory:  Negative for cough.   Gastrointestinal:  Positive for nausea. Negative for vomiting.  Musculoskeletal:  Positive for myalgias.  Skin:  Negative for rash.  Neurological:  Positive for vertigo, weakness and headaches.  All other systems reviewed and are negative.   Social History   Socioeconomic History   Marital status: Single    Spouse name: Not on file   Number of children: 3   Years of education: GED   Highest education level: Not on file  Occupational History   Occupation: drives fork lift/loads trucks in [employer]  Tobacco Use   Smoking status: Some Days    Types: E-cigarettes   Smokeless tobacco: Never  Vaping Use   Vaping status: Some Days  Substance and Sexual Activity   Alcohol  use: No   Drug use: Not Currently    Types: Methamphetamines   Sexual activity: Not on file  Other Topics Concern   Not on file   Social History Narrative   Lives at home with fiance and son.   Right-handed.   2 cups coffee, 1 glass of sweet tea per day.   Social Drivers of Corporate investment banker Strain: Not on file  Food Insecurity: Not on file  Transportation Needs: Not on file  Physical Activity: Not on file  Stress: Stress Concern Present (11/11/2021)   Received from [facility], [facility]   [employer] [facility] - Occupational Stress Questionnaire    Feeling of Stress : To some extent  Social Connections: Unknown (09/28/2021)   Received from [facility], [facility]   Social Network    Social Network: Not on file   Family History  Problem Relation Age of Onset   Healthy Mother    Hypertension Father    Sleep apnea Father    Breast cancer Maternal Grandmother    Cancer Maternal Grandmother        breast   Alzheimer's disease Maternal Grandmother    Diabetes Maternal Grandfather    Cancer Paternal Grandmother        ovarian   Hypertension Paternal Grandfather    Heart disease Paternal Grandfather    Diabetes Paternal Grandfather    Hyperlipidemia Brother         Objective:   Physical Exam Vitals reviewed.  Constitutional:      General: She is not  in acute distress.    Appearance: She is well-developed.  HENT:     Head: Normocephalic and atraumatic.  Eyes:     Pupils: Pupils are equal, round, and reactive to light.  Neck:     Thyroid: No thyromegaly.  Cardiovascular:     Rate and Rhythm: Normal rate and regular rhythm.     Heart sounds: Normal heart sounds. No murmur heard. Pulmonary:     Effort: Pulmonary effort is normal. No respiratory distress.     Breath sounds: Normal breath sounds. No wheezing.  Abdominal:     General: Bowel sounds are normal. There is no distension.     Palpations: Abdomen is soft.     Tenderness: There is no abdominal tenderness.  Musculoskeletal:        General: No tenderness. Normal range of motion.     Cervical back:  Normal range of motion and neck supple.  Skin:    General: Skin is warm and dry.          Comments: Small erythemas of left axilla approx 0.2X0.2 cm, no tenderness, rash,, warmth noted  Neurological:     Mental Status: She is alert and oriented to person, place, and time.     Cranial Nerves: No cranial nerve deficit.     Deep Tendon Reflexes: Reflexes are normal and symmetric.  Psychiatric:        Behavior: Behavior normal.        Thought Content: Thought content normal.        Judgment: Judgment normal.       BP (!) 145/89   Pulse 66   Temp (!) 97.2 F (36.2 C)   Ht 5\' 7"  (1.702 m)   Wt 200 lb 3.2 oz (90.8 kg)   SpO2 100%   BMI 31.36 kg/m      Assessment & Plan:  Wynetta Faddis comes in today with chief complaint of Joint Pain, Fatigue, and Weakness (Patient states she was seen 5/5 for tick bite and her symptoms have gotten worse. )   Diagnosis and orders addressed:  1. Tick bite of left front wall of thorax, subsequent encounter (Primary) - CMP14+EGFR - CBC with Differential/Platelet - Rocky mtn spotted fvr abs pnl(IgG+IgM) - Lyme Disease Serology w/Reflex  2. Weakness - CMP14+EGFR - CBC with Differential/Platelet  3. Generalized joint pain - CMP14+EGFR - CBC with Differential/Platelet - Rocky mtn spotted fvr abs pnl(IgG+IgM) - Lyme Disease Serology w/Reflex   Labs pending Given increased weakness want to rule out of signs of infection Force fluids Rest Continue current medications  Tylenol  as needed Follow up if symptoms worsen or do not improve     Tommas Fragmin, FNP

## 2023-10-12 NOTE — Patient Instructions (Signed)
Tick Bite Information, Adult  Ticks are insects that draw blood for food. They climb onto people and animals that brush against the leaves and grasses that they live in. They then bite and attach to the skin. Most ticks are harmless, but some ticks may carry germs that can cause disease. These germs are spread to a person through a bite. To lower your risk of getting a disease from a tick bite, make sure you: Take steps to prevent tick bites. Check for ticks after being outdoors where ticks live. Watch for symptoms of disease if a tick attached to you or if you think a tick bit you. How can I prevent tick bites? Take these steps to help prevent tick bites when you go outdoors in an area where ticks live: Before you go outdoors: Wear long sleeves and long pants to protect your skin from ticks. Wear light-colored clothing so you can see ticks easier. Tuck your pant legs into your socks. Apply insect repellent that has DEET (20% or higher), picaridin, or IR3535 in it to the following areas: Any bare skin. Avoid areas around the eyes and mouth. Edges of clothing, like the top of your boots, the bottom of your pant legs, and your sleeve cuffs. Consider applying an insect repellant that contains permethrin. Follow the instructions on the label. Do not apply permethrin directly to the skin. Instead, apply to the following areas: Clothing and shoes. Outdoor gear and tents. When you are outdoors: Avoid walking through areas with long grass. If you are walking on a trail, stay in the middle of the trail so your skin, hair, and clothing do not touch the bushes. Check for ticks on your clothing, hair, and skin often while you are outdoors. Check again before you go inside. When you go indoors: Check your clothing for ticks. Tumble dry clothes in a dryer on high heat for at least 10 minutes. If clothes are damp, additional time may be needed. If clothes require washing, use hot water. Check your gear and  pets. Shower soon after being outdoors. Check your body for ticks. Do a full body check using a mirror. Be sure to check your scalp, neck, armpits, waist, groin, and joint areas. These are the spots where ticks attach themselves most often. What is the best way to remove a tick?  Remove the tick as soon as possible. Removing it can prevent germs from passing to your body. Do not remove the tick with your bare fingers. Do not try to remove a tick with heat, alcohol, petroleum jelly, or fingernail polish. These things can cause the tick to salivate and regurgitate into your bloodstream, increasing your risk of getting a disease. To remove a tick that is crawling on your skin: Go outside and brush the tick off. Use tape or a lint roller. To remove a tick that is attached to your skin: Wash your hands. If you have gloves, put them on. Use a fine-tipped tweezer, curved forceps, or a tick-removal tool to gently grasp the tick as close to your skin and the tick's head as possible. Gently pull with a steady, upward, and even pressure until the tick lets go. While removing the tick: Take care to keep the tick's head attached to its body. Do not twist or jerk the tick. This can make the tick's head or mouth parts break off and stay in your skin. If this happens, try to remove the mouth parts with tweezers. If you cannot remove them, leave   the area alone and let the skin heal. Do not squeeze or crush the tick's body. This could force disease-carrying fluids from the tick into your body. What should I do after removing a tick? Clean the bite area and your hands with soap and water, rubbing alcohol, or an iodine scrub. If an antiseptic cream or ointment is available, put a small amount on the bite area. Wash and disinfect any tools that you used to remove the tick. How should I dispose of a tick? To dispose of a live tick, use one of these methods: Place it in rubbing alcohol. Place it in a sealed bag  or container, and throw it away. Wrap it tightly in tape, and throw it away. Flush it down the toilet. Where to find more information Centers for Disease Control and Prevention: cdc.gov/ticks U.[place]. Environmental Protection Agency: epa.gov/insect-repellents Contact a health care provider if: You have symptoms of a disease after a tick bite. Symptoms of a tick-borne disease can occur from moments after the tick bites to 30 days after a tick is removed. Symptoms include: Fever or chills. A red rash that makes a circle (bull's-eye rash) in the bite area. Redness and swelling in the bite area. Headache or stiff neck. Muscle, joint, or bone pain. Abnormal tiredness. Numbness in your legs or trouble walking or moving your legs. Tender or swollen lymph glands. Abdominal pain, vomiting, diarrhea, or weight loss. Get help right away if: You are not able to remove a tick. You have muscle weakness or paralysis. Your symptoms get worse or you experience new symptoms. You find an engorged tick on your skin and you are in an area where there is a higher risk of disease from ticks. Summary Ticks may carry germs that can spread to a person through a bite. These germs can cause disease. Wear protective clothing and use insect repellent to prevent tick bites. Follow the instructions on the label. If you find a tick on your body, remove it as soon as possible. If the tick is attached, do not try to remove it with heat, alcohol, petroleum jelly, or fingernail polish. If you have symptoms of a disease after being bitten by a tick, contact a health care provider. This information is not intended to replace advice given to you by your health care provider. Make sure you discuss any questions you have with your health care provider. Document Revised: 08/15/2021 Document Reviewed: 08/15/2021 Elsevier Patient Education  2024 Elsevier Inc.  

## 2023-10-13 LAB — CBC WITH DIFFERENTIAL/PLATELET
Basophils Absolute: 0 10*3/uL (ref 0.0–0.2)
Basos: 0 %
EOS (ABSOLUTE): 0 10*3/uL (ref 0.0–0.4)
Eos: 1 %
Hematocrit: 41.4 % (ref 34.0–46.6)
Hemoglobin: 13.3 g/dL (ref 11.1–15.9)
Immature Grans (Abs): 0 10*3/uL (ref 0.0–0.1)
Immature Granulocytes: 0 %
Lymphocytes Absolute: 1.2 10*3/uL (ref 0.7–3.1)
Lymphs: 28 %
MCH: 26.8 pg (ref 26.6–33.0)
MCHC: 32.1 g/dL (ref 31.5–35.7)
MCV: 83 fL (ref 79–97)
Monocytes Absolute: 0.3 10*3/uL (ref 0.1–0.9)
Monocytes: 6 %
Neutrophils Absolute: 2.9 10*3/uL (ref 1.4–7.0)
Neutrophils: 65 %
Platelets: 259 10*3/uL (ref 150–450)
RBC: 4.97 x10E6/uL (ref 3.77–5.28)
RDW: 15.6 % — ABNORMAL HIGH (ref 11.7–15.4)
WBC: 4.5 10*3/uL (ref 3.4–10.8)

## 2023-10-13 LAB — CMP14+EGFR
ALT: 11 IU/L (ref 0–32)
AST: 15 IU/L (ref 0–40)
Albumin: 3.8 g/dL — ABNORMAL LOW (ref 3.9–4.9)
Alkaline Phosphatase: 93 IU/L (ref 44–121)
BUN/Creatinine Ratio: 12 (ref 9–23)
BUN: 9 mg/dL (ref 6–24)
Bilirubin Total: 0.2 mg/dL (ref 0.0–1.2)
CO2: 22 mmol/L (ref 20–29)
Calcium: 9.3 mg/dL (ref 8.7–10.2)
Chloride: 101 mmol/L (ref 96–106)
Creatinine, Ser: 0.75 mg/dL (ref 0.57–1.00)
Globulin, Total: 2.6 g/dL (ref 1.5–4.5)
Glucose: 110 mg/dL — ABNORMAL HIGH (ref 70–99)
Potassium: 4.1 mmol/L (ref 3.5–5.2)
Sodium: 141 mmol/L (ref 134–144)
Total Protein: 6.4 g/dL (ref 6.0–8.5)
eGFR: 97 mL/min/{1.73_m2} (ref >59)

## 2023-10-15 ENCOUNTER — Ambulatory Visit: Payer: Self-pay | Admitting: Family

## 2023-10-15 LAB — LYME DISEASE SEROLOGY W/REFLEX: Lyme Total Antibody EIA: NEGATIVE

## 2023-10-16 LAB — SPOTTED FEVER GROUP ANTIBODIES
Spotted Fever Group IgG: <1:64 {titer}
Spotted Fever Group IgM: <1:64 {titer}

## 2023-10-18 ENCOUNTER — Other Ambulatory Visit: Payer: Self-pay | Admitting: Family Medicine

## 2024-01-06 ENCOUNTER — Other Ambulatory Visit: Payer: Self-pay | Admitting: Family Medicine

## 2024-01-16 ENCOUNTER — Ambulatory Visit: Payer: MEDICAID | Admitting: Family Medicine

## 2024-01-17 ENCOUNTER — Encounter: Payer: Self-pay | Admitting: Family Medicine

## 2024-02-07 ENCOUNTER — Other Ambulatory Visit: Payer: Self-pay | Admitting: Family Medicine

## 2024-03-05 ENCOUNTER — Other Ambulatory Visit: Payer: Self-pay | Admitting: Family Medicine

## 2024-04-22 ENCOUNTER — Other Ambulatory Visit: Payer: Self-pay | Admitting: Family Medicine

## 2024-04-22 DIAGNOSIS — R600 Localized edema: Secondary | ICD-10-CM

## 2024-04-22 NOTE — Telephone Encounter (Unsigned)
 Copied from CRM #[id number]. Topic: Clinical - Medication Refill >> Apr 22, 2024  4:11 PM Nathanel BROCKS wrote: Medication: furosemide  (LASIX ) 20 MG tablet  Has the patient contacted their pharmacy? Yes   This is the patient's preferred pharmacy:  [id number] - [place], [place] - [address] [place] [address] Phone: [phone] Fax: [phone]  Is this the correct pharmacy for this prescription? Yes If no, delete pharmacy and type the correct one.   Has the prescription been filled recently? Yes  Is the patient out of the medication? Yes  Has the patient been seen for an appointment in the last year OR does the patient have an upcoming appointment? Yes  Can we respond through MyChart? Yes  Agent: Please be advised that Rx refills may take up to 3 business days. We ask that you follow-up with your pharmacy.

## 2024-04-23 MED ORDER — FUROSEMIDE 20 MG PO TABS: 20.0000 mg | ORAL_TABLET | Freq: Every day | ORAL | 1 refills | Status: AC

## 2024-05-03 ENCOUNTER — Other Ambulatory Visit: Payer: Self-pay | Admitting: Family Medicine

## 2024-05-03 DIAGNOSIS — R1310 Dysphagia, unspecified: Secondary | ICD-10-CM

## 2024-06-09 ENCOUNTER — Ambulatory Visit: Payer: MEDICAID | Admitting: Family Medicine

## 2024-06-09 ENCOUNTER — Ambulatory Visit: Payer: Self-pay

## 2024-06-09 ENCOUNTER — Encounter: Payer: Self-pay | Admitting: Family Medicine

## 2024-06-09 VITALS — BP 119/83 | HR 77 | Ht 67.0 in | Wt 204.0 lb

## 2024-06-09 DIAGNOSIS — K5903 Drug induced constipation: Secondary | ICD-10-CM | POA: Diagnosis not present

## 2024-06-09 MED ORDER — LACTULOSE 10 GM/15ML PO SOLN: 20.0000 g | Freq: Two times a day (BID) | ORAL | 0 refills | Status: AC | PRN

## 2024-06-09 NOTE — Telephone Encounter (Signed)
 FYI Only or Action Required?: FYI only for provider: appointment scheduled on 06/09/2024.  Patient was last seen in primary care on 10/12/2023 by Lavell Bari LABOR, FNP.  Called Nurse Triage reporting Constipation.  Symptoms began about a month ago.  Interventions attempted: Prescription medications: mag citrate.  Symptoms are: gradually worsening.  Triage Disposition: See Physician Within 24 Hours  Patient/caregiver understands and will follow disposition?: Yes  Reason for Disposition  Last bowel movement (BM) > 4 days ago  Answer Assessment - Initial Assessment Questions 1. STOOL PATTERN OR FREQUENCY: How often do you have a bowel movement (BM)?  (Normal range: 3 times a day to every 3 days)  When was your last BM?       *No Answer* 2. STRAINING: Do you have to strain to have a BM?      *No Answer* 3. ONSET: When did the constipation begin?     At least three weeks ago 4. RECTAL PAIN: Does your rectum hurt when the stool comes out? If Yes, ask: Do you have hemorrhoids? How bad is the pain?  (Scale 1-10; or mild, moderate, severe)     Constant pain and rectal pressure 5. BM COMPOSITION: Are the stools hard?      *No Answer* 6. BLOOD ON STOOLS: Has there been any blood on the toilet tissue or on the surface of the BM? If Yes, ask: When was the last time?     *No Answer* 7. CHRONIC CONSTIPATION: Is this a new problem for you?  If No, ask: How long have you had this problem? (days, weeks, months)      Positive for chronic constipation  9. MEDICINES: Have you been taking any new medicines? Are you taking any narcotic pain medicines? (e.g., Dilaudid, morphine, Percocet, Vicodin)     denies 10. LAXATIVES: Have you been using any stool softeners, laxatives, or enemas?  If Yes, ask What are you using, how often, and when was the last time?       Has used mag citrate and milk of magnesia without relief. States that she has used lactulose  in the past with  success  12. CAUSE: What do you think is causing the constipation?        Has history of colon rupture and has had problems with constipation since 2013 Homeless for a couple of weeks in December.  Could not use the bathroom when needed Eating schedule has been off too 13. MEDICAL HISTORY: Do you have a history of hemorrhoids, rectal fissures, rectal surgery, or rectal abscess?         Ruptured colon 14. OTHER SYMPTOMS: Do you have any other symptoms? (e.g., abdomen pain, bloating, fever, vomiting)       Nausea, bloating  Protocols used: Constipation-A-AH

## 2024-06-09 NOTE — Telephone Encounter (Signed)
 Called patient regarding constipation, she states:   Abdominal bloating Rectal pain: pressure, feels like bowling ball stuck. Constipation x several weeks Tried OTC  Lactulose  helped last time but was prescribed by ED then   Call continues going in and out for audio. Patient states her phone has been having issues and then call dropped. Attempted outbound call to patient and no answer, LVM.   Patient still needing triage assessment.  Message from [place] sent at 06/09/2024 11:14 AM EST  Summary: [id number]: Pam Chen   Reason for Triage: pt states that she has not had a bowel movement in several weeks has magnesium  citrate and and epsom salt, and bowels has not moved at all

## 2024-06-09 NOTE — Telephone Encounter (Signed)
 Appt made

## 2024-06-09 NOTE — Progress Notes (Signed)
 "  BP 119/83   Pulse 77   Ht 5' 7 (1.702 m)   Wt 204 lb (92.5 kg)   SpO2 98%   BMI 31.95 kg/m    Subjective:   Patient ID: Pam Chen, female    DOB: 1970/09/21, 54 y.o.   MRN: [id number]  HPI: Pam Chen is a 54 y.o. female presenting on 06/09/2024 for Constipation (X4 weeks.) and Abdominal Pain   Discussed the use of AI scribe software for clinical note transcription with the patient, who gave verbal consent to proceed.  History of Present Illness   Pam Chen is a 54 year old female who presents with chronic constipation and abdominal discomfort.  Constipation and abdominal discomfort - Chronic constipation and abdominal discomfort for several weeks - Describes symptoms as a feeling of pressure and stomach ache, with occasional nausea - No sharp abdominal pain - Discomfort primarily located in the lower abdomen - Describes sensation as 'pushing a bowling ball' - No blood in stool - Intermittent passage of gas - Increased water intake to address constipation  Response to laxatives and other treatments - Tried Epsom salt, milk of magnesia, Miralax, stool softeners, and enemas - Treatments have softened stools but have not alleviated abdominal symptoms - Previous hospitalization for similar symptoms; lactulose  was effective but unpleasant in taste  Nutritional and environmental factors - History of homelessness a few weeks before Christmas, resulting in disrupted eating schedule and periods of not eating for days - Believes disrupted nutrition contributed to current bowel issues  Substance use disorder - History of cocaine, heroin, fentanyl , methamphetamine, LSD, and psilocybin use - Abstinent from substances since May 24, 2024 - Actively attending meetings and seeking support for sobriety - Significant life disruptions due to addiction, including loss of housing and neglect of health  Infectious disease screening - Tested for HIV and hepatitis within the last  three months; results negative          Relevant past medical, surgical, family and social history reviewed and updated as indicated. Interim medical history since our last visit reviewed. Allergies and medications reviewed and updated.  Review of Systems  Constitutional:  Negative for chills and fever.  Eyes:  Negative for visual disturbance.  Respiratory:  Negative for chest tightness and shortness of breath.   Cardiovascular:  Negative for chest pain and leg swelling.  Gastrointestinal:  Positive for abdominal distention, abdominal pain, constipation and nausea. Negative for diarrhea and vomiting.  Genitourinary:  Negative for difficulty urinating and dysuria.  Musculoskeletal:  Negative for back pain and gait problem.  Skin:  Negative for rash.  Neurological:  Negative for dizziness, light-headedness and headaches.  Psychiatric/Behavioral:  Negative for agitation and behavioral problems.   All other systems reviewed and are negative.   Per HPI unless specifically indicated above   Allergies as of 06/09/2024       Reactions   Aspirin Other (See Comments), Nausea Only   Upset tummy.  Can take ibuprofen .   Oxycodone-acetaminophen  Anxiety   Can take vicodin   Tramadol Hcl Nausea Only, Other (See Comments)   And headache And headache        Medication List        Accurate as of June 09, 2024  4:14 PM. If you have any questions, ask your nurse or doctor.          STOP taking these medications    doxycycline  100 MG tablet Commonly known as: VIBRA -TABS Stopped by: Fonda Levins, MD   fluticasone -salmeterol  250-50 MCG/ACT Aepb Commonly known as: Advair Diskus Stopped by: Fonda Levins, MD   gabapentin  100 MG capsule Commonly known as: NEURONTIN  Stopped by: Fonda Levins, MD   Suboxone 8-2 MG Film Generic drug: Buprenorphine HCl-Naloxone HCl Stopped by: Fonda Levins, MD       TAKE these medications    albuterol  108 (90 Base) MCG/ACT  inhaler Commonly known as: VENTOLIN  HFA TAKE 1-2 PUFFS AS NEEDED FOR WHEEZING/CHEST TIGHTNESS   ARIPiprazole  30 MG tablet Commonly known as: ABILIFY  Take 1 tablet (30 mg total) by mouth daily.   empagliflozin  10 MG Tabs tablet Commonly known as: Jardiance  Take 1 tablet (10 mg total) by mouth daily before breakfast.   FLUoxetine  20 MG capsule Commonly known as: PROZAC  Take 3 capsules (60 mg total) by mouth daily.   furosemide  20 MG tablet Commonly known as: LASIX  Take 1 tablet (20 mg total) by mouth daily.   ibuprofen  800 MG tablet Commonly known as: ADVIL  TAKE 1 TABLET BY MOUTH 3 TIMES DAILY AS NEEDED.   lactulose  10 GM/15ML solution Commonly known as: CHRONULAC  Take 30 mLs (20 g total) by mouth 2 (two) times daily as needed for mild constipation, moderate constipation or severe constipation. Started by: Fonda Levins, MD   mirabegron  ER 50 MG Tb24 tablet Commonly known as: Myrbetriq  Take 1 tablet (50 mg total) by mouth daily.   omeprazole  20 MG capsule Commonly known as: PRILOSEC Take 1 capsule (20 mg total) by mouth daily. **NEEDS TO BE SEEN BEFORE NEXT REFILL**   ondansetron  4 MG disintegrating tablet Commonly known as: ZOFRAN -ODT Take 1 tablet (4 mg total) by mouth every 8 (eight) hours as needed.   rosuvastatin  10 MG tablet Commonly known as: Crestor  Take 1 tablet (10 mg total) by mouth daily.         Objective:   BP 119/83   Pulse 77   Ht 5' 7 (1.702 m)   Wt 204 lb (92.5 kg)   SpO2 98%   BMI 31.95 kg/m   Wt Readings from Last 3 Encounters:  06/09/24 204 lb (92.5 kg)  10/12/23 200 lb 3.2 oz (90.8 kg)  10/01/23 197 lb (89.4 kg)    Physical Exam Vitals and nursing note reviewed.  Constitutional:      Appearance: She is well-developed.  Abdominal:     General: Bowel sounds are normal.     Palpations: There is no mass.     Tenderness: There is abdominal tenderness in the epigastric area and suprapubic area. There is no right CVA tenderness,  left CVA tenderness, guarding or rebound. Negative signs include Murphy's sign.  Neurological:     Mental Status: She is alert.    Physical Exam   ABDOMEN: Mild tenderness in the mid-abdomen.         Assessment & Plan:   Problem List Items Addressed This Visit       Other   Constipation - Primary       Chronic constipation Pressure and nausea-like stomach ache exacerbated by irregular eating and polysubstance use. Previous treatments had limited success. Lactulose  effective but not preferred due to taste. - Prescribed lactulose . - Continue enemas. - Encouraged increased fluid intake.  Polysubstance use disorder, in early remission In early remission since December 27th, 2024. Previously used multiple substances. Attending meetings, considering spiritual support, no cravings reported. Recent negative HIV and hepatitis tests. - Encouraged continued attendance at meetings. - Advised finding a sponsor and establishing a home group. - Discussed potential for treatment if cravings  return. - Advised follow-up for blood work if not done in six months.          Follow up plan: Return in about 5 weeks (around 07/14/2024), or if symptoms worsen or fail to improve, for Routine checkup and recheck on constipation.  Counseling provided for all of the vaccine components No orders of the defined types were placed in this encounter.   Fonda Levins, MD [facility] 06/09/2024, 4:14 PM     "

## 2024-06-15 ENCOUNTER — Other Ambulatory Visit: Payer: Self-pay | Admitting: Family Medicine

## 2024-06-15 DIAGNOSIS — R1310 Dysphagia, unspecified: Secondary | ICD-10-CM

## 2024-07-17 ENCOUNTER — Ambulatory Visit: Payer: MEDICAID | Admitting: Family Medicine
